# Patient Record
Sex: Female | Born: 1945
Health system: Southern US, Community
[De-identification: ages and names within clinical notes are randomized; demographics above are authoritative.]

## PROBLEM LIST (undated history)

## (undated) DIAGNOSIS — I77810 Thoracic aortic ectasia: Secondary | ICD-10-CM

## (undated) DIAGNOSIS — M1991 Primary osteoarthritis, unspecified site: Secondary | ICD-10-CM

## (undated) DIAGNOSIS — R251 Tremor, unspecified: Secondary | ICD-10-CM

## (undated) DIAGNOSIS — Z85828 Personal history of other malignant neoplasm of skin: Secondary | ICD-10-CM

## (undated) DIAGNOSIS — I10 Essential (primary) hypertension: Secondary | ICD-10-CM

## (undated) DIAGNOSIS — I517 Cardiomegaly: Secondary | ICD-10-CM

## (undated) DIAGNOSIS — I340 Nonrheumatic mitral (valve) insufficiency: Secondary | ICD-10-CM

## (undated) DIAGNOSIS — Z860100 Personal history of colon polyps, unspecified: Secondary | ICD-10-CM

## (undated) DIAGNOSIS — D508 Other iron deficiency anemias: Secondary | ICD-10-CM

## (undated) DIAGNOSIS — H905 Unspecified sensorineural hearing loss: Secondary | ICD-10-CM

## (undated) DIAGNOSIS — E042 Nontoxic multinodular goiter: Secondary | ICD-10-CM

## (undated) DIAGNOSIS — IMO0002 Reserved for concepts with insufficient information to code with codable children: Secondary | ICD-10-CM

## (undated) DIAGNOSIS — K9289 Other specified diseases of the digestive system: Secondary | ICD-10-CM

## (undated) DIAGNOSIS — I272 Pulmonary hypertension, unspecified: Secondary | ICD-10-CM

## (undated) DIAGNOSIS — K769 Liver disease, unspecified: Secondary | ICD-10-CM

## (undated) DIAGNOSIS — M419 Scoliosis, unspecified: Secondary | ICD-10-CM

## (undated) DIAGNOSIS — E559 Vitamin D deficiency, unspecified: Secondary | ICD-10-CM

## (undated) DIAGNOSIS — K760 Fatty (change of) liver, not elsewhere classified: Secondary | ICD-10-CM

## (undated) DIAGNOSIS — E7800 Pure hypercholesterolemia, unspecified: Secondary | ICD-10-CM

## (undated) DIAGNOSIS — N750 Cyst of Bartholin's gland: Secondary | ICD-10-CM

## (undated) DIAGNOSIS — I071 Rheumatic tricuspid insufficiency: Secondary | ICD-10-CM

## (undated) DIAGNOSIS — M859 Disorder of bone density and structure, unspecified: Secondary | ICD-10-CM

## (undated) DIAGNOSIS — H9190 Unspecified hearing loss, unspecified ear: Secondary | ICD-10-CM

## (undated) DIAGNOSIS — D649 Anemia, unspecified: Secondary | ICD-10-CM

## (undated) DIAGNOSIS — E669 Obesity, unspecified: Secondary | ICD-10-CM

## (undated) DIAGNOSIS — K5900 Constipation, unspecified: Secondary | ICD-10-CM

## (undated) DIAGNOSIS — Z8719 Personal history of other diseases of the digestive system: Secondary | ICD-10-CM

## (undated) HISTORY — DX: Other specified diseases of the digestive system: K92.89

## (undated) HISTORY — PX: CATARACT EXTRACTION: SUR2

## (undated) HISTORY — PX: BREAST REDUCTION SURGERY: SHX8

## (undated) HISTORY — PX: ROTATOR CUFF REPAIR: SHX139

## (undated) HISTORY — PX: HIATAL HERNIA REPAIR: SHX195

## (undated) HISTORY — PX: CARPAL TUNNEL RELEASE: SHX101

## (undated) HISTORY — PX: UMBILICAL HERNIA REPAIR: SHX196

## (undated) HISTORY — PX: STAPEDECTOMY: SHX2435

## (undated) HISTORY — PX: APPENDECTOMY: SHX54

## (undated) HISTORY — PX: BREAST SURGERY: SHX581

---

## 1997-07-20 ENCOUNTER — Other Ambulatory Visit: Admission: RE | Admit: 1997-07-20 | Discharge: 1997-07-20 | Payer: Self-pay | Admitting: *Deleted

## 1997-12-25 ENCOUNTER — Other Ambulatory Visit: Admission: RE | Admit: 1997-12-25 | Discharge: 1997-12-25 | Payer: Self-pay | Admitting: *Deleted

## 1998-02-06 ENCOUNTER — Other Ambulatory Visit: Admission: RE | Admit: 1998-02-06 | Discharge: 1998-02-06 | Payer: Self-pay | Admitting: *Deleted

## 1998-03-01 ENCOUNTER — Ambulatory Visit (HOSPITAL_COMMUNITY): Admission: RE | Admit: 1998-03-01 | Discharge: 1998-03-01 | Payer: Self-pay | Admitting: Gastroenterology

## 1999-02-07 ENCOUNTER — Other Ambulatory Visit: Admission: RE | Admit: 1999-02-07 | Discharge: 1999-02-07 | Payer: Self-pay | Admitting: *Deleted

## 2000-03-26 ENCOUNTER — Other Ambulatory Visit: Admission: RE | Admit: 2000-03-26 | Discharge: 2000-03-26 | Payer: Self-pay | Admitting: *Deleted

## 2001-02-23 ENCOUNTER — Encounter (INDEPENDENT_AMBULATORY_CARE_PROVIDER_SITE_OTHER): Payer: Self-pay | Admitting: Specialist

## 2001-02-23 ENCOUNTER — Ambulatory Visit (HOSPITAL_BASED_OUTPATIENT_CLINIC_OR_DEPARTMENT_OTHER): Admission: RE | Admit: 2001-02-23 | Discharge: 2001-02-23 | Payer: Self-pay | Admitting: Plastic Surgery

## 2001-03-17 ENCOUNTER — Other Ambulatory Visit: Admission: RE | Admit: 2001-03-17 | Discharge: 2001-03-17 | Payer: Self-pay | Admitting: *Deleted

## 2001-04-05 ENCOUNTER — Encounter (INDEPENDENT_AMBULATORY_CARE_PROVIDER_SITE_OTHER): Payer: Self-pay

## 2001-04-05 ENCOUNTER — Ambulatory Visit (HOSPITAL_COMMUNITY): Admission: RE | Admit: 2001-04-05 | Discharge: 2001-04-05 | Payer: Self-pay | Admitting: Gastroenterology

## 2002-04-21 ENCOUNTER — Other Ambulatory Visit: Admission: RE | Admit: 2002-04-21 | Discharge: 2002-04-21 | Payer: Self-pay | Admitting: *Deleted

## 2003-08-20 ENCOUNTER — Other Ambulatory Visit: Admission: RE | Admit: 2003-08-20 | Discharge: 2003-08-20 | Payer: Self-pay | Admitting: Family Medicine

## 2004-04-09 ENCOUNTER — Observation Stay (HOSPITAL_COMMUNITY): Admission: EM | Admit: 2004-04-09 | Discharge: 2004-04-10 | Payer: Self-pay | Admitting: Emergency Medicine

## 2005-10-15 ENCOUNTER — Other Ambulatory Visit: Admission: RE | Admit: 2005-10-15 | Discharge: 2005-10-15 | Payer: Self-pay | Admitting: *Deleted

## 2007-01-19 ENCOUNTER — Other Ambulatory Visit: Admission: RE | Admit: 2007-01-19 | Discharge: 2007-01-19 | Payer: Self-pay | Admitting: *Deleted

## 2008-04-04 ENCOUNTER — Emergency Department (HOSPITAL_COMMUNITY): Admission: EM | Admit: 2008-04-04 | Discharge: 2008-04-04 | Payer: Self-pay | Admitting: Emergency Medicine

## 2008-06-28 ENCOUNTER — Encounter: Admission: RE | Admit: 2008-06-28 | Discharge: 2008-06-28 | Payer: Self-pay | Admitting: Orthopedic Surgery

## 2008-08-21 ENCOUNTER — Other Ambulatory Visit: Admission: RE | Admit: 2008-08-21 | Discharge: 2008-08-21 | Payer: Self-pay | Admitting: Obstetrics and Gynecology

## 2010-05-15 LAB — D-DIMER, QUANTITATIVE: D-Dimer, Quant: 0.24 ug/mL-FEU (ref 0.00–0.48)

## 2010-05-15 LAB — DIFFERENTIAL
Basophils Absolute: 0 10*3/uL (ref 0.0–0.1)
Basophils Relative: 0 % (ref 0–1)
Eosinophils Absolute: 0.1 10*3/uL (ref 0.0–0.7)
Eosinophils Relative: 2 % (ref 0–5)
Lymphocytes Relative: 25 % (ref 12–46)
Lymphs Abs: 1 10*3/uL (ref 0.7–4.0)
Monocytes Absolute: 0.3 10*3/uL (ref 0.1–1.0)
Monocytes Relative: 8 % (ref 3–12)
Neutro Abs: 2.6 10*3/uL (ref 1.7–7.7)
Neutrophils Relative %: 65 % (ref 43–77)

## 2010-05-15 LAB — CBC
HCT: 36.4 % (ref 36.0–46.0)
Hemoglobin: 12.4 g/dL (ref 12.0–15.0)
MCHC: 34 g/dL (ref 30.0–36.0)
MCV: 92.6 fL (ref 78.0–100.0)
Platelets: 301 10*3/uL (ref 150–400)
RBC: 3.93 MIL/uL (ref 3.87–5.11)
RDW: 12.3 % (ref 11.5–15.5)
WBC: 4 10*3/uL (ref 4.0–10.5)

## 2010-05-15 LAB — POCT I-STAT, CHEM 8
BUN: 26 mg/dL — ABNORMAL HIGH (ref 6–23)
Chloride: 107 mEq/L (ref 96–112)
Glucose, Bld: 100 mg/dL — ABNORMAL HIGH (ref 70–99)
Sodium: 140 mEq/L (ref 135–145)
TCO2: 24 mmol/L (ref 0–100)

## 2010-06-20 NOTE — H&P (Signed)
[name], [name]              ACCOUNT NO.:  [id number]   MEDICAL RECORD NO.:  [id number]          PATIENT TYPE:  EMS   LOCATION:  [facility]                         FACILITY:  [facility]   PHYSICIAN:  Hollice Espy, M.D.DATE OF BIRTH:  10/01/1945   DATE OF ADMISSION:  04/09/2004  DATE OF DISCHARGE:                                HISTORY & PHYSICAL   PRIMARY CARE PHYSICIAN:  Al Decant. Janey Greaser, M.D.   CHIEF COMPLAINT:  Chest discomfort.   HISTORY OF PRESENT ILLNESS:  The patient is a 65 year old white female with  past medical history of GERD, who a night prior to admission was  experiencing some burning in her chest which was relieved after taking  Rolaids.  She went to bed and this morning she woke up with a different  sensation in her chest and more of a chest tightness, somewhat described as  pressure causing some shortness of breath.  She felt extremely lightheaded  and came into the emergency room for further evaluation.  Here in the  emergency room, she had an EKG done which showed normal sinus rhythm, no ST  or T wave changes.  Cardiac enzymes were drawn and found to be normal as  well as were ISTAT labs.  The patient was given nitroglycerin, oxygen, and  aspirin.  She said her symptoms resolved, although, she could not say which  intervention caused the relief.  Currently she states she is doing well.  She has no chest discomfort or chest tightness.  Her only complaint is of a  headache caused by the nitroglycerin.  She denies any visual changes,  nausea, vomiting, chest pain, palpitations, shortness of breath, wheeze,  cough, abdominal pain, hematuria, dysuria, constipation, diarrhea, focal  extremity pain, weakness, or numbness.  Review of systems is otherwise  negative.   PAST MEDICAL HISTORY:  Notable for GERD, otherwise unremarkable.   MEDICATIONS:  None.   ALLERGIES:  No known drug allergies.   SOCIAL HISTORY:  She denies any alcohol, tobacco, or drug use.   FAMILY HISTORY:  Father who had an MI at age 55.   PHYSICAL EXAMINATION:  VITAL SIGNS:  Temperature 96.8, respirations 20,  heart rate 79, blood pressure 139/88, O2 saturation 98% on room air.  GENERAL:  The patient appears to be alert and oriented x3.  In no acute  distress.  HEENT:  Normocephalic and atraumatic.  Mucous membranes are moist.  She has  no carotid bruits.  HEART:  Regular rate and rhythm, S1 and S2.  LUNGS:  Clear to auscultation bilaterally.  ABDOMEN:  Soft, nontender, and nondistended.  Positive bowel sounds.  EXTREMITIES:  No cyanosis, clubbing, or edema.   LABORATORY DATA:  White count 5.9, H&H 12.9 and 36.8, MCV 90, platelet count  303.  CMET is completely normal.  Normal electrolytes and normal LFT's.  PT,  PTT, and INR are all normal.  First set of cardiac markers 81.7, MB 1.6,  second 72.9 and MB 1.6.  Troponin I less than 0.05.   Chest x-ray shows evidence of mild cardiomegaly.  The patient notes that she  follows up  regularly with her PCP, has had her cholesterol checked and has  been told that it is low and has had her blood pressure checked often and is  always found to be normal.   ASSESSMENT:  Chest discomfort.  We will rule out cardiac etiology by  checking two more sets of cardiac enzymes.  EKG is normal sinus rhythm.  I  have a low index of suspicion despite her age given the patient's frequent  checkups and no other risk factors.  If her cardiac enzymes is negative  would favor discharging home with an outpatient stress test, but will see  how she does in the morning.      SKK/MEDQ  D:  04/09/2004  T:  04/10/2004  Job:  [id number]   cc:   Al Decant. Janey Greaser, MD  [address]  [place]  [place] [address]  Fax: [phone]

## 2010-07-21 ENCOUNTER — Other Ambulatory Visit: Payer: Self-pay | Admitting: Dermatology

## 2010-10-14 ENCOUNTER — Other Ambulatory Visit: Payer: Self-pay | Admitting: Dermatology

## 2011-02-09 DIAGNOSIS — M949 Disorder of cartilage, unspecified: Secondary | ICD-10-CM | POA: Diagnosis not present

## 2011-02-09 DIAGNOSIS — M899 Disorder of bone, unspecified: Secondary | ICD-10-CM | POA: Diagnosis not present

## 2011-02-11 ENCOUNTER — Other Ambulatory Visit: Payer: Self-pay | Admitting: Dermatology

## 2011-02-11 DIAGNOSIS — C44529 Squamous cell carcinoma of skin of other part of trunk: Secondary | ICD-10-CM | POA: Diagnosis not present

## 2011-02-11 DIAGNOSIS — L570 Actinic keratosis: Secondary | ICD-10-CM | POA: Diagnosis not present

## 2011-02-11 DIAGNOSIS — L821 Other seborrheic keratosis: Secondary | ICD-10-CM | POA: Diagnosis not present

## 2011-02-18 ENCOUNTER — Other Ambulatory Visit: Payer: Self-pay | Admitting: Dermatology

## 2011-02-18 DIAGNOSIS — L570 Actinic keratosis: Secondary | ICD-10-CM | POA: Diagnosis not present

## 2011-02-18 DIAGNOSIS — D045 Carcinoma in situ of skin of trunk: Secondary | ICD-10-CM | POA: Diagnosis not present

## 2011-02-18 DIAGNOSIS — C44529 Squamous cell carcinoma of skin of other part of trunk: Secondary | ICD-10-CM | POA: Diagnosis not present

## 2011-08-12 ENCOUNTER — Other Ambulatory Visit: Payer: Self-pay | Admitting: Dermatology

## 2011-08-12 DIAGNOSIS — L570 Actinic keratosis: Secondary | ICD-10-CM | POA: Diagnosis not present

## 2011-08-12 DIAGNOSIS — L821 Other seborrheic keratosis: Secondary | ICD-10-CM | POA: Diagnosis not present

## 2011-08-12 DIAGNOSIS — D485 Neoplasm of uncertain behavior of skin: Secondary | ICD-10-CM | POA: Diagnosis not present

## 2011-08-12 DIAGNOSIS — C44519 Basal cell carcinoma of skin of other part of trunk: Secondary | ICD-10-CM | POA: Diagnosis not present

## 2011-09-18 DIAGNOSIS — H1030 Unspecified acute conjunctivitis, unspecified eye: Secondary | ICD-10-CM | POA: Diagnosis not present

## 2011-09-24 DIAGNOSIS — H8000 Otosclerosis involving oval window, nonobliterative, unspecified ear: Secondary | ICD-10-CM | POA: Diagnosis not present

## 2011-09-24 DIAGNOSIS — H908 Mixed conductive and sensorineural hearing loss, unspecified: Secondary | ICD-10-CM | POA: Diagnosis not present

## 2011-09-24 DIAGNOSIS — H905 Unspecified sensorineural hearing loss: Secondary | ICD-10-CM | POA: Diagnosis not present

## 2011-11-04 DIAGNOSIS — Z23 Encounter for immunization: Secondary | ICD-10-CM | POA: Diagnosis not present

## 2011-11-24 DIAGNOSIS — L570 Actinic keratosis: Secondary | ICD-10-CM | POA: Diagnosis not present

## 2011-12-09 DIAGNOSIS — H2589 Other age-related cataract: Secondary | ICD-10-CM | POA: Diagnosis not present

## 2011-12-09 DIAGNOSIS — H524 Presbyopia: Secondary | ICD-10-CM | POA: Diagnosis not present

## 2011-12-09 DIAGNOSIS — H52229 Regular astigmatism, unspecified eye: Secondary | ICD-10-CM | POA: Diagnosis not present

## 2011-12-09 DIAGNOSIS — H5200 Hypermetropia, unspecified eye: Secondary | ICD-10-CM | POA: Diagnosis not present

## 2012-02-16 ENCOUNTER — Other Ambulatory Visit: Payer: Self-pay | Admitting: Dermatology

## 2012-02-16 DIAGNOSIS — L821 Other seborrheic keratosis: Secondary | ICD-10-CM | POA: Diagnosis not present

## 2012-02-16 DIAGNOSIS — L570 Actinic keratosis: Secondary | ICD-10-CM | POA: Diagnosis not present

## 2012-02-16 DIAGNOSIS — D1801 Hemangioma of skin and subcutaneous tissue: Secondary | ICD-10-CM | POA: Diagnosis not present

## 2012-02-16 DIAGNOSIS — D485 Neoplasm of uncertain behavior of skin: Secondary | ICD-10-CM | POA: Diagnosis not present

## 2012-02-22 DIAGNOSIS — J019 Acute sinusitis, unspecified: Secondary | ICD-10-CM | POA: Diagnosis not present

## 2012-02-22 DIAGNOSIS — J209 Acute bronchitis, unspecified: Secondary | ICD-10-CM | POA: Diagnosis not present

## 2012-04-07 ENCOUNTER — Other Ambulatory Visit: Payer: Self-pay | Admitting: Family Medicine

## 2012-04-07 ENCOUNTER — Other Ambulatory Visit (HOSPITAL_COMMUNITY)
Admission: RE | Admit: 2012-04-07 | Discharge: 2012-04-07 | Disposition: A | Discharge status: Home / Self Care | Payer: Medicare Other | Source: Ambulatory Visit | Attending: Family Medicine | Admitting: Family Medicine

## 2012-04-07 DIAGNOSIS — Z131 Encounter for screening for diabetes mellitus: Secondary | ICD-10-CM | POA: Diagnosis not present

## 2012-04-07 DIAGNOSIS — E7800 Pure hypercholesterolemia, unspecified: Secondary | ICD-10-CM | POA: Diagnosis not present

## 2012-04-07 DIAGNOSIS — Z124 Encounter for screening for malignant neoplasm of cervix: Secondary | ICD-10-CM | POA: Insufficient documentation

## 2012-04-07 DIAGNOSIS — Z1151 Encounter for screening for human papillomavirus (HPV): Secondary | ICD-10-CM | POA: Insufficient documentation

## 2012-04-07 DIAGNOSIS — M949 Disorder of cartilage, unspecified: Secondary | ICD-10-CM | POA: Diagnosis not present

## 2012-04-07 DIAGNOSIS — Z0000 Encounter for general adult medical examination without abnormal findings: Secondary | ICD-10-CM | POA: Diagnosis not present

## 2012-04-07 DIAGNOSIS — E663 Overweight: Secondary | ICD-10-CM | POA: Diagnosis not present

## 2012-04-07 DIAGNOSIS — M899 Disorder of bone, unspecified: Secondary | ICD-10-CM | POA: Diagnosis not present

## 2012-06-28 DIAGNOSIS — M5430 Sciatica, unspecified side: Secondary | ICD-10-CM | POA: Diagnosis not present

## 2012-06-28 DIAGNOSIS — M999 Biomechanical lesion, unspecified: Secondary | ICD-10-CM | POA: Diagnosis not present

## 2012-06-29 DIAGNOSIS — M5430 Sciatica, unspecified side: Secondary | ICD-10-CM | POA: Diagnosis not present

## 2012-06-29 DIAGNOSIS — M999 Biomechanical lesion, unspecified: Secondary | ICD-10-CM | POA: Diagnosis not present

## 2012-06-30 DIAGNOSIS — M5430 Sciatica, unspecified side: Secondary | ICD-10-CM | POA: Diagnosis not present

## 2012-06-30 DIAGNOSIS — M999 Biomechanical lesion, unspecified: Secondary | ICD-10-CM | POA: Diagnosis not present

## 2012-07-04 DIAGNOSIS — M999 Biomechanical lesion, unspecified: Secondary | ICD-10-CM | POA: Diagnosis not present

## 2012-07-04 DIAGNOSIS — M5430 Sciatica, unspecified side: Secondary | ICD-10-CM | POA: Diagnosis not present

## 2012-07-05 DIAGNOSIS — M5430 Sciatica, unspecified side: Secondary | ICD-10-CM | POA: Diagnosis not present

## 2012-07-05 DIAGNOSIS — M999 Biomechanical lesion, unspecified: Secondary | ICD-10-CM | POA: Diagnosis not present

## 2012-07-06 DIAGNOSIS — M5430 Sciatica, unspecified side: Secondary | ICD-10-CM | POA: Diagnosis not present

## 2012-07-06 DIAGNOSIS — M999 Biomechanical lesion, unspecified: Secondary | ICD-10-CM | POA: Diagnosis not present

## 2012-07-11 DIAGNOSIS — M999 Biomechanical lesion, unspecified: Secondary | ICD-10-CM | POA: Diagnosis not present

## 2012-07-11 DIAGNOSIS — M5430 Sciatica, unspecified side: Secondary | ICD-10-CM | POA: Diagnosis not present

## 2012-07-12 DIAGNOSIS — M999 Biomechanical lesion, unspecified: Secondary | ICD-10-CM | POA: Diagnosis not present

## 2012-07-12 DIAGNOSIS — M5430 Sciatica, unspecified side: Secondary | ICD-10-CM | POA: Diagnosis not present

## 2012-07-14 DIAGNOSIS — M5430 Sciatica, unspecified side: Secondary | ICD-10-CM | POA: Diagnosis not present

## 2012-07-14 DIAGNOSIS — M999 Biomechanical lesion, unspecified: Secondary | ICD-10-CM | POA: Diagnosis not present

## 2012-07-18 DIAGNOSIS — M5430 Sciatica, unspecified side: Secondary | ICD-10-CM | POA: Diagnosis not present

## 2012-07-18 DIAGNOSIS — M999 Biomechanical lesion, unspecified: Secondary | ICD-10-CM | POA: Diagnosis not present

## 2012-07-19 DIAGNOSIS — M5430 Sciatica, unspecified side: Secondary | ICD-10-CM | POA: Diagnosis not present

## 2012-07-19 DIAGNOSIS — M999 Biomechanical lesion, unspecified: Secondary | ICD-10-CM | POA: Diagnosis not present

## 2012-07-20 DIAGNOSIS — M5430 Sciatica, unspecified side: Secondary | ICD-10-CM | POA: Diagnosis not present

## 2012-07-20 DIAGNOSIS — M999 Biomechanical lesion, unspecified: Secondary | ICD-10-CM | POA: Diagnosis not present

## 2012-08-01 DIAGNOSIS — M999 Biomechanical lesion, unspecified: Secondary | ICD-10-CM | POA: Diagnosis not present

## 2012-08-01 DIAGNOSIS — M5430 Sciatica, unspecified side: Secondary | ICD-10-CM | POA: Diagnosis not present

## 2012-08-08 DIAGNOSIS — M5430 Sciatica, unspecified side: Secondary | ICD-10-CM | POA: Diagnosis not present

## 2012-08-08 DIAGNOSIS — M999 Biomechanical lesion, unspecified: Secondary | ICD-10-CM | POA: Diagnosis not present

## 2012-08-16 ENCOUNTER — Other Ambulatory Visit: Payer: Self-pay | Admitting: Dermatology

## 2012-08-16 DIAGNOSIS — L821 Other seborrheic keratosis: Secondary | ICD-10-CM | POA: Diagnosis not present

## 2012-08-16 DIAGNOSIS — D485 Neoplasm of uncertain behavior of skin: Secondary | ICD-10-CM | POA: Diagnosis not present

## 2012-08-16 DIAGNOSIS — Z85828 Personal history of other malignant neoplasm of skin: Secondary | ICD-10-CM | POA: Diagnosis not present

## 2012-08-16 DIAGNOSIS — D0460 Carcinoma in situ of skin of unspecified upper limb, including shoulder: Secondary | ICD-10-CM | POA: Diagnosis not present

## 2012-08-16 DIAGNOSIS — L723 Sebaceous cyst: Secondary | ICD-10-CM | POA: Diagnosis not present

## 2012-08-16 DIAGNOSIS — L570 Actinic keratosis: Secondary | ICD-10-CM | POA: Diagnosis not present

## 2012-08-16 DIAGNOSIS — D044 Carcinoma in situ of skin of scalp and neck: Secondary | ICD-10-CM | POA: Diagnosis not present

## 2012-10-05 DIAGNOSIS — R0609 Other forms of dyspnea: Secondary | ICD-10-CM | POA: Diagnosis not present

## 2012-10-07 ENCOUNTER — Other Ambulatory Visit: Payer: Self-pay | Admitting: Family Medicine

## 2012-10-07 ENCOUNTER — Ambulatory Visit
Admission: RE | Admit: 2012-10-07 | Discharge: 2012-10-07 | Disposition: A | Discharge status: Home / Self Care | Payer: Medicare Other | Source: Ambulatory Visit | Attending: Family Medicine | Admitting: Family Medicine

## 2012-10-07 DIAGNOSIS — R7989 Other specified abnormal findings of blood chemistry: Secondary | ICD-10-CM

## 2012-10-07 DIAGNOSIS — R0602 Shortness of breath: Secondary | ICD-10-CM

## 2012-10-07 DIAGNOSIS — I2510 Atherosclerotic heart disease of native coronary artery without angina pectoris: Secondary | ICD-10-CM | POA: Diagnosis not present

## 2012-10-07 MED ORDER — IOHEXOL 350 MG/ML SOLN
125.0000 mL | Freq: Once | INTRAVENOUS | Status: AC | PRN
  Administered 2012-10-07: 125 mL via INTRAVENOUS

## 2012-10-11 DIAGNOSIS — Z1211 Encounter for screening for malignant neoplasm of colon: Secondary | ICD-10-CM | POA: Diagnosis not present

## 2012-10-21 DIAGNOSIS — D509 Iron deficiency anemia, unspecified: Secondary | ICD-10-CM | POA: Diagnosis not present

## 2012-10-21 DIAGNOSIS — R195 Other fecal abnormalities: Secondary | ICD-10-CM | POA: Diagnosis not present

## 2012-10-21 DIAGNOSIS — Z0000 Encounter for general adult medical examination without abnormal findings: Secondary | ICD-10-CM | POA: Diagnosis not present

## 2012-10-24 DIAGNOSIS — K921 Melena: Secondary | ICD-10-CM | POA: Diagnosis not present

## 2012-10-24 DIAGNOSIS — D509 Iron deficiency anemia, unspecified: Secondary | ICD-10-CM | POA: Diagnosis not present

## 2012-10-24 DIAGNOSIS — R195 Other fecal abnormalities: Secondary | ICD-10-CM | POA: Diagnosis not present

## 2012-10-24 DIAGNOSIS — K259 Gastric ulcer, unspecified as acute or chronic, without hemorrhage or perforation: Secondary | ICD-10-CM | POA: Diagnosis not present

## 2012-10-24 DIAGNOSIS — K2970 Gastritis, unspecified, without bleeding: Secondary | ICD-10-CM | POA: Diagnosis not present

## 2012-10-24 DIAGNOSIS — D649 Anemia, unspecified: Secondary | ICD-10-CM | POA: Diagnosis not present

## 2012-10-24 DIAGNOSIS — K2940 Chronic atrophic gastritis without bleeding: Secondary | ICD-10-CM | POA: Diagnosis not present

## 2012-11-09 DIAGNOSIS — H908 Mixed conductive and sensorineural hearing loss, unspecified: Secondary | ICD-10-CM | POA: Diagnosis not present

## 2012-11-09 DIAGNOSIS — H905 Unspecified sensorineural hearing loss: Secondary | ICD-10-CM | POA: Diagnosis not present

## 2012-11-09 DIAGNOSIS — H8000 Otosclerosis involving oval window, nonobliterative, unspecified ear: Secondary | ICD-10-CM | POA: Diagnosis not present

## 2012-11-09 DIAGNOSIS — H6120 Impacted cerumen, unspecified ear: Secondary | ICD-10-CM | POA: Diagnosis not present

## 2012-11-28 DIAGNOSIS — D509 Iron deficiency anemia, unspecified: Secondary | ICD-10-CM | POA: Diagnosis not present

## 2012-11-29 DIAGNOSIS — Z23 Encounter for immunization: Secondary | ICD-10-CM | POA: Diagnosis not present

## 2012-12-27 DIAGNOSIS — D509 Iron deficiency anemia, unspecified: Secondary | ICD-10-CM | POA: Diagnosis not present

## 2013-01-09 DIAGNOSIS — K253 Acute gastric ulcer without hemorrhage or perforation: Secondary | ICD-10-CM | POA: Diagnosis not present

## 2013-01-09 DIAGNOSIS — Z8719 Personal history of other diseases of the digestive system: Secondary | ICD-10-CM | POA: Diagnosis not present

## 2013-01-09 DIAGNOSIS — K259 Gastric ulcer, unspecified as acute or chronic, without hemorrhage or perforation: Secondary | ICD-10-CM | POA: Diagnosis not present

## 2013-02-14 DIAGNOSIS — Z85828 Personal history of other malignant neoplasm of skin: Secondary | ICD-10-CM | POA: Diagnosis not present

## 2013-02-14 DIAGNOSIS — L909 Atrophic disorder of skin, unspecified: Secondary | ICD-10-CM | POA: Diagnosis not present

## 2013-02-14 DIAGNOSIS — L570 Actinic keratosis: Secondary | ICD-10-CM | POA: Diagnosis not present

## 2013-02-14 DIAGNOSIS — L821 Other seborrheic keratosis: Secondary | ICD-10-CM | POA: Diagnosis not present

## 2013-02-14 DIAGNOSIS — L820 Inflamed seborrheic keratosis: Secondary | ICD-10-CM | POA: Diagnosis not present

## 2013-03-29 ENCOUNTER — Other Ambulatory Visit: Payer: Self-pay | Admitting: Dermatology

## 2013-03-29 DIAGNOSIS — C44319 Basal cell carcinoma of skin of other parts of face: Secondary | ICD-10-CM | POA: Diagnosis not present

## 2013-03-29 DIAGNOSIS — C44721 Squamous cell carcinoma of skin of unspecified lower limb, including hip: Secondary | ICD-10-CM | POA: Diagnosis not present

## 2013-03-29 DIAGNOSIS — L570 Actinic keratosis: Secondary | ICD-10-CM | POA: Diagnosis not present

## 2013-03-29 DIAGNOSIS — D485 Neoplasm of uncertain behavior of skin: Secondary | ICD-10-CM | POA: Diagnosis not present

## 2013-04-11 DIAGNOSIS — C44319 Basal cell carcinoma of skin of other parts of face: Secondary | ICD-10-CM | POA: Diagnosis not present

## 2013-04-11 DIAGNOSIS — Z85828 Personal history of other malignant neoplasm of skin: Secondary | ICD-10-CM | POA: Diagnosis not present

## 2013-04-13 DIAGNOSIS — H52229 Regular astigmatism, unspecified eye: Secondary | ICD-10-CM | POA: Diagnosis not present

## 2013-04-13 DIAGNOSIS — H35369 Drusen (degenerative) of macula, unspecified eye: Secondary | ICD-10-CM | POA: Diagnosis not present

## 2013-04-13 DIAGNOSIS — H2589 Other age-related cataract: Secondary | ICD-10-CM | POA: Diagnosis not present

## 2013-04-13 DIAGNOSIS — H5200 Hypermetropia, unspecified eye: Secondary | ICD-10-CM | POA: Diagnosis not present

## 2013-06-14 DIAGNOSIS — M949 Disorder of cartilage, unspecified: Secondary | ICD-10-CM | POA: Diagnosis not present

## 2013-06-14 DIAGNOSIS — Z1231 Encounter for screening mammogram for malignant neoplasm of breast: Secondary | ICD-10-CM | POA: Diagnosis not present

## 2013-06-14 DIAGNOSIS — M899 Disorder of bone, unspecified: Secondary | ICD-10-CM | POA: Diagnosis not present

## 2013-06-14 DIAGNOSIS — Z803 Family history of malignant neoplasm of breast: Secondary | ICD-10-CM | POA: Diagnosis not present

## 2013-06-30 DIAGNOSIS — E559 Vitamin D deficiency, unspecified: Secondary | ICD-10-CM | POA: Diagnosis not present

## 2013-06-30 DIAGNOSIS — Z131 Encounter for screening for diabetes mellitus: Secondary | ICD-10-CM | POA: Diagnosis not present

## 2013-06-30 DIAGNOSIS — E7800 Pure hypercholesterolemia, unspecified: Secondary | ICD-10-CM | POA: Diagnosis not present

## 2013-07-05 DIAGNOSIS — Z0000 Encounter for general adult medical examination without abnormal findings: Secondary | ICD-10-CM | POA: Diagnosis not present

## 2013-07-05 DIAGNOSIS — M899 Disorder of bone, unspecified: Secondary | ICD-10-CM | POA: Diagnosis not present

## 2013-07-05 DIAGNOSIS — E663 Overweight: Secondary | ICD-10-CM | POA: Diagnosis not present

## 2013-07-05 DIAGNOSIS — D649 Anemia, unspecified: Secondary | ICD-10-CM | POA: Diagnosis not present

## 2013-07-05 DIAGNOSIS — Z23 Encounter for immunization: Secondary | ICD-10-CM | POA: Diagnosis not present

## 2013-08-15 ENCOUNTER — Other Ambulatory Visit: Payer: Self-pay | Admitting: Dermatology

## 2013-08-15 DIAGNOSIS — D1801 Hemangioma of skin and subcutaneous tissue: Secondary | ICD-10-CM | POA: Diagnosis not present

## 2013-08-15 DIAGNOSIS — C4441 Basal cell carcinoma of skin of scalp and neck: Secondary | ICD-10-CM | POA: Diagnosis not present

## 2013-08-15 DIAGNOSIS — B353 Tinea pedis: Secondary | ICD-10-CM | POA: Diagnosis not present

## 2013-08-15 DIAGNOSIS — L570 Actinic keratosis: Secondary | ICD-10-CM | POA: Diagnosis not present

## 2013-08-15 DIAGNOSIS — L821 Other seborrheic keratosis: Secondary | ICD-10-CM | POA: Diagnosis not present

## 2013-08-15 DIAGNOSIS — D485 Neoplasm of uncertain behavior of skin: Secondary | ICD-10-CM | POA: Diagnosis not present

## 2013-08-15 DIAGNOSIS — Z85828 Personal history of other malignant neoplasm of skin: Secondary | ICD-10-CM | POA: Diagnosis not present

## 2013-08-19 DIAGNOSIS — B309 Viral conjunctivitis, unspecified: Secondary | ICD-10-CM | POA: Diagnosis not present

## 2013-09-07 DIAGNOSIS — C44319 Basal cell carcinoma of skin of other parts of face: Secondary | ICD-10-CM | POA: Diagnosis not present

## 2013-09-07 DIAGNOSIS — Z85828 Personal history of other malignant neoplasm of skin: Secondary | ICD-10-CM | POA: Diagnosis not present

## 2013-11-18 DIAGNOSIS — Z23 Encounter for immunization: Secondary | ICD-10-CM | POA: Diagnosis not present

## 2013-12-15 DIAGNOSIS — H2513 Age-related nuclear cataract, bilateral: Secondary | ICD-10-CM | POA: Diagnosis not present

## 2013-12-25 DIAGNOSIS — D649 Anemia, unspecified: Secondary | ICD-10-CM | POA: Diagnosis not present

## 2013-12-25 DIAGNOSIS — K098 Other cysts of oral region, not elsewhere classified: Secondary | ICD-10-CM | POA: Diagnosis not present

## 2013-12-27 DIAGNOSIS — H6123 Impacted cerumen, bilateral: Secondary | ICD-10-CM | POA: Diagnosis not present

## 2013-12-27 DIAGNOSIS — H903 Sensorineural hearing loss, bilateral: Secondary | ICD-10-CM | POA: Diagnosis not present

## 2013-12-27 DIAGNOSIS — H8003 Otosclerosis involving oval window, nonobliterative, bilateral: Secondary | ICD-10-CM | POA: Diagnosis not present

## 2014-02-20 DIAGNOSIS — H6123 Impacted cerumen, bilateral: Secondary | ICD-10-CM | POA: Diagnosis not present

## 2014-02-20 DIAGNOSIS — H903 Sensorineural hearing loss, bilateral: Secondary | ICD-10-CM | POA: Diagnosis not present

## 2014-02-20 DIAGNOSIS — H8003 Otosclerosis involving oval window, nonobliterative, bilateral: Secondary | ICD-10-CM | POA: Diagnosis not present

## 2014-02-21 DIAGNOSIS — L570 Actinic keratosis: Secondary | ICD-10-CM | POA: Diagnosis not present

## 2014-02-21 DIAGNOSIS — L821 Other seborrheic keratosis: Secondary | ICD-10-CM | POA: Diagnosis not present

## 2014-02-21 DIAGNOSIS — Z85828 Personal history of other malignant neoplasm of skin: Secondary | ICD-10-CM | POA: Diagnosis not present

## 2014-03-23 DIAGNOSIS — J069 Acute upper respiratory infection, unspecified: Secondary | ICD-10-CM | POA: Diagnosis not present

## 2014-03-23 DIAGNOSIS — R05 Cough: Secondary | ICD-10-CM | POA: Diagnosis not present

## 2014-06-28 DIAGNOSIS — Z1231 Encounter for screening mammogram for malignant neoplasm of breast: Secondary | ICD-10-CM | POA: Diagnosis not present

## 2014-06-28 DIAGNOSIS — Z803 Family history of malignant neoplasm of breast: Secondary | ICD-10-CM | POA: Diagnosis not present

## 2014-07-05 DIAGNOSIS — H903 Sensorineural hearing loss, bilateral: Secondary | ICD-10-CM | POA: Diagnosis not present

## 2014-07-05 DIAGNOSIS — H6123 Impacted cerumen, bilateral: Secondary | ICD-10-CM | POA: Diagnosis not present

## 2014-07-05 DIAGNOSIS — H8003 Otosclerosis involving oval window, nonobliterative, bilateral: Secondary | ICD-10-CM | POA: Diagnosis not present

## 2014-07-19 DIAGNOSIS — Z131 Encounter for screening for diabetes mellitus: Secondary | ICD-10-CM | POA: Diagnosis not present

## 2014-07-19 DIAGNOSIS — E559 Vitamin D deficiency, unspecified: Secondary | ICD-10-CM | POA: Diagnosis not present

## 2014-07-25 DIAGNOSIS — Z136 Encounter for screening for cardiovascular disorders: Secondary | ICD-10-CM | POA: Diagnosis not present

## 2014-07-25 DIAGNOSIS — H9190 Unspecified hearing loss, unspecified ear: Secondary | ICD-10-CM | POA: Diagnosis not present

## 2014-07-25 DIAGNOSIS — E669 Obesity, unspecified: Secondary | ICD-10-CM | POA: Diagnosis not present

## 2014-07-25 DIAGNOSIS — E559 Vitamin D deficiency, unspecified: Secondary | ICD-10-CM | POA: Diagnosis not present

## 2014-07-25 DIAGNOSIS — Z0000 Encounter for general adult medical examination without abnormal findings: Secondary | ICD-10-CM | POA: Diagnosis not present

## 2014-07-25 DIAGNOSIS — M859 Disorder of bone density and structure, unspecified: Secondary | ICD-10-CM | POA: Diagnosis not present

## 2014-08-15 DIAGNOSIS — S2020XA Contusion of thorax, unspecified, initial encounter: Secondary | ICD-10-CM | POA: Diagnosis not present

## 2014-08-15 DIAGNOSIS — S300XXA Contusion of lower back and pelvis, initial encounter: Secondary | ICD-10-CM | POA: Diagnosis not present

## 2014-08-16 ENCOUNTER — Other Ambulatory Visit: Payer: Self-pay | Admitting: Sports Medicine

## 2014-08-16 DIAGNOSIS — H8003 Otosclerosis involving oval window, nonobliterative, bilateral: Secondary | ICD-10-CM | POA: Diagnosis not present

## 2014-08-16 DIAGNOSIS — S2020XA Contusion of thorax, unspecified, initial encounter: Secondary | ICD-10-CM

## 2014-08-16 DIAGNOSIS — H903 Sensorineural hearing loss, bilateral: Secondary | ICD-10-CM | POA: Diagnosis not present

## 2014-08-16 DIAGNOSIS — H6123 Impacted cerumen, bilateral: Secondary | ICD-10-CM | POA: Diagnosis not present

## 2014-08-17 ENCOUNTER — Ambulatory Visit
Admission: RE | Admit: 2014-08-17 | Discharge: 2014-08-17 | Disposition: A | Discharge status: Home / Self Care | Payer: Medicare Other | Source: Ambulatory Visit | Attending: Sports Medicine | Admitting: Sports Medicine

## 2014-08-17 ENCOUNTER — Encounter (HOSPITAL_BASED_OUTPATIENT_CLINIC_OR_DEPARTMENT_OTHER): Payer: Self-pay | Admitting: *Deleted

## 2014-08-17 DIAGNOSIS — S2020XA Contusion of thorax, unspecified, initial encounter: Secondary | ICD-10-CM

## 2014-08-17 DIAGNOSIS — S22050A Wedge compression fracture of T5-T6 vertebra, initial encounter for closed fracture: Secondary | ICD-10-CM | POA: Diagnosis not present

## 2014-08-19 NOTE — H&P (Addendum)
[name] is an 69 y.o. female.   Chief Complaint: Moderately severe to severe mixed hearing loss, right ear, s/p failed bilateral stapedectomies elsewhere. Moderate to moderately severe sensorineural hearing loss, left ear  HPI: See H & P below  History & Physical Examination   Patient: [name]  Provider: Vicie Mutters, MD, MS, FACS  Date of Service:  Aug 16, 2014  Location: [facility], [place], [place]                  [place], [place]   [id number]                                Ph: [phone], Fax: [phone]                  [email or web address]     Provider: Vicie Mutters, MD, MS, FACS Encounter Date: Aug 16, 2014  Patient: Hannah, [name]    ([id number]) Sex: Female       DOB: 1945/10/18      Age: 50 year 5 month       Race: White Address: [place],  [place]  [place]  [address] [address] [place]: MEDICARE  Referred By:  Vicie Mutters   Visit Type: [name], a 54 year 75 month White female is here today for a pre-operative visit.  Complaint/HPI: The patient was here today for a preoperative evaluation prior to undergoing a right Baha hearing implant to treat moderately severe to severe mixed hearing loss, right ear, following a failed stapedectomy, elsewhere. The patient fell yesterday while wearing socks on wooden steps. She fell from a height of four steps onto her back. She has seen an orthopedic surgeon who has ordered a CT scan of her back. That is scheduled for Monday morning, August 20, 2014. She is not having any particular breathing difficulty, but is having some trouble taking a deep breath. She denied any fever or other symptoms.  Previous Hx: The patient was here today for follow-up of her hearing and for cerumen impaction removal. She has undergone bilateral stapedectomies elsewhere, with poor results bilaterally, especially the right ear which is essentially a  dead ear. She has no complaints of otorrhea or otalgia. She is unable to localize.  Previous history: The patient was here today for bilateral cerumen impaction removal. Prior to traveling to [place] for five weeks. She is not hearing well from her right ear. She is status post bilateral stapedectomies elsewhere. She has poor discrimination in the right ear. She denied otorrhea or otalgia.  Previous history: The patient was here today for cerumen removal AU. She is wearing binaural digital BTE hearing aids with success. She's planning a trip to [place]. He denies otorrhea, otalgia, or vertigo. This is her routine six month checkup  Previous history: The patient was here today for evaluation of her ears and hearing. She has undergone bilateral stapedectomies elsewhere. She is wearing binaural hearing aids and feels that the right hearing aid does help her. She denies otorrhea, otalgia, or vertigo.   Current Medication: Patient is not taking any medication.  Medical History: Vaccinations: Flu vaccinations: Yes, flu shot - since Nov 02, 2013 - code [id number].  Pneumonia vaccination: Yes - code 4040F.  Ear Operations: . Stapedectomy.  Surgical History: Prior surgeries include appendectomy, breast surgery,  carpal tunnel release ,  hernia repair  and rotator cuff repair.  Family History: The patient has a family history of Hearing loss.  Social History: Smoking: Her current smoking status is never smoker/non-smoker. Alcohol: Patient drinks alcoholic beverages. Marital Status: Patient is married. HIV status: (-) HIV status. Recreational Drug Use: She denies recreational drug use.  Allergy:  No Known Drug Allergies  ROS: General: (-) fever, (-) chills, (-) night sweats, (-) fatigue, (-) weakness, (-) changes in appetite or weight. (-) allergies, (-) not immunocompromised. Head: (-) headaches, (-) head injury or deformity. Eyes: (-) visual changes, (-) eye pain, (-) eye discharges, (-)  redness, (-) itching, (-) excessive tearing, (-) double or blurred vision, (-) glaucoma, (-) cataracts. Ears: (+) hearing loss. Nose and Sinuses: (-) frequent colds, (-) nasal stuffiness or itchiness, (-) postnasal drip, (-) hay fever, (-) nosebleeds, (-) sinus trouble. Mouth and Throat: (-) bleeding gums, (-) toothache, (-) odd taste sensations, (-) sores on tongue, (-) frequent sore throat, (-) hoarseness. Neck: (-) swollen glands, (-) enlarged thyroid, (-) neck pain. Cardiac: (-) chest pain, (-) edema, (-) high blood pressure, (-) irregular heartbeat, (-) orthopnea, (-) palpitations, (-) paroxysmal nocturnal dyspnea, (-) shortness of breath. Respiratory: (-) cough, (-) hemoptysis, (-) shortness of breath, (-) cyanosis, (-) wheezing, (-) nocturnal choking or gasping, (-) TB exposure. Breasts: (-) nipple discharge, (-) breast lumps, (-) breast pain. Gastrointestinal: (+) ulcers. Urinary: (-) dysuria, (-) frequency, (-) urgency, (-) hesitancy, (-) polyuria, (-) nocturia, (-) hematuria, (-) urinary incontinence, (-) flank pain, (-) change in urinary habits. Gynecologic/Urologic: (-) genital sores or lesions, (-) history of STD, (-) sexual difficulties. Musculoskeletal: (-) muscle pain, (-) joint pain, (-) bone pain. Peripheral Vascular: (-) intermittent claudication, (-) cramps, (-) varicose veins, (-) thrombophlebitis. Neurological: (+) migraine headaches. Psychiatric: (-) anxiety, (-) depression, (-) sleep disturbance, (-) irritability, (-) mood swings, (-) suicidal thoughts or ideations. Endocrine: (-) heat or cold intolerance, (-) excessive sweating, (-) diabetes, (-) excessive thirst, (-) excessive hunger, (-) excessive urination, (-) hirsutism, (-) change in ring or shoe size. Hematologic/Lymphatic: (+) anemia. Skin: (-) rashes, (-) lumps, (-) itching, (-) dryness, (-) acne, (-) discoloration, (-) recurrent skin infections, (-) changes in hair, nails or moles.  Vital Signs: Weight:   80.286  kgs Height:   5\' 6"  BMI:   28.57 BSA:   1.93 BP:   121/74  Examination: General Appearance - Adult: The patient is a well-developed, well-nourished, female, has no recognizable syndromes or patterns of malformation, and is in no acute distress. She is awake, alert, coherent, spontaneous, and logical. She is oriented to time, place, and person and communicates without difficulty.  Head: The patient's head was normocephalic and without any evidence of trauma or lesions.  Face: Her facial motion was intact and symmetric bilaterally with normal resting facial tone and voluntary facial power.  Skin: Gross inspection of her facial skin demonstrated no evidence of abnormality.  Eyes: Her pupils are equal, regular, reactive to light and accommodate (PERRLA). Extraocular movements were intact (EOMI). Conjunctivae were normal. There was no sclera icterus. There was no nystagmus. Eyelids appeared normal. There was no ptosis, lid lag, lid edema, or lagophthalmos.  External ears: Both of her external ears were normal in size, shape, angulation, and location.  External auditory canals: Examination of the external auditory canals revealed a cerumen impaction of both EAC's.  Procedure: Cerumen Removal - Using the microscope and microinstruments, cerumen impactions were removed  atraumatically from both EAC's. Both canals were stable and without signs of infection. The patient tolerated the procedure well.  Right Tympanic Membrane: The right tympanic membrane is mildly retracted but intact and mobile.  Left Tympanic Membrane: The left tympanic membrane is intact and mobile.  Audiology Procedures: Audiogram/Graph Audiogram: I have referred the patient for audiometric testing. I have reviewed the patient's audiogram. (EMK). The patient was found to have moderate to moderately severe mixed hearing loss in the left ear with an SRT of 40 dB and 92% discrimination. She has almost a dead ear on the right with  an SRT of 55 DB and 8% discrimination.  Impression: Impacted cerumen: Bilateral.  Other:  1. Bilateral asymmetric hearing losses with a moderate to moderately severe sensorineural hearing loss in the left ear with good discrimination and a moderately severe to severe mixed hearing loss in the right ear with poor discrimination. She is status post failed stapedectomies bilaterally elsewhere. 2. Bilateral otosclerosis status post bilateral stapedectomies elsewhere. 3. The patient is greatly benefiting from hearing aid amplification AS and should continue to wear her hearing aid. 4. The patient was counseled not to have an MRI scan but rather a CT scan. If she requires any imaging studies. 5. She was tried with a Baha 5 headband AD and liked the sound quality. I would recommend a Baha hearing implant for her right temporal bone, One hour, [facility] General LMA anesthesia, outpatient. Risks, alternatives and complications were explained to her. Questions were invited and answered. Informed consent has been signed and witnessed. Preop teaching and counseling have been provided. She will require preop clearance from her orthopedic surgeon following the results of her CT scan that is scheduled for August 20, 2014.  Plan: Clinical summary letter made available to patient today. This letter may not be complete at time of service. Please contact our office within 3 days for a completed summary of today's visit.  Status: stable. Medications: None required. Diet: no restriction. Procedure: BAHA hearing implant Right ear. Duration:  1 hour. Surgeon: Fannie Knee MD Office Phone: [phone] Office Fax: [phone] Cell Phone: [phone]. Anesthesia Required: General. Implants:  BAHA implant. [facility]: no. Latex Allergy: no.  Informed consent: Informed consent was provided in a quiet examination room and was witnessed. Risks, complications, and alternatives (such as doing  nothing, using a CROS system, or using an alternative hearing implant approved for the treatment of their condition) of osseo-integrated hearing implants were explained to the patient and included, but were not limited to: infection, bleeding, reaction to anesthesia, hypertrophic scar formation, need for additional procedures or revisions, failure and/extrusion of prosthesis(es), ear or scalp numbness, failure to improve hearing, other unforeseen or unpredictable complications, death, etc. Questions were invited and answered. Preoperative teaching and counseling were provided. Informed consent - status: Informed consent was provided and was signed and witnessed. Follow-Up: Postoperative visit as scheduled.  Diagnosis: H90.3  Sensorineural hearing loss, bilateral  H80.03  Otosclerosis involving oval window, nonobliterative, bilateral  H61.23  Impacted cerumen, bilateral   Careplan: (1) Cerumen Impaction (2) Hearing Loss (3) Postop BAHA/Ponto hearing implants  Followup: Postop visit   This visit note has been electronically signed off by Vicie Mutters, MD, MS, FACS on 08/16/2014 at 10:37 AM.       Next Appointment: 08/22/2014 at 09:30 AM     Past Medical History  Diagnosis Date  . Sensory - neural hearing loss     right  Past Surgical History  Procedure Laterality Date  . Breast surgery      breast reduction  . Stapedectomy Bilateral   . Hiatal hernia repair    . Carpal tunnel release Right   . Rotator cuff repair Right     History reviewed. No pertinent family history. Social History:  reports that she has never smoked. She does not have any smokeless tobacco history on file. She reports that she drinks alcohol. She reports that she does not use illicit drugs.  Allergies: No Known Allergies  No prescriptions prior to admission    No results found for this or any previous visit (from the past 48 hour(s)). Ct Thoracic Spine Wo Contrast  08/17/2014   CLINICAL DATA:   Fall 2 days ago with persistent upper back pain, initial encounter  EXAM: CT THORACIC SPINE WITHOUT CONTRAST  TECHNIQUE: Multidetector CT imaging of the thoracic spine was performed without intravenous contrast administration. Multiplanar CT image reconstructions were also generated.  COMPARISON:  10/07/2012  FINDINGS: There are changes consistent with a fracture of the T6 vertebral body involving predominately the inferior endplate. There is approximately 50% vertebral body height loss inferiorly. No significant paraspinal hematoma is noted. Mild degenerative changes of the thoracic spine are noted. The visualized rib cage is within normal limits. The lung fields as visualized are unremarkable. A moderate-sized hiatal hernia is seen. No significant retropulsion is noted at the fracture site. No significant central canal stenosis is noted. No specific disc pathology is seen.  IMPRESSION: Acute compression fracture of T6 with approximately 50% vertebral body height loss. No significant retropulsion is noted.  If the patient fails traditional medical management, she may be a candidate for percutaneous vertebral augmentation.   Electronically Signed   By: Inez Catalina M.D.   On: 08/17/2014 17:32    Review of Systems  Constitutional: Negative.   HENT: Positive for hearing loss.   Eyes: Negative.   Respiratory: Negative.   Cardiovascular: Negative.   Gastrointestinal: Negative.   Genitourinary: Negative.   Musculoskeletal: Negative.   Skin: Negative.   Neurological: Negative.   Endo/Heme/Allergies: Negative.   Psychiatric/Behavioral: Negative.     Height 5' 5.5" (1.664 m), weight 78.472 kg (173 lb). Physical Exam   Assessment/Plan 1. Moderately severe to severe mixed hearing loss, right ear, s/p failed bilateral stapedectomies elsewhere. Moderate to moderately severe sensorineural hearing loss, left ear 2. Proceed with a right BAHA hearing implant, 1 hr., [facility], general LMA anesthesia, outpatient.  Risks, complications, and alternatives have been explained to the patient. Questions have been invited and answered. Informed consent has been signed and witnessed. Preoperative teaching and counseling have been provided. 3. The procedure is scheduled for Wednesday, August 22, 2014 at 9:30am, [place], as an outpatient.   Thornell Mule, Aya Geisel M 08/19/2014, 11:17 AM

## 2014-08-20 ENCOUNTER — Other Ambulatory Visit: Payer: Medicare Other

## 2014-08-20 DIAGNOSIS — S2020XD Contusion of thorax, unspecified, subsequent encounter: Secondary | ICD-10-CM | POA: Diagnosis not present

## 2014-08-20 DIAGNOSIS — M40204 Unspecified kyphosis, thoracic region: Secondary | ICD-10-CM | POA: Diagnosis not present

## 2014-08-20 DIAGNOSIS — M4854XA Collapsed vertebra, not elsewhere classified, thoracic region, initial encounter for fracture: Secondary | ICD-10-CM | POA: Diagnosis not present

## 2014-08-20 DIAGNOSIS — S300XXD Contusion of lower back and pelvis, subsequent encounter: Secondary | ICD-10-CM | POA: Diagnosis not present

## 2014-08-21 DIAGNOSIS — Z85828 Personal history of other malignant neoplasm of skin: Secondary | ICD-10-CM | POA: Diagnosis not present

## 2014-08-21 DIAGNOSIS — L570 Actinic keratosis: Secondary | ICD-10-CM | POA: Diagnosis not present

## 2014-08-21 DIAGNOSIS — L821 Other seborrheic keratosis: Secondary | ICD-10-CM | POA: Diagnosis not present

## 2014-08-21 DIAGNOSIS — C44319 Basal cell carcinoma of skin of other parts of face: Secondary | ICD-10-CM | POA: Diagnosis not present

## 2014-08-21 DIAGNOSIS — D485 Neoplasm of uncertain behavior of skin: Secondary | ICD-10-CM | POA: Diagnosis not present

## 2014-08-21 DIAGNOSIS — C44719 Basal cell carcinoma of skin of left lower limb, including hip: Secondary | ICD-10-CM | POA: Diagnosis not present

## 2014-08-21 DIAGNOSIS — L720 Epidermal cyst: Secondary | ICD-10-CM | POA: Diagnosis not present

## 2014-08-22 ENCOUNTER — Ambulatory Visit (HOSPITAL_BASED_OUTPATIENT_CLINIC_OR_DEPARTMENT_OTHER): Admission: RE | Admit: 2014-08-22 | Payer: Medicare Other | Source: Ambulatory Visit | Admitting: Otolaryngology

## 2014-08-22 HISTORY — DX: Unspecified sensorineural hearing loss: H90.5

## 2014-08-22 SURGERY — INSERTION, BONE ANCHORED HEARING AID
Anesthesia: General | Laterality: Right

## 2014-09-10 DIAGNOSIS — M40204 Unspecified kyphosis, thoracic region: Secondary | ICD-10-CM | POA: Diagnosis not present

## 2014-09-10 DIAGNOSIS — M4854XA Collapsed vertebra, not elsewhere classified, thoracic region, initial encounter for fracture: Secondary | ICD-10-CM | POA: Diagnosis not present

## 2014-09-26 DIAGNOSIS — Z85828 Personal history of other malignant neoplasm of skin: Secondary | ICD-10-CM | POA: Diagnosis not present

## 2014-09-26 DIAGNOSIS — L738 Other specified follicular disorders: Secondary | ICD-10-CM | POA: Diagnosis not present

## 2014-09-26 DIAGNOSIS — L570 Actinic keratosis: Secondary | ICD-10-CM | POA: Diagnosis not present

## 2014-10-02 DIAGNOSIS — C44311 Basal cell carcinoma of skin of nose: Secondary | ICD-10-CM | POA: Diagnosis not present

## 2014-10-03 ENCOUNTER — Encounter (HOSPITAL_BASED_OUTPATIENT_CLINIC_OR_DEPARTMENT_OTHER): Payer: Self-pay | Admitting: *Deleted

## 2014-10-09 NOTE — H&P (Signed)
[name] is an 69 y.o. female.   Chief Complaint: Moderate to moderately severe sensorineural hearing loss left ear, moderately severe to severe mixed hearing loss right ear s/p two failed stapedectomies elsewhere for otosclerosis HPI: See H&P below  History & Physical Examination   Patient: [name]  Provider: Vicie Mutters, MD, MS, FACS  Date of Service:  Oct 04, 2014  Location: [facility], [place], [place]                  [place], [place]   [id number]                                Ph: [phone], Fax: [phone]                  [email or web address]     Provider: Vicie Mutters, MD, MS, FACS Encounter Date: Oct 04, 2014  Patient: Hannah, [name]   ([id number]) Sex: Female       DOB: 1945/11/21      Age: 53 year 7 month       Race: White Address: [place],  [place]  [place]  [address] [place]: MEDICARE  Referred By:  Vicie Mutters   Visit Type: [name], a 89 year 85 month White female is here today for a pre-operative visit.  Complaint/HPI: Patient was here today for preop evaluation prior to undergoing a right Baha hearing implant to treat single sided deafness. She had undergone a previous preop examination. A CT scan of her back related to a fall showed a fracture and her procedure was postponed. She is now ready for a general anesthetic. She denied any upper respiratory tract infection, cough, or fever. She recently had a basal cell carcinoma removed from her right nasomaxillary area.  Previous history: The patient was here today for a preoperative evaluation prior to undergoing a right Baha hearing implant to treat single sided deafness. The patient fell yesterday while wearing socks on wooden steps. She fell from a height of four steps onto her back. She has seen an orthopedic surgeon who has ordered a CT scan of her back. That is scheduled for Monday  morning, August 20, 2014. She is not having any particular breathing difficulty, but is having some trouble taking a deep breath. She denied any fever or other symptoms.  Previous Hx: The patient was here today for follow-up of her hearing and for cerumen impaction removal. She has undergone bilateral stapedectomies elsewhere, with poor results bilaterally, especially the right ear which is essentially a dead ear. She has no complaints of otorrhea or otalgia. She is unable to localize.  Previous history: The patient was here today for bilateral cerumen impaction removal. Prior to traveling to [place] for five weeks. She is not hearing well from her right ear. She is status post bilateral stapedectomies elsewhere. She has poor discrimination in the right ear. She denied otorrhea or otalgia.  Previous history: The patient was here today for cerumen removal AU. She is wearing binaural digital BTE hearing aids with success. She's planning a trip to [place]. He denies otorrhea, otalgia, or vertigo. This is her routine six month checkup  Previous history: The patient was here today for evaluation of her ears and hearing. She  has undergone bilateral stapedectomies elsewhere. She is wearing binaural hearing aids and feels that the right hearing aid does help her. She denies otorrhea, otalgia, or vertigo.  Medical History: Vaccinations: Flu vaccinations: Yes, flu shot - since Nov 02, 2013 - code [phone].  Pneumonia vaccination: Yes - code 4040F.  Ear Operations: . Stapedectomy.  Surgical History: Prior surgeries include appendectomy, breast surgery,  carpal tunnel release ,  hernia repair  and rotator cuff repair.  Allergy:  No Known Drug Allergies  Examination: General Appearance - Adult: The patient is a well-developed, well-nourished, female, has no recognizable syndromes or patterns of malformation, and is in no acute distress. She is awake, alert, coherent, spontaneous, and logical. She is oriented to  time, place, and person and communicates without difficulty.  Head: The patient's head was normocephalic and without any evidence of trauma or lesions.  Face: s/p excision of a basal cell carcinoma right nasomaxillary crease.  Skin: Gross inspection of her facial skin demonstrated no evidence of abnormality.  Eyes: Her pupils are equal, regular, reactive to light and accommodate (PERRLA). Extraocular movements were intact (EOMI). Conjunctivae were normal. There was no sclera icterus. There was no nystagmus. Eyelids appeared normal. There was no ptosis, lid lag, lid edema, or lagophthalmos.  External ears: Both of her external ears were normal in size, shape, angulation, and location.  External auditory canals: Examination of the external auditory canals revealed a cerumen impaction of both EAC's.  Procedure: Cerumen Removal - Using the microscope and microinstruments, cerumen impactions were removed atraumatically from both EAC's. Both canals were stable and without signs of infection. The patient tolerated the procedure well.  Right Tympanic Membrane: The right tympanic membrane is mildly retracted but intact and mobile.  Left Tympanic Membrane: The left tympanic membrane is intact and mobile.  Impression: Impacted cerumen: Bilateral.  Other:  1. Bilateral asymmetric hearing losses with a moderate to moderately severe sensorineural hearing loss in the left ear with good discrimination and a moderately severe to severe mixed hearing loss in the right ear with poor discrimination. She is status post failed stapedectomies bilaterally elsewhere. 2. Bilateral otosclerosis status post bilateral stapedectomies elsewhere. 3. The patient is greatly benefiting from hearing aid amplification AS and should continue to wear her hearing aid. 4. The patient was counseled not to have an MRI scan but rather a CT scan. If she requires any imaging studies. 5. She was tried with a Baha 5 headband AD and  liked the sound quality. I would recommend a Baha hearing implant for her right temporal bone, One hour, [facility] General LMA anesthesia, outpatient. Risks, alternatives and complications were explained to her again. Questions were invited and answered. Informed consent has been signed and witnessed. Preop teaching and counseling have been provided.  Plan: Clinical summary letter made available to patient today. This letter may not be complete at time of service. Please contact our office within 3 days for a completed summary of today's visit.  Status: stable. Medications: None required. Diet: no restriction. Procedure: BAHA hearing implant Right ear. Duration:  1 hour. Surgeon: Fannie Knee MD Office Phone: [phone] Office Fax: [phone] Cell Phone: [phone]. Anesthesia Required: General. Implants:  BAHA implant. [facility]: no. Latex Allergy: no.  Informed consent: Informed consent was provided in a quiet examination room and was witnessed. Risks, complications, and alternatives (such as doing nothing, using a CROS system, or using an alternative hearing implant approved for the treatment of their condition) of  osseo-integrated hearing implants were explained to the patient and included, but were not limited to: infection, bleeding, reaction to anesthesia, hypertrophic scar formation, need for additional procedures or revisions, failure and/extrusion of prosthesis(es), ear or scalp numbness, failure to improve hearing, other unforeseen or unpredictable complications, death, etc. Questions were invited and answered. Preoperative teaching and counseling were provided. Informed consent - status: Informed consent was provided and was signed and witnessed. Follow-Up: Postoperative visit as scheduled.  Diagnosis: H90.3  Sensorineural hearing loss, bilateral  H80.03  Otosclerosis involving oval window, nonobliterative, bilateral  H61.23  Impacted cerumen,  bilateral   Careplan: (1) Cerumen Impaction (2) Hearing Loss (3) Postop BAHA/Ponto hearing implants  Followup: Postop visit   This visit note has been electronically signed off by Vicie Mutters, MD, MS, FACS on 10/04/2014 at 09:22 PM.       Next Appointment: 10/10/2014 at 09:05 AM      Past Medical History  Diagnosis Date  . Sensory - neural hearing loss     right  . Compression fracture     lumbar     Past Surgical History  Procedure Laterality Date  . Breast surgery      breast reduction  . Stapedectomy Bilateral   . Hiatal hernia repair    . Carpal tunnel release Right   . Rotator cuff repair Right     History reviewed. No pertinent family history. Social History:  reports that she has never smoked. She does not have any smokeless tobacco history on file. She reports that she drinks alcohol. She reports that she does not use illicit drugs.  Allergies: No Known Allergies  No prescriptions prior to admission    No results found for this or any previous visit (from the past 48 hour(s)). No results found.  Review of Systems  Constitutional: Negative.   HENT: Positive for hearing loss.   Eyes: Negative.   Respiratory: Negative.   Cardiovascular: Negative.   Gastrointestinal: Negative.   Genitourinary: Negative.   Musculoskeletal: Negative.   Skin: Negative.   Neurological: Negative.   Endo/Heme/Allergies: Negative.   Psychiatric/Behavioral: Negative.     Height 5' 5.5" (1.664 m), weight 77.111 kg (170 lb). Physical Exam   Assessment/Plan 1.  Moderate to moderately severe sensorineural hearing loss left ear, moderately severe to severe mixed hearing loss right ear s/p two failed stapedectomies elsewhere for otosclerosis 2. Recommend proceeding with a right BAHA hearing implant,  1 hour, [facility], gen LMA anesthesia, outpatient. Risks, complications and alternatives have been explained to the patient. Questions have been invited and answered. Informed  consent has been signed and witnessed. Preoperative teaching and counseling have been provided. 3. The procedure is scheduled for 10-10-14, [facility].   Thornell Mule, Huberta Tompkins M 10/09/2014, 8:31 PM

## 2014-10-10 ENCOUNTER — Ambulatory Visit (HOSPITAL_BASED_OUTPATIENT_CLINIC_OR_DEPARTMENT_OTHER): Payer: Medicare Other | Admitting: Anesthesiology

## 2014-10-10 ENCOUNTER — Encounter (HOSPITAL_BASED_OUTPATIENT_CLINIC_OR_DEPARTMENT_OTHER)
Admission: RE | Disposition: A | Discharge status: Home / Self Care | Payer: Self-pay | Source: Ambulatory Visit | Attending: Otolaryngology

## 2014-10-10 ENCOUNTER — Ambulatory Visit (HOSPITAL_BASED_OUTPATIENT_CLINIC_OR_DEPARTMENT_OTHER)
Admission: RE | Admit: 2014-10-10 | Discharge: 2014-10-10 | Disposition: A | Discharge status: Home / Self Care | Payer: Medicare Other | Source: Ambulatory Visit | Attending: Otolaryngology | Admitting: Otolaryngology

## 2014-10-10 ENCOUNTER — Encounter (HOSPITAL_BASED_OUTPATIENT_CLINIC_OR_DEPARTMENT_OTHER): Payer: Self-pay

## 2014-10-10 DIAGNOSIS — H906 Mixed conductive and sensorineural hearing loss, bilateral: Secondary | ICD-10-CM | POA: Insufficient documentation

## 2014-10-10 DIAGNOSIS — H8003 Otosclerosis involving oval window, nonobliterative, bilateral: Secondary | ICD-10-CM | POA: Diagnosis not present

## 2014-10-10 DIAGNOSIS — H8083 Other otosclerosis, bilateral: Secondary | ICD-10-CM | POA: Diagnosis not present

## 2014-10-10 DIAGNOSIS — H6123 Impacted cerumen, bilateral: Secondary | ICD-10-CM | POA: Insufficient documentation

## 2014-10-10 DIAGNOSIS — H903 Sensorineural hearing loss, bilateral: Secondary | ICD-10-CM | POA: Diagnosis not present

## 2014-10-10 DIAGNOSIS — Z85828 Personal history of other malignant neoplasm of skin: Secondary | ICD-10-CM | POA: Insufficient documentation

## 2014-10-10 HISTORY — PX: BONE ANCHORED HEARING AID IMPLANT: SHX5193

## 2014-10-10 HISTORY — DX: Reserved for concepts with insufficient information to code with codable children: IMO0002

## 2014-10-10 SURGERY — INSERTION, BONE ANCHORED HEARING AID
Anesthesia: General | Site: Ear | Laterality: Right

## 2014-10-10 MED ORDER — METHYLENE BLUE 1 % INJ SOLN
INTRAMUSCULAR | Status: AC
  Filled 2014-10-10: qty 10

## 2014-10-10 MED ORDER — LIDOCAINE HCL (CARDIAC) 20 MG/ML IV SOLN
INTRAVENOUS | Status: DC | PRN
  Administered 2014-10-10: 60 mg via INTRAVENOUS

## 2014-10-10 MED ORDER — CEFAZOLIN SODIUM-DEXTROSE 2-3 GM-% IV SOLR
INTRAVENOUS | Status: AC
  Filled 2014-10-10: qty 50

## 2014-10-10 MED ORDER — MIDAZOLAM HCL 2 MG/2ML IJ SOLN
1.0000 mg | INTRAMUSCULAR | Status: DC | PRN
Start: 2014-10-10 — End: 2014-10-10
  Administered 2014-10-10: 2 mg via INTRAVENOUS

## 2014-10-10 MED ORDER — FENTANYL CITRATE (PF) 100 MCG/2ML IJ SOLN
50.0000 ug | INTRAMUSCULAR | Status: DC | PRN
  Administered 2014-10-10: 100 ug via INTRAVENOUS

## 2014-10-10 MED ORDER — ONDANSETRON HCL 4 MG/2ML IJ SOLN
INTRAMUSCULAR | Status: AC
  Filled 2014-10-10: qty 2

## 2014-10-10 MED ORDER — DEXAMETHASONE SODIUM PHOSPHATE 10 MG/ML IJ SOLN
INTRAMUSCULAR | Status: AC
  Filled 2014-10-10: qty 1

## 2014-10-10 MED ORDER — MIDAZOLAM HCL 2 MG/2ML IJ SOLN
INTRAMUSCULAR | Status: AC
  Filled 2014-10-10: qty 4

## 2014-10-10 MED ORDER — CEFAZOLIN (ANCEF) 1 G IV SOLR: 2.0000 g | INTRAVENOUS | Status: DC

## 2014-10-10 MED ORDER — CEFAZOLIN SODIUM-DEXTROSE 2-3 GM-% IV SOLR
2.0000 g | INTRAVENOUS | Status: AC
  Administered 2014-10-10: 2 g via INTRAVENOUS

## 2014-10-10 MED ORDER — PROPOFOL 10 MG/ML IV BOLUS
INTRAVENOUS | Status: DC | PRN
  Administered 2014-10-10: 200 mg via INTRAVENOUS

## 2014-10-10 MED ORDER — LACTATED RINGERS IV SOLN
INTRAVENOUS | Status: DC
  Administered 2014-10-10 (×2): via INTRAVENOUS

## 2014-10-10 MED ORDER — METHYLENE BLUE 1 % INJ SOLN
INTRAMUSCULAR | Status: DC | PRN
  Administered 2014-10-10: .1 mL via INTRADERMAL

## 2014-10-10 MED ORDER — GLYCOPYRROLATE 0.2 MG/ML IJ SOLN: 0.2000 mg | Freq: Once | INTRAMUSCULAR | Status: DC | PRN

## 2014-10-10 MED ORDER — DEXAMETHASONE SODIUM PHOSPHATE 4 MG/ML IJ SOLN
10.0000 mg | INTRAMUSCULAR | Status: AC
  Administered 2014-10-10: 10 mg via INTRAVENOUS

## 2014-10-10 MED ORDER — SCOPOLAMINE 1 MG/3DAYS TD PT72: 1.0000 | MEDICATED_PATCH | Freq: Once | TRANSDERMAL | Status: DC | PRN

## 2014-10-10 MED ORDER — ONDANSETRON HCL 4 MG/2ML IJ SOLN: 4.0000 mg | INTRAMUSCULAR | Status: DC

## 2014-10-10 MED ORDER — ONDANSETRON HCL 4 MG/2ML IJ SOLN
4.0000 mg | INTRAMUSCULAR | Status: AC
  Administered 2014-10-10 (×2): 4 mg via INTRAVENOUS

## 2014-10-10 MED ORDER — HYDROMORPHONE HCL 1 MG/ML IJ SOLN: 0.2500 mg | INTRAMUSCULAR | Status: DC | PRN

## 2014-10-10 MED ORDER — MEPERIDINE HCL 25 MG/ML IJ SOLN: 6.2500 mg | INTRAMUSCULAR | Status: DC | PRN

## 2014-10-10 MED ORDER — EPHEDRINE SULFATE 50 MG/ML IJ SOLN
INTRAMUSCULAR | Status: DC | PRN
  Administered 2014-10-10 (×2): 10 mg via INTRAVENOUS

## 2014-10-10 MED ORDER — EPHEDRINE SULFATE 50 MG/ML IJ SOLN
INTRAMUSCULAR | Status: AC
  Filled 2014-10-10: qty 1

## 2014-10-10 MED ORDER — BACITRACIN 50000 UNITS IM SOLR
INTRAMUSCULAR | Status: DC | PRN
  Administered 2014-10-10: 50 mL

## 2014-10-10 MED ORDER — BACITRACIN ZINC 500 UNIT/GM EX OINT
TOPICAL_OINTMENT | CUTANEOUS | Status: AC
  Filled 2014-10-10: qty 28.35

## 2014-10-10 MED ORDER — LIDOCAINE-EPINEPHRINE 1 %-1:100000 IJ SOLN
INTRAMUSCULAR | Status: DC | PRN
  Administered 2014-10-10: 1 mL

## 2014-10-10 MED ORDER — DEXAMETHASONE SODIUM PHOSPHATE 4 MG/ML IJ SOLN: 10.0000 mg | INTRAMUSCULAR | Status: DC

## 2014-10-10 MED ORDER — BACITRACIN 500 UNIT/GM EX OINT
TOPICAL_OINTMENT | CUTANEOUS | Status: DC | PRN
  Administered 2014-10-10: 1 via TOPICAL

## 2014-10-10 MED ORDER — FENTANYL CITRATE (PF) 100 MCG/2ML IJ SOLN
INTRAMUSCULAR | Status: AC
  Filled 2014-10-10: qty 4

## 2014-10-10 MED ORDER — LIDOCAINE-EPINEPHRINE 1 %-1:100000 IJ SOLN
INTRAMUSCULAR | Status: AC
  Filled 2014-10-10: qty 1

## 2014-10-10 MED ORDER — PROMETHAZINE HCL 25 MG/ML IJ SOLN: 6.2500 mg | INTRAMUSCULAR | Status: DC | PRN

## 2014-10-10 SURGICAL SUPPLY — 48 items
BAG DECANTER FOR FLEXI CONT (MISCELLANEOUS) ×3 IMPLANT
BALL CTTN LRG ABS STRL LF (GAUZE/BANDAGES/DRESSINGS) ×1
BIT DRILL WIDENING 4MM (DRILL) IMPLANT
BLADE CLIPPER SURG (BLADE) ×3 IMPLANT
CANISTER SUCT 1200ML W/VALVE (MISCELLANEOUS) ×3 IMPLANT
CAP HEALING W/PLUG 030MM (MISCELLANEOUS) ×2 IMPLANT
CLOSURE WOUND 1/4X4 (GAUZE/BANDAGES/DRESSINGS) ×1
COTTONBALL LRG STERILE PKG (GAUZE/BANDAGES/DRESSINGS) ×3 IMPLANT
DECANTER SPIKE VIAL GLASS SM (MISCELLANEOUS) ×1 IMPLANT
DRAPE INCISE IOBAN 66X45 STRL (DRAPES) ×3 IMPLANT
DRAPE SURG 17X23 STRL (DRAPES) ×3 IMPLANT
DRILL WIDENING 4MM (DRILL) ×3
DRSG GLASSCOCK MASTOID ADT (GAUZE/BANDAGES/DRESSINGS) ×3 IMPLANT
ELECT COATED BLADE 2.86 ST (ELECTRODE) ×3 IMPLANT
ELECT REM PT RETURN 9FT ADLT (ELECTROSURGICAL) ×3
ELECTRODE REM PT RTRN 9FT ADLT (ELECTROSURGICAL) ×1 IMPLANT
GAUZE PACKING IODOFORM 1/4X15 (GAUZE/BANDAGES/DRESSINGS) ×3 IMPLANT
GAUZE SPONGE 4X4 16PLY XRAY LF (GAUZE/BANDAGES/DRESSINGS) IMPLANT
GLOVE ECLIPSE 7.5 STRL STRAW (GLOVE) ×3 IMPLANT
GLOVE EXAM NITRILE LRG STRL (GLOVE) ×2 IMPLANT
GLOVE SURG SS PI 7.0 STRL IVOR (GLOVE) ×2 IMPLANT
GOWN STRL REUS W/ TWL LRG LVL3 (GOWN DISPOSABLE) ×2 IMPLANT
GOWN STRL REUS W/TWL LRG LVL3 (GOWN DISPOSABLE) ×6
GUIDE DRILL EAR BAHA 3MM 4MM (MISCELLANEOUS) ×2 IMPLANT
IMPL EAR ABUTMENT 4MM W/10 (Head) IMPLANT
IMPLANT EAR ABUTMENT 4MM W/10 (Head) ×3 IMPLANT
MARKER SKIN DUAL TIP RULER LAB (MISCELLANEOUS) IMPLANT
NDL HYPO 25X1 1.5 SAFETY (NEEDLE) ×1 IMPLANT
NDL SAFETY ECLIPSE 18X1.5 (NEEDLE) ×1 IMPLANT
NEEDLE HYPO 18GX1.5 SHARP (NEEDLE) ×3
NEEDLE HYPO 22GX1.5 SAFETY (NEEDLE) ×3 IMPLANT
NEEDLE HYPO 25X1 1.5 SAFETY (NEEDLE) ×3 IMPLANT
PACK BASIN DAY SURGERY FS (CUSTOM PROCEDURE TRAY) ×3 IMPLANT
PACK ENT DAY SURGERY (CUSTOM PROCEDURE TRAY) ×3 IMPLANT
PATTIES SURGICAL .5 X3 (DISPOSABLE) IMPLANT
PENCIL BUTTON HOLSTER BLD 10FT (ELECTRODE) ×3 IMPLANT
PROCESSOR BAHA 5 BLONDE (OTOLOGIC RELATED) ×2 IMPLANT
PUNCH BIOPSY (OTIC EAR SUPPLIES) ×3 IMPLANT
PUNCH BIOPSY 5MM COCHLEAR (MISCELLANEOUS) ×2 IMPLANT
SLEEVE SCD COMPRESS KNEE MED (MISCELLANEOUS) ×3 IMPLANT
STRIP CLOSURE SKIN 1/4X4 (GAUZE/BANDAGES/DRESSINGS) ×1 IMPLANT
SUT BONE WAX W31G (SUTURE) IMPLANT
SUT CHROMIC 3 0 PS 2 (SUTURE) IMPLANT
SUT ETHILON 4 0 PS 2 18 (SUTURE) IMPLANT
SYR BULB 3OZ (MISCELLANEOUS) ×3 IMPLANT
SYR TB 1ML LL NO SAFETY (SYRINGE) ×3 IMPLANT
TOWEL OR 17X24 6PK STRL BLUE (TOWEL DISPOSABLE) ×6 IMPLANT
WIRELESS TV STREAMER ×2 IMPLANT

## 2014-10-10 NOTE — Discharge Instructions (Signed)
1. Elevate head of bed 30 degrees for the next 5 nights. 2. Keep right implant site dry for the next week. 3. Follow your regular diet at home. 4. Apply mupirocin ointment to incision line twice/day for one week. 5. Medications: Cefzil 500 mg PO BID x 1 wk, Vicodin 5/300 1 PO Q4hrs prn pain, and Mupirocin ointment, apply to incision twice daily for one week. 6. Quiet indoor activity for the next week. 7. Follow the instructions on the yellow instruction sheet given to you by Dr. Thornell Mule 8. Call [phone] for any questions or problems directly related to your operation.  Call your surgeon if you experience:   1.  Fever over 101.0. 2.  Inability to urinate. 3.  Nausea and/or vomiting. 4.  Extreme swelling or bruising at the surgical site. 5.  Continued bleeding from the incision. 6.  Increased pain, redness or drainage from the incision. 7.  Problems related to your pain medication. 8.  Any problems and/or concerns   Post Anesthesia Home Care Instructions  Activity: Get plenty of rest for the remainder of the day. A responsible adult should stay with you for 24 hours following the procedure.  For the next 24 hours, DO NOT: -Drive a car -Paediatric nurse -Drink alcoholic beverages -Take any medication unless instructed by your physician -Make any legal decisions or sign important papers.  Meals: Start with liquid foods such as gelatin or soup. Progress to regular foods as tolerated. Avoid greasy, spicy, heavy foods. If nausea and/or vomiting occur, drink only clear liquids until the nausea and/or vomiting subsides. Call your physician if vomiting continues.  Special Instructions/Symptoms: Your throat may feel dry or sore from the anesthesia or the breathing tube placed in your throat during surgery. If this causes discomfort, gargle with warm salt water. The discomfort should disappear within 24 hours.  If you had a scopolamine patch placed behind your ear for the management of  post- operative nausea and/or vomiting:  1. The medication in the patch is effective for 72 hours, after which it should be removed.  Wrap patch in a tissue and discard in the trash. Wash hands thoroughly with soap and water. 2. You may remove the patch earlier than 72 hours if you experience unpleasant side effects which may include dry mouth, dizziness or visual disturbances. 3. Avoid touching the patch. Wash your hands with soap and water after contact with the patch.

## 2014-10-10 NOTE — Interval H&P Note (Signed)
History and Physical Interval Note:  10/10/2014 8:50 AM  [name]  has presented today for surgery, with the diagnosis of BILATERAL SENSORY NEURAL HEARING LOSS s/p failed bilateral stapedectomies performed elsewhere by another surgeon.  The various methods of treatment have been discussed with the patient and her husband. After consideration of risks, benefits and other options for treatment, the patient has consented to  Procedure(s): BONE ANCHORED HEARING AID (BAHA) IMPLANT TEMPORAL BONE RIGHT EYE (Right) as a surgical intervention .  The patient's history has been reviewed, patient examined, no change in status, stable for surgery.  I have reviewed the patient's chart and labs.  Questions were answered to the patient's satisfaction.  Preoperative counseling has been provided. The patient denies any recent cough, upper respiratory tract infection or fever during the last 3 days.   Thornell Mule, Mandolin Falwell M

## 2014-10-10 NOTE — Brief Op Note (Signed)
10/10/2014  10:22 AM  PATIENT:  [name]  69 y.o. female  PRE-OPERATIVE DIAGNOSIS:  BILATERAL SENSORY NEURAL HEARING LOSS s/p failed bilateral stapedectomies elsewhere by another surgeon  POST-OPERATIVE DIAGNOSIS:  BILATERAL SENSORY NEURAL HEARING LOSS s/p failed bilateral stapedectomies elsewhere by another surgeon   PROCEDURE:  Procedure(s): BONE ANCHORED HEARING AID (BAHA) IMPLANT TEMPORAL BONE RIGHT EYE (Right) - [id number] 59mm wide x 10 mm long  SURGEON:  Surgeon(s) and Role:    * Vicie Mutters, MD - Primary  PHYSICIAN ASSISTANT:   ASSISTANTS: none   ANESTHESIA:   local and general  EBL:  Total I/O In: 900 [I.V.:900] Out: -   BLOOD ADMINISTERED:none  DRAINS: none   LOCAL MEDICATIONS USED:  XYLOCAINE 1 ml  SPECIMEN:  No Specimen  DISPOSITION OF SPECIMEN:  N/A  COUNTS:  YES  TOURNIQUET:  * No tourniquets in log *  DICTATION: .Other Dictation: Dictation Number [id number]  PLAN OF CARE: Discharge to home after PACU  PATIENT DISPOSITION:  PACU - hemodynamically stable.   Delay start of Pharmacological VTE agent (>24hrs) due to surgical blood loss or risk of bleeding: not applicable

## 2014-10-10 NOTE — Anesthesia Postprocedure Evaluation (Signed)
Anesthesia Post Note  Patient: [name]  Procedure(s) Performed: Procedure(s) (LRB): BONE ANCHORED HEARING AID (BAHA) IMPLANT TEMPORAL BONE RIGHT EYE (Right)  Anesthesia type: General  Patient location: PACU  Post pain: Pain level controlled  Post assessment: Post-op Vital signs reviewed  Last Vitals: BP 127/63 mmHg  Pulse 77  Temp(Src) 36.5 C (Oral)  Resp 16  Ht 5' 5.5" (1.664 m)  Wt 172 lb 6 oz (78.189 kg)  BMI 28.24 kg/m2  SpO2 99%  Post vital signs: Reviewed  Level of consciousness: sedated  Complications: No apparent anesthesia complications

## 2014-10-10 NOTE — Anesthesia Procedure Notes (Signed)
Procedure Name: LMA Insertion Date/Time: 10/10/2014 9:17 AM Performed by: Lyndee Leo Pre-anesthesia Checklist: Patient identified, Emergency Drugs available, Suction available and Patient being monitored Patient Re-evaluated:Patient Re-evaluated prior to inductionOxygen Delivery Method: Circle System Utilized Preoxygenation: Pre-oxygenation with 100% oxygen Intubation Type: IV induction Ventilation: Mask ventilation without difficulty LMA: LMA flexible inserted LMA Size: 4.0 Number of attempts: 1 Airway Equipment and Method: Bite block Placement Confirmation: positive ETCO2 Tube secured with: Tape Dental Injury: Teeth and Oropharynx as per pre-operative assessment

## 2014-10-10 NOTE — Transfer of Care (Signed)
Immediate Anesthesia Transfer of Care Note  Patient: [name]  Procedure(s) Performed: Procedure(s): BONE ANCHORED HEARING AID (BAHA) IMPLANT TEMPORAL BONE RIGHT EYE (Right)  Patient Location: PACU  Anesthesia Type:General  Level of Consciousness: awake, sedated and patient cooperative  Airway & Oxygen Therapy: Patient Spontanous Breathing and Patient connected to face mask oxygen  Post-op Assessment: Report given to RN and Post -op Vital signs reviewed and stable  Post vital signs: Reviewed and stable  Last Vitals:  Filed Vitals:   10/10/14 0803  BP: 132/77  Pulse: 73  Temp: 36.5 C  Resp: 18    Complications: No apparent anesthesia complications

## 2014-10-10 NOTE — Anesthesia Preprocedure Evaluation (Signed)
Anesthesia Evaluation  Patient identified by MRN, date of birth, ID band Patient awake    Reviewed: Allergy & Precautions, NPO status , Patient's Chart, lab work & pertinent test results  Airway Mallampati: II  TM Distance: >3 FB Neck ROM: Full    Dental no notable dental hx.    Pulmonary neg pulmonary ROS,    Pulmonary exam normal breath sounds clear to auscultation       Cardiovascular negative cardio ROS Normal cardiovascular exam Rhythm:Regular Rate:Normal     Neuro/Psych negative neurological ROS  negative psych ROS   GI/Hepatic negative GI ROS, Neg liver ROS,   Endo/Other  negative endocrine ROS  Renal/GU negative Renal ROS     Musculoskeletal negative musculoskeletal ROS (+)   Abdominal   Peds  Hematology negative hematology ROS (+)   Anesthesia Other Findings   Reproductive/Obstetrics negative OB ROS                             Anesthesia Physical Anesthesia Plan  ASA: I  Anesthesia Plan: General   Post-op Pain Management:    Induction: Intravenous  Airway Management Planned: Oral ETT and LMA  Additional Equipment:   Intra-op Plan:   Post-operative Plan: Extubation in OR  Informed Consent: I have reviewed the patients History and Physical, chart, labs and discussed the procedure including the risks, benefits and alternatives for the proposed anesthesia with the patient or authorized representative who has indicated his/her understanding and acceptance.   Dental advisory given  Plan Discussed with: CRNA  Anesthesia Plan Comments:         Anesthesia Quick Evaluation

## 2014-10-11 NOTE — Op Note (Signed)
NAME:  [name], [name] NO.:  [id number]  MEDICAL RECORD NO.:  [id number]  LOCATION:                               FACILITY:  [facility]  PHYSICIAN:  [name], M.D.    DATE OF BIRTH:  Oct 15, 1945  DATE OF PROCEDURE:  10/10/2014 DATE OF DISCHARGE:  10/10/2014                              OPERATIVE REPORT   JUSTIFICATION FOR PROCEDURE:  [name] is a 69 year old white female who is here today for right Baha hearing implant to treat bilateral hearing loss.  The patient has bilateral otosclerosis and underwent bilateral stapedectomies elsewhere by another surgeon.  She did not have a good result.  She has bilateral asymmetric hearing losses with moderate to moderately severe sensorineural hearing loss in the left ear with good discrimination and a moderately severe to severe mixed hearing loss in the right ear with poor discrimination.  She is status post bilateral failed stapedectomies as I mentioned.  Because of the above, she was recommended for a right Baha hearing implant under general LMA anesthesia as an outpatient.  Risks, complications, and alternatives of procedure were explained to her.  Questions were invited and answere, and informed consent was signed and witnessed.  She was trialed with a Baha5 headband and liked the sound quality.  JUSTIFICATION FOR OUTPATIENT SETTING:  The patient's age and need for general LMA anesthesia.  JUSTIFICATION FOR OVERNIGHT STAY:  Not applicable.  PREOPERATIVE DIAGNOSES:  Bilateral moderate to moderately severe hearing loss, sensorineural in the left ear with good discrimination and severe mixed hearing loss in the right ear with poor discrimination status post bilateral failed stapedectomies elsewhere by another surgeon.  POSTOPERATIVE DIAGNOSES:  Bilateral moderate to moderately severe hearing loss, sensorineural in the left ear with good discrimination and severe mixed hearing loss in the right ear with poor  discrimination status post bilateral failed stapedectomies elsewhere by another surgeon.  OPERATION:  Right Baha hearing implant ([id number] 14mm x 54mm)  SURGEON:  [name], M.D.  ANESTHESIA:  General LMA, Dr. Annye Asa.  CRNA:  [name].  COMPLICATIONS:  None.  DISCHARGE STATUS:  Stable.  SUMMARY OF REPORT:  After the patient was taken to the operating room #5, she was placed in the supine position.  An IV had been begun in the holding area.  General IV induction was performed by Dr. Dr. Annye Asa. The patient was orally intubated without difficulty with an LMA by [name].  She was properly positioned and monitored.  Elbows and ankles were padded with foam rubber, and I initiated a time-out at 9:31 a.m.  The patient was then turned 90 degrees counterclockwise.  A small amount of hair was clipped in the right postauricular area.  Hair was taped, and stockinette cap was applied.  The site for the Baha implant was marked with the aid of a Baha dummy template.  The device was placed 55 mm  posterior to the right external auditory canal to allow room for glasses and sunglasses. She was measured with several visors and 3 pairs of glasses.  The implant was placed 55 mm from the ear canal.  The skin surrounding the site was then infiltrated with 1 mL  of 1% Xylocaine with 1:100,000 epinephrine.  Her right ear, hemiface, and postauricular area were then prepped with Betadine and were draped in a standard fashion for a Baha implant.  The Baha implant site was then marked through the skin to the skull with methylene blue dye.  A 73mm dermatologic punch was then used to remove a 5-mm disc of skin at the implant site.  A cruciate incision was then made with a 15 blade, and 4 periosteal flaps were elevated.  A pilot hole was then drilled to a 3-mm depth at 2000 rpm's.  The hole was then deepened to 4 mm without difficulty with solid bone at the depth of the pilot site.  A 4-mm countersink  opening was then drilled with a 4-mm countersink at 2000 rpm's using continuous suction irrigation.  Great care was taken not to burn the bone.  A measurement was taken, and the patient measured to be a 10 mm length, therefore, 10 mm Baha implant,a BIA400, 4 mm x 10 mm implant, reference #9331, lot number [id number] was then opened and attached to the [place] drill with the abutment inserter.  The implant was then inserted into the 4-mm countersink and implanted at 45 Newton/cm of torque.  Great care was taken to make sure the device was perpendicular to the skin during the implantation. The implant was then hand tightened  1/8th of a turn.  Bacitracin ointment was applied around the base of the abutment.  A healing cap was applied, and quarter-inch iodoform Nu Gauze impregnated with bacitracin ointment was wound around the abutment medial to the healing cap.  The ear was padded with Telfa and cotton, and a standard adult Glasscock mastoid dressing was then applied loosely in a standard fashion.  The patient was then turned back toward anesthesia, and her left hearing aid was placed in her ear.  She was then awakened, extubated, and transferred to her hospital bed.  She appeared to tolerate both the general LMA anesthesia and the procedure well and left the operating room in stable condition.  TOTAL FLUIDS:  900 mL.  TOTAL BLOOD LOSS:  Less than 5 mL.  COUNTS:  Sponge, needle, and cotton ball counts were correct at the termination of procedure.  SPECIMENS:  No specimens were sent to pathology.  The patient received Ancef 2 g IV, Zofran 4 mg IV at the beginning; and end of the  procedure, Decadron 10 mg IV.  [name] will be admitted to the PACU, then will be discharged home today with her husband.  He will be instructed to have her return to my office on October 17, 2014, at 2:10 p.m. for followup.  DISCHARGE MEDICATIONS:  Include: Cefzil 500 mg p.o. b.i.d. x10 days  with food, Vicodin 5/300 one p.o. q.4-6 hours p.r.n. pain #15 tabs, and mupirocin ointment applied around the abutment b.i.d. x1 week.  She is to keep her head elevated on 2 pillows for the next week, avoid direct trauma over the implant, and avoid water exposure until she sees me in the office.  She is to call to [phone] for any postoperative problems directly related to the procedure.  Her husband will be given both verbal and written instructions.  SUMMARY:  The patient underwent an uncomplicated routine implantation of a right Baha hearing implant. A 10 mm x 4 mm BIA400 implant was implanted without difficulty.  There were no complications.  Sponge, needle, and cotton ball counts were correct at the termination of the procedure.  [name], M.D.    EMK/MEDQ  D:  10/10/2014  T:  10/11/2014  Job:  [id number]  cc:   Kathyrn Lass, M.D.

## 2014-10-12 ENCOUNTER — Encounter (HOSPITAL_BASED_OUTPATIENT_CLINIC_OR_DEPARTMENT_OTHER): Payer: Self-pay | Admitting: Otolaryngology

## 2014-10-30 DIAGNOSIS — M4854XD Collapsed vertebra, not elsewhere classified, thoracic region, subsequent encounter for fracture with routine healing: Secondary | ICD-10-CM | POA: Diagnosis not present

## 2014-10-30 DIAGNOSIS — Z23 Encounter for immunization: Secondary | ICD-10-CM | POA: Diagnosis not present

## 2014-11-21 DIAGNOSIS — H903 Sensorineural hearing loss, bilateral: Secondary | ICD-10-CM | POA: Diagnosis not present

## 2014-11-21 DIAGNOSIS — H6123 Impacted cerumen, bilateral: Secondary | ICD-10-CM | POA: Diagnosis not present

## 2014-11-21 DIAGNOSIS — H8003 Otosclerosis involving oval window, nonobliterative, bilateral: Secondary | ICD-10-CM | POA: Diagnosis not present

## 2015-03-11 DIAGNOSIS — H6123 Impacted cerumen, bilateral: Secondary | ICD-10-CM | POA: Diagnosis not present

## 2015-03-11 DIAGNOSIS — H8003 Otosclerosis involving oval window, nonobliterative, bilateral: Secondary | ICD-10-CM | POA: Diagnosis not present

## 2015-03-11 DIAGNOSIS — H903 Sensorineural hearing loss, bilateral: Secondary | ICD-10-CM | POA: Diagnosis not present

## 2015-04-08 DIAGNOSIS — Z85828 Personal history of other malignant neoplasm of skin: Secondary | ICD-10-CM | POA: Diagnosis not present

## 2015-04-08 DIAGNOSIS — L570 Actinic keratosis: Secondary | ICD-10-CM | POA: Diagnosis not present

## 2015-04-16 DIAGNOSIS — L821 Other seborrheic keratosis: Secondary | ICD-10-CM | POA: Diagnosis not present

## 2015-04-16 DIAGNOSIS — Z85828 Personal history of other malignant neoplasm of skin: Secondary | ICD-10-CM | POA: Diagnosis not present

## 2015-04-16 DIAGNOSIS — D485 Neoplasm of uncertain behavior of skin: Secondary | ICD-10-CM | POA: Diagnosis not present

## 2015-04-16 DIAGNOSIS — L570 Actinic keratosis: Secondary | ICD-10-CM | POA: Diagnosis not present

## 2015-07-24 DIAGNOSIS — Z803 Family history of malignant neoplasm of breast: Secondary | ICD-10-CM | POA: Diagnosis not present

## 2015-07-24 DIAGNOSIS — Z1231 Encounter for screening mammogram for malignant neoplasm of breast: Secondary | ICD-10-CM | POA: Diagnosis not present

## 2015-07-25 DIAGNOSIS — Z1159 Encounter for screening for other viral diseases: Secondary | ICD-10-CM | POA: Diagnosis not present

## 2015-07-25 DIAGNOSIS — E559 Vitamin D deficiency, unspecified: Secondary | ICD-10-CM | POA: Diagnosis not present

## 2015-07-25 DIAGNOSIS — Z131 Encounter for screening for diabetes mellitus: Secondary | ICD-10-CM | POA: Diagnosis not present

## 2015-07-25 DIAGNOSIS — Z0000 Encounter for general adult medical examination without abnormal findings: Secondary | ICD-10-CM | POA: Diagnosis not present

## 2015-07-25 DIAGNOSIS — E784 Other hyperlipidemia: Secondary | ICD-10-CM | POA: Diagnosis not present

## 2015-07-29 DIAGNOSIS — E669 Obesity, unspecified: Secondary | ICD-10-CM | POA: Diagnosis not present

## 2015-07-29 DIAGNOSIS — Z6828 Body mass index (BMI) 28.0-28.9, adult: Secondary | ICD-10-CM | POA: Diagnosis not present

## 2015-07-29 DIAGNOSIS — Z0000 Encounter for general adult medical examination without abnormal findings: Secondary | ICD-10-CM | POA: Diagnosis not present

## 2015-07-29 DIAGNOSIS — E559 Vitamin D deficiency, unspecified: Secondary | ICD-10-CM | POA: Diagnosis not present

## 2015-07-29 DIAGNOSIS — M859 Disorder of bone density and structure, unspecified: Secondary | ICD-10-CM | POA: Diagnosis not present

## 2015-07-29 DIAGNOSIS — H9190 Unspecified hearing loss, unspecified ear: Secondary | ICD-10-CM | POA: Diagnosis not present

## 2015-07-29 DIAGNOSIS — Z86010 Personal history of colonic polyps: Secondary | ICD-10-CM | POA: Diagnosis not present

## 2015-09-06 IMAGING — CT CT T SPINE W/O CM
2 series · 10 of 14 positions shown, 12 images · non-contrast
Comparison: 10/07/2012

CLINICAL DATA: Fall 2 days ago with persistent upper back pain,
initial encounter

EXAM:
CT THORACIC SPINE WITHOUT CONTRAST
TECHNIQUE: Multidetector CT imaging of the thoracic spine was performed without
intravenous contrast administration. Multiplanar CT image
reconstructions were also generated.

[Series 3: t spine soft · axial · 0.34mm/px · z∈[-823,-583]mm · 5 of 122 slices shown]
[im 21/122  soft-tissue]
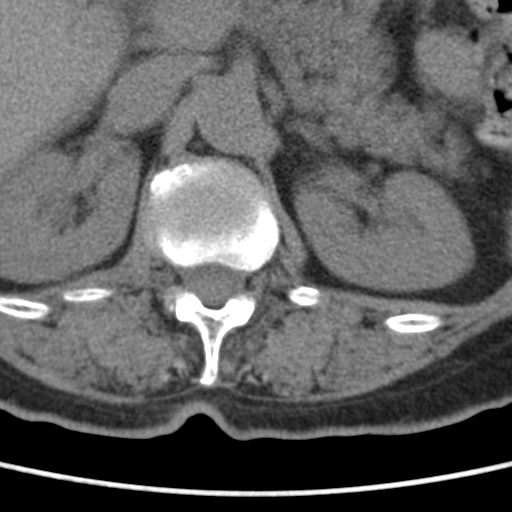
[im 41/122  soft-tissue]
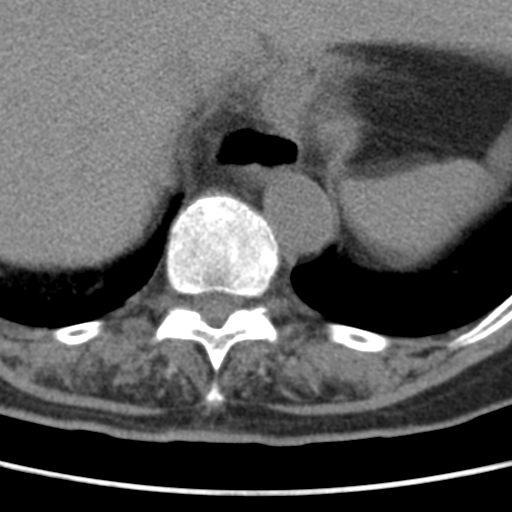
[im 61/122  soft-tissue]
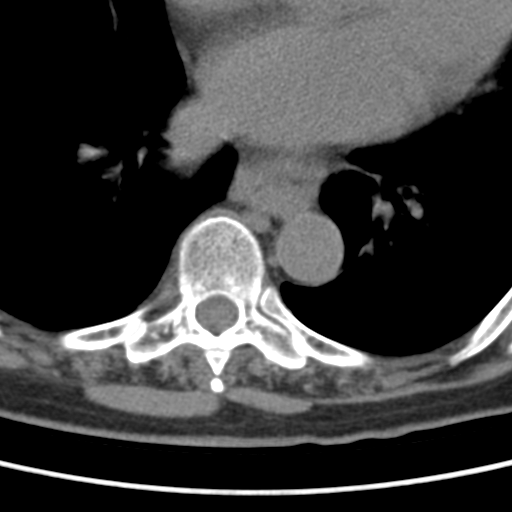
[im 81/122  soft-tissue]
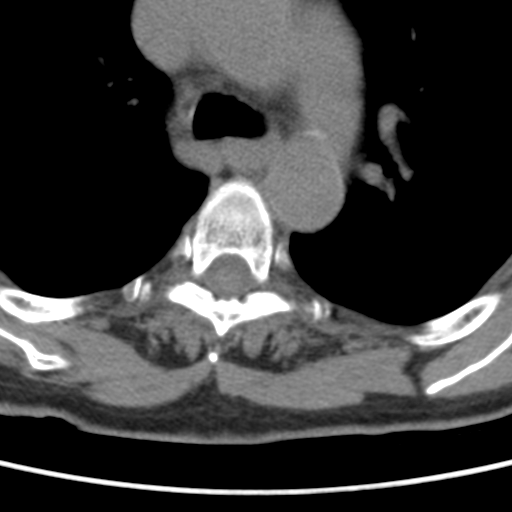
[im 101/122  soft-tissue]
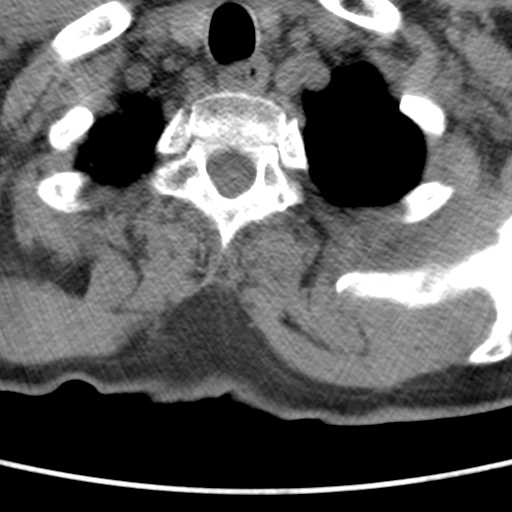

[Series 4: t spine bone · axial · 0.34mm/px · z∈[-806,-590]mm · 5 of 109 slices shown, 7 images]
[im 19/109  soft-tissue]
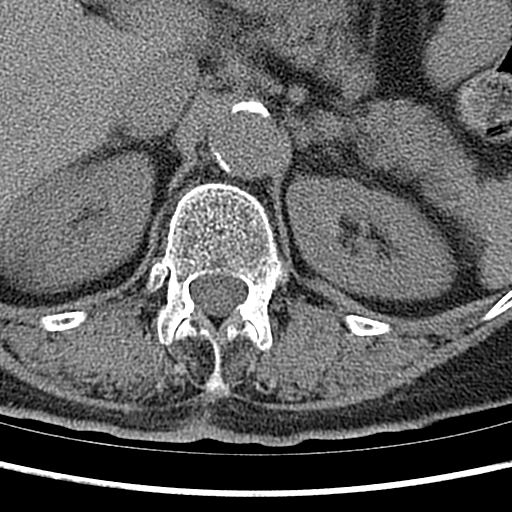
[im 19/109  bone]
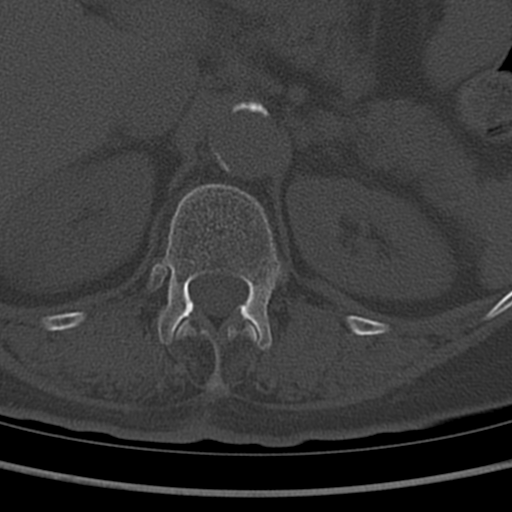
[im 37/109  bone]
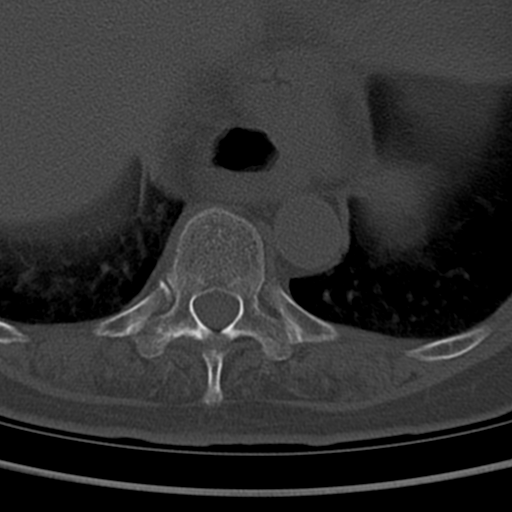
[im 55/109  bone]
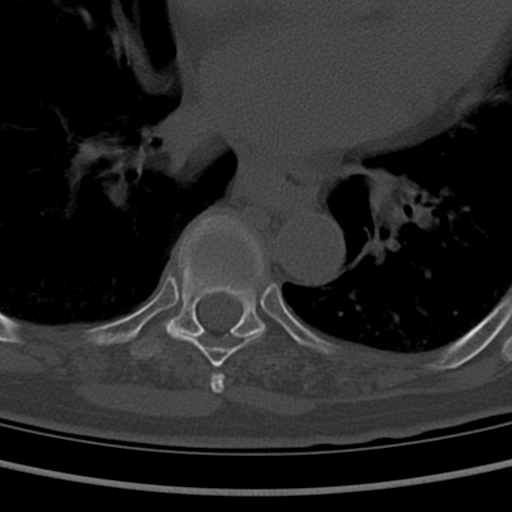
[im 73/109  bone]
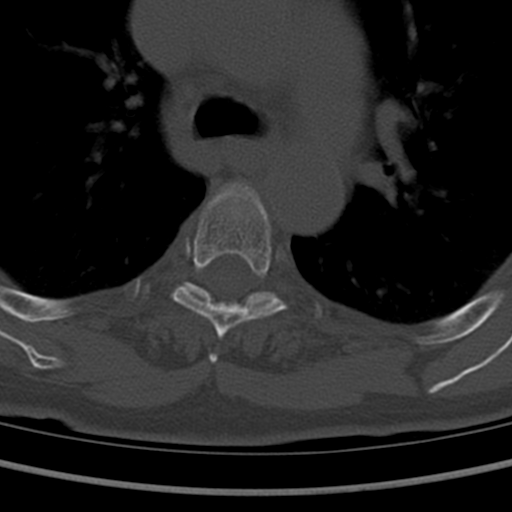
[im 91/109  soft-tissue]
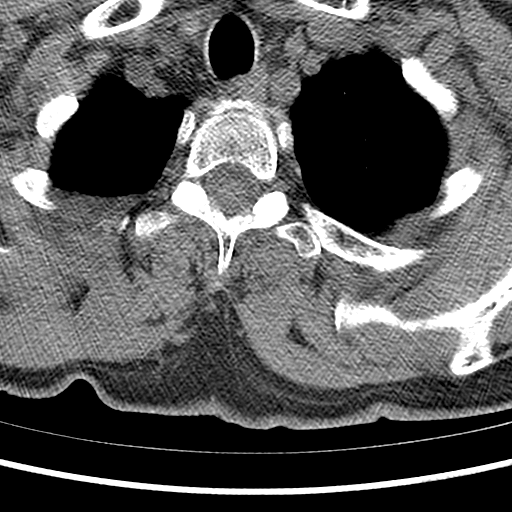
[im 91/109  bone]
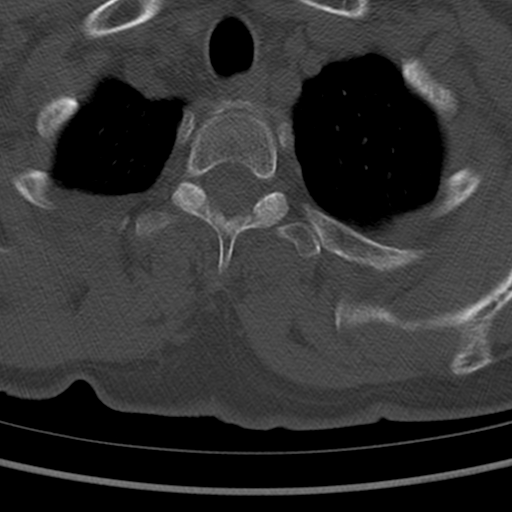

[10 of 14 positions shown; findings below may reference images not displayed]

FINDINGS: There are changes consistent with a fracture of the T6 vertebral
body involving predominately the inferior endplate. There is
approximately 50% vertebral body height loss inferiorly. No
significant paraspinal hematoma is noted. Mild degenerative changes
of the thoracic spine are noted. The visualized rib cage is within
normal limits. The lung fields as visualized are unremarkable. A
moderate-sized hiatal hernia is seen. No significant retropulsion is
noted at the fracture site. No significant central canal stenosis is
noted. No specific disc pathology is seen.
IMPRESSION: Acute compression fracture of T6 with approximately 50% vertebral
body height loss. No significant retropulsion is noted.

If the patient fails traditional medical management, she may be a
candidate for percutaneous vertebral augmentation.

## 2015-09-12 DIAGNOSIS — L570 Actinic keratosis: Secondary | ICD-10-CM | POA: Diagnosis not present

## 2015-09-12 DIAGNOSIS — Z85828 Personal history of other malignant neoplasm of skin: Secondary | ICD-10-CM | POA: Diagnosis not present

## 2015-09-12 DIAGNOSIS — L720 Epidermal cyst: Secondary | ICD-10-CM | POA: Diagnosis not present

## 2015-10-22 DIAGNOSIS — D045 Carcinoma in situ of skin of trunk: Secondary | ICD-10-CM | POA: Diagnosis not present

## 2015-10-22 DIAGNOSIS — L821 Other seborrheic keratosis: Secondary | ICD-10-CM | POA: Diagnosis not present

## 2015-10-22 DIAGNOSIS — L570 Actinic keratosis: Secondary | ICD-10-CM | POA: Diagnosis not present

## 2015-10-22 DIAGNOSIS — D485 Neoplasm of uncertain behavior of skin: Secondary | ICD-10-CM | POA: Diagnosis not present

## 2015-10-22 DIAGNOSIS — Z85828 Personal history of other malignant neoplasm of skin: Secondary | ICD-10-CM | POA: Diagnosis not present

## 2015-10-31 DIAGNOSIS — Z8719 Personal history of other diseases of the digestive system: Secondary | ICD-10-CM | POA: Diagnosis not present

## 2015-10-31 DIAGNOSIS — K635 Polyp of colon: Secondary | ICD-10-CM | POA: Diagnosis not present

## 2015-10-31 DIAGNOSIS — K5730 Diverticulosis of large intestine without perforation or abscess without bleeding: Secondary | ICD-10-CM | POA: Diagnosis not present

## 2015-10-31 DIAGNOSIS — Z1211 Encounter for screening for malignant neoplasm of colon: Secondary | ICD-10-CM | POA: Diagnosis not present

## 2015-10-31 DIAGNOSIS — Z86010 Personal history of colonic polyps: Secondary | ICD-10-CM | POA: Diagnosis not present

## 2015-10-31 DIAGNOSIS — D122 Benign neoplasm of ascending colon: Secondary | ICD-10-CM | POA: Diagnosis not present

## 2015-11-04 DIAGNOSIS — Z23 Encounter for immunization: Secondary | ICD-10-CM | POA: Diagnosis not present

## 2015-11-25 DIAGNOSIS — H6123 Impacted cerumen, bilateral: Secondary | ICD-10-CM | POA: Diagnosis not present

## 2015-11-25 DIAGNOSIS — H903 Sensorineural hearing loss, bilateral: Secondary | ICD-10-CM | POA: Diagnosis not present

## 2015-11-25 DIAGNOSIS — H8003 Otosclerosis involving oval window, nonobliterative, bilateral: Secondary | ICD-10-CM | POA: Diagnosis not present

## 2015-11-25 DIAGNOSIS — H8023 Cochlear otosclerosis, bilateral: Secondary | ICD-10-CM | POA: Diagnosis not present

## 2016-04-09 DIAGNOSIS — B350 Tinea barbae and tinea capitis: Secondary | ICD-10-CM | POA: Diagnosis not present

## 2016-04-22 DIAGNOSIS — L814 Other melanin hyperpigmentation: Secondary | ICD-10-CM | POA: Diagnosis not present

## 2016-04-22 DIAGNOSIS — L821 Other seborrheic keratosis: Secondary | ICD-10-CM | POA: Diagnosis not present

## 2016-04-22 DIAGNOSIS — L570 Actinic keratosis: Secondary | ICD-10-CM | POA: Diagnosis not present

## 2016-04-22 DIAGNOSIS — L438 Other lichen planus: Secondary | ICD-10-CM | POA: Diagnosis not present

## 2016-04-22 DIAGNOSIS — Z85828 Personal history of other malignant neoplasm of skin: Secondary | ICD-10-CM | POA: Diagnosis not present

## 2016-04-22 DIAGNOSIS — D1801 Hemangioma of skin and subcutaneous tissue: Secondary | ICD-10-CM | POA: Diagnosis not present

## 2016-06-17 DIAGNOSIS — H8023 Cochlear otosclerosis, bilateral: Secondary | ICD-10-CM | POA: Diagnosis not present

## 2016-06-17 DIAGNOSIS — H8003 Otosclerosis involving oval window, nonobliterative, bilateral: Secondary | ICD-10-CM | POA: Diagnosis not present

## 2016-06-17 DIAGNOSIS — H903 Sensorineural hearing loss, bilateral: Secondary | ICD-10-CM | POA: Diagnosis not present

## 2016-06-17 DIAGNOSIS — H6123 Impacted cerumen, bilateral: Secondary | ICD-10-CM | POA: Diagnosis not present

## 2016-07-20 DIAGNOSIS — H1089 Other conjunctivitis: Secondary | ICD-10-CM | POA: Diagnosis not present

## 2016-07-29 DIAGNOSIS — M8589 Other specified disorders of bone density and structure, multiple sites: Secondary | ICD-10-CM | POA: Diagnosis not present

## 2016-07-29 DIAGNOSIS — Z803 Family history of malignant neoplasm of breast: Secondary | ICD-10-CM | POA: Diagnosis not present

## 2016-07-29 DIAGNOSIS — Z1231 Encounter for screening mammogram for malignant neoplasm of breast: Secondary | ICD-10-CM | POA: Diagnosis not present

## 2016-07-31 ENCOUNTER — Ambulatory Visit (HOSPITAL_BASED_OUTPATIENT_CLINIC_OR_DEPARTMENT_OTHER)
Admission: RE | Admit: 2016-07-31 | Discharge: 2016-07-31 | Disposition: A | Discharge status: Home / Self Care | Payer: Medicare Other | Source: Ambulatory Visit | Attending: Family Medicine | Admitting: Family Medicine

## 2016-07-31 ENCOUNTER — Other Ambulatory Visit (HOSPITAL_BASED_OUTPATIENT_CLINIC_OR_DEPARTMENT_OTHER): Payer: Self-pay | Admitting: Family Medicine

## 2016-07-31 DIAGNOSIS — M7989 Other specified soft tissue disorders: Secondary | ICD-10-CM

## 2016-07-31 DIAGNOSIS — M79661 Pain in right lower leg: Secondary | ICD-10-CM | POA: Diagnosis not present

## 2016-08-04 DIAGNOSIS — M25561 Pain in right knee: Secondary | ICD-10-CM | POA: Diagnosis not present

## 2016-08-13 DIAGNOSIS — D0461 Carcinoma in situ of skin of right upper limb, including shoulder: Secondary | ICD-10-CM | POA: Diagnosis not present

## 2016-08-13 DIAGNOSIS — E611 Iron deficiency: Secondary | ICD-10-CM | POA: Diagnosis not present

## 2016-08-13 DIAGNOSIS — L570 Actinic keratosis: Secondary | ICD-10-CM | POA: Diagnosis not present

## 2016-08-13 DIAGNOSIS — C44519 Basal cell carcinoma of skin of other part of trunk: Secondary | ICD-10-CM | POA: Diagnosis not present

## 2016-08-13 DIAGNOSIS — Z0000 Encounter for general adult medical examination without abnormal findings: Secondary | ICD-10-CM | POA: Diagnosis not present

## 2016-08-13 DIAGNOSIS — R7303 Prediabetes: Secondary | ICD-10-CM | POA: Diagnosis not present

## 2016-08-13 DIAGNOSIS — E559 Vitamin D deficiency, unspecified: Secondary | ICD-10-CM | POA: Diagnosis not present

## 2016-08-13 DIAGNOSIS — Z85828 Personal history of other malignant neoplasm of skin: Secondary | ICD-10-CM | POA: Diagnosis not present

## 2016-08-13 DIAGNOSIS — D485 Neoplasm of uncertain behavior of skin: Secondary | ICD-10-CM | POA: Diagnosis not present

## 2016-08-18 DIAGNOSIS — M25561 Pain in right knee: Secondary | ICD-10-CM | POA: Diagnosis not present

## 2016-08-19 DIAGNOSIS — Z6829 Body mass index (BMI) 29.0-29.9, adult: Secondary | ICD-10-CM | POA: Diagnosis not present

## 2016-08-19 DIAGNOSIS — M859 Disorder of bone density and structure, unspecified: Secondary | ICD-10-CM | POA: Diagnosis not present

## 2016-08-19 DIAGNOSIS — E663 Overweight: Secondary | ICD-10-CM | POA: Diagnosis not present

## 2016-08-19 DIAGNOSIS — Z0000 Encounter for general adult medical examination without abnormal findings: Secondary | ICD-10-CM | POA: Diagnosis not present

## 2016-08-19 DIAGNOSIS — E559 Vitamin D deficiency, unspecified: Secondary | ICD-10-CM | POA: Diagnosis not present

## 2016-09-11 DIAGNOSIS — S76111A Strain of right quadriceps muscle, fascia and tendon, initial encounter: Secondary | ICD-10-CM | POA: Diagnosis not present

## 2016-09-25 DIAGNOSIS — S76111D Strain of right quadriceps muscle, fascia and tendon, subsequent encounter: Secondary | ICD-10-CM | POA: Diagnosis not present

## 2016-09-28 DIAGNOSIS — S76111D Strain of right quadriceps muscle, fascia and tendon, subsequent encounter: Secondary | ICD-10-CM | POA: Diagnosis not present

## 2016-09-29 DIAGNOSIS — C44511 Basal cell carcinoma of skin of breast: Secondary | ICD-10-CM | POA: Diagnosis not present

## 2016-09-30 DIAGNOSIS — S76111D Strain of right quadriceps muscle, fascia and tendon, subsequent encounter: Secondary | ICD-10-CM | POA: Diagnosis not present

## 2016-10-07 DIAGNOSIS — S76111D Strain of right quadriceps muscle, fascia and tendon, subsequent encounter: Secondary | ICD-10-CM | POA: Diagnosis not present

## 2016-10-09 DIAGNOSIS — S76111D Strain of right quadriceps muscle, fascia and tendon, subsequent encounter: Secondary | ICD-10-CM | POA: Diagnosis not present

## 2016-10-13 DIAGNOSIS — S76111D Strain of right quadriceps muscle, fascia and tendon, subsequent encounter: Secondary | ICD-10-CM | POA: Diagnosis not present

## 2016-10-15 DIAGNOSIS — S76111D Strain of right quadriceps muscle, fascia and tendon, subsequent encounter: Secondary | ICD-10-CM | POA: Diagnosis not present

## 2016-10-16 DIAGNOSIS — S76111D Strain of right quadriceps muscle, fascia and tendon, subsequent encounter: Secondary | ICD-10-CM | POA: Diagnosis not present

## 2016-10-22 DIAGNOSIS — S76111D Strain of right quadriceps muscle, fascia and tendon, subsequent encounter: Secondary | ICD-10-CM | POA: Diagnosis not present

## 2016-10-26 DIAGNOSIS — S76111D Strain of right quadriceps muscle, fascia and tendon, subsequent encounter: Secondary | ICD-10-CM | POA: Diagnosis not present

## 2016-10-27 DIAGNOSIS — L570 Actinic keratosis: Secondary | ICD-10-CM | POA: Diagnosis not present

## 2016-10-27 DIAGNOSIS — D1801 Hemangioma of skin and subcutaneous tissue: Secondary | ICD-10-CM | POA: Diagnosis not present

## 2016-10-27 DIAGNOSIS — L821 Other seborrheic keratosis: Secondary | ICD-10-CM | POA: Diagnosis not present

## 2016-10-27 DIAGNOSIS — Z85828 Personal history of other malignant neoplasm of skin: Secondary | ICD-10-CM | POA: Diagnosis not present

## 2016-10-28 DIAGNOSIS — S76111D Strain of right quadriceps muscle, fascia and tendon, subsequent encounter: Secondary | ICD-10-CM | POA: Diagnosis not present

## 2016-11-02 DIAGNOSIS — S76111D Strain of right quadriceps muscle, fascia and tendon, subsequent encounter: Secondary | ICD-10-CM | POA: Diagnosis not present

## 2016-11-03 DIAGNOSIS — Z23 Encounter for immunization: Secondary | ICD-10-CM | POA: Diagnosis not present

## 2016-11-05 DIAGNOSIS — S76111D Strain of right quadriceps muscle, fascia and tendon, subsequent encounter: Secondary | ICD-10-CM | POA: Diagnosis not present

## 2016-11-10 DIAGNOSIS — S76111D Strain of right quadriceps muscle, fascia and tendon, subsequent encounter: Secondary | ICD-10-CM | POA: Diagnosis not present

## 2016-11-12 DIAGNOSIS — S76111D Strain of right quadriceps muscle, fascia and tendon, subsequent encounter: Secondary | ICD-10-CM | POA: Diagnosis not present

## 2016-11-17 DIAGNOSIS — S76111D Strain of right quadriceps muscle, fascia and tendon, subsequent encounter: Secondary | ICD-10-CM | POA: Diagnosis not present

## 2016-11-19 DIAGNOSIS — S76111D Strain of right quadriceps muscle, fascia and tendon, subsequent encounter: Secondary | ICD-10-CM | POA: Diagnosis not present

## 2016-11-24 DIAGNOSIS — S76111D Strain of right quadriceps muscle, fascia and tendon, subsequent encounter: Secondary | ICD-10-CM | POA: Diagnosis not present

## 2016-11-26 DIAGNOSIS — S76111D Strain of right quadriceps muscle, fascia and tendon, subsequent encounter: Secondary | ICD-10-CM | POA: Diagnosis not present

## 2016-12-01 DIAGNOSIS — S76111D Strain of right quadriceps muscle, fascia and tendon, subsequent encounter: Secondary | ICD-10-CM | POA: Diagnosis not present

## 2017-04-22 DIAGNOSIS — Z8349 Family history of other endocrine, nutritional and metabolic diseases: Secondary | ICD-10-CM | POA: Diagnosis not present

## 2017-04-22 DIAGNOSIS — R251 Tremor, unspecified: Secondary | ICD-10-CM | POA: Diagnosis not present

## 2017-04-22 DIAGNOSIS — E559 Vitamin D deficiency, unspecified: Secondary | ICD-10-CM | POA: Diagnosis not present

## 2017-04-22 DIAGNOSIS — L723 Sebaceous cyst: Secondary | ICD-10-CM | POA: Diagnosis not present

## 2017-04-22 DIAGNOSIS — R61 Generalized hyperhidrosis: Secondary | ICD-10-CM | POA: Diagnosis not present

## 2017-04-22 DIAGNOSIS — B351 Tinea unguium: Secondary | ICD-10-CM | POA: Diagnosis not present

## 2017-04-22 DIAGNOSIS — Z79899 Other long term (current) drug therapy: Secondary | ICD-10-CM | POA: Diagnosis not present

## 2017-04-27 DIAGNOSIS — L570 Actinic keratosis: Secondary | ICD-10-CM | POA: Diagnosis not present

## 2017-04-27 DIAGNOSIS — D1801 Hemangioma of skin and subcutaneous tissue: Secondary | ICD-10-CM | POA: Diagnosis not present

## 2017-04-27 DIAGNOSIS — L820 Inflamed seborrheic keratosis: Secondary | ICD-10-CM | POA: Diagnosis not present

## 2017-04-27 DIAGNOSIS — Z85828 Personal history of other malignant neoplasm of skin: Secondary | ICD-10-CM | POA: Diagnosis not present

## 2017-04-27 DIAGNOSIS — L821 Other seborrheic keratosis: Secondary | ICD-10-CM | POA: Diagnosis not present

## 2017-07-15 DIAGNOSIS — H8023 Cochlear otosclerosis, bilateral: Secondary | ICD-10-CM | POA: Diagnosis not present

## 2017-07-15 DIAGNOSIS — H6123 Impacted cerumen, bilateral: Secondary | ICD-10-CM | POA: Diagnosis not present

## 2017-07-15 DIAGNOSIS — H8003 Otosclerosis involving oval window, nonobliterative, bilateral: Secondary | ICD-10-CM | POA: Diagnosis not present

## 2017-07-15 DIAGNOSIS — H903 Sensorineural hearing loss, bilateral: Secondary | ICD-10-CM | POA: Diagnosis not present

## 2017-08-11 DIAGNOSIS — Z1231 Encounter for screening mammogram for malignant neoplasm of breast: Secondary | ICD-10-CM | POA: Diagnosis not present

## 2017-08-11 DIAGNOSIS — M25511 Pain in right shoulder: Secondary | ICD-10-CM | POA: Diagnosis not present

## 2017-08-11 DIAGNOSIS — Z803 Family history of malignant neoplasm of breast: Secondary | ICD-10-CM | POA: Diagnosis not present

## 2017-08-18 DIAGNOSIS — Z13220 Encounter for screening for lipoid disorders: Secondary | ICD-10-CM | POA: Diagnosis not present

## 2017-08-18 DIAGNOSIS — Z0000 Encounter for general adult medical examination without abnormal findings: Secondary | ICD-10-CM | POA: Diagnosis not present

## 2017-08-18 DIAGNOSIS — E7800 Pure hypercholesterolemia, unspecified: Secondary | ICD-10-CM | POA: Diagnosis not present

## 2017-08-18 DIAGNOSIS — E559 Vitamin D deficiency, unspecified: Secondary | ICD-10-CM | POA: Diagnosis not present

## 2017-08-24 DIAGNOSIS — D72829 Elevated white blood cell count, unspecified: Secondary | ICD-10-CM | POA: Diagnosis not present

## 2017-08-24 DIAGNOSIS — R197 Diarrhea, unspecified: Secondary | ICD-10-CM | POA: Diagnosis not present

## 2017-08-24 DIAGNOSIS — R112 Nausea with vomiting, unspecified: Secondary | ICD-10-CM | POA: Diagnosis not present

## 2017-08-30 DIAGNOSIS — E669 Obesity, unspecified: Secondary | ICD-10-CM | POA: Diagnosis not present

## 2017-08-30 DIAGNOSIS — H9190 Unspecified hearing loss, unspecified ear: Secondary | ICD-10-CM | POA: Diagnosis not present

## 2017-08-30 DIAGNOSIS — Z1389 Encounter for screening for other disorder: Secondary | ICD-10-CM | POA: Diagnosis not present

## 2017-08-30 DIAGNOSIS — M859 Disorder of bone density and structure, unspecified: Secondary | ICD-10-CM | POA: Diagnosis not present

## 2017-08-30 DIAGNOSIS — Z0000 Encounter for general adult medical examination without abnormal findings: Secondary | ICD-10-CM | POA: Diagnosis not present

## 2017-08-30 DIAGNOSIS — E559 Vitamin D deficiency, unspecified: Secondary | ICD-10-CM | POA: Diagnosis not present

## 2017-10-02 DIAGNOSIS — S61221A Laceration with foreign body of left index finger without damage to nail, initial encounter: Secondary | ICD-10-CM | POA: Diagnosis not present

## 2017-10-02 DIAGNOSIS — S60451A Superficial foreign body of left index finger, initial encounter: Secondary | ICD-10-CM | POA: Diagnosis not present

## 2017-11-11 DIAGNOSIS — Z23 Encounter for immunization: Secondary | ICD-10-CM | POA: Diagnosis not present

## 2017-12-08 DIAGNOSIS — L821 Other seborrheic keratosis: Secondary | ICD-10-CM | POA: Diagnosis not present

## 2017-12-08 DIAGNOSIS — L570 Actinic keratosis: Secondary | ICD-10-CM | POA: Diagnosis not present

## 2017-12-08 DIAGNOSIS — Z85828 Personal history of other malignant neoplasm of skin: Secondary | ICD-10-CM | POA: Diagnosis not present

## 2017-12-08 DIAGNOSIS — D1801 Hemangioma of skin and subcutaneous tissue: Secondary | ICD-10-CM | POA: Diagnosis not present

## 2017-12-08 DIAGNOSIS — L820 Inflamed seborrheic keratosis: Secondary | ICD-10-CM | POA: Diagnosis not present

## 2018-02-10 DIAGNOSIS — H25013 Cortical age-related cataract, bilateral: Secondary | ICD-10-CM | POA: Diagnosis not present

## 2018-02-10 DIAGNOSIS — H5203 Hypermetropia, bilateral: Secondary | ICD-10-CM | POA: Diagnosis not present

## 2018-02-10 DIAGNOSIS — H52201 Unspecified astigmatism, right eye: Secondary | ICD-10-CM | POA: Diagnosis not present

## 2018-02-10 DIAGNOSIS — H2513 Age-related nuclear cataract, bilateral: Secondary | ICD-10-CM | POA: Diagnosis not present

## 2018-02-10 DIAGNOSIS — H524 Presbyopia: Secondary | ICD-10-CM | POA: Diagnosis not present

## 2018-02-14 DIAGNOSIS — H8003 Otosclerosis involving oval window, nonobliterative, bilateral: Secondary | ICD-10-CM | POA: Diagnosis not present

## 2018-02-14 DIAGNOSIS — H8023 Cochlear otosclerosis, bilateral: Secondary | ICD-10-CM | POA: Diagnosis not present

## 2018-02-14 DIAGNOSIS — H6123 Impacted cerumen, bilateral: Secondary | ICD-10-CM | POA: Diagnosis not present

## 2018-02-14 DIAGNOSIS — H903 Sensorineural hearing loss, bilateral: Secondary | ICD-10-CM | POA: Diagnosis not present

## 2018-03-30 DIAGNOSIS — C44529 Squamous cell carcinoma of skin of other part of trunk: Secondary | ICD-10-CM | POA: Diagnosis not present

## 2018-03-30 DIAGNOSIS — D485 Neoplasm of uncertain behavior of skin: Secondary | ICD-10-CM | POA: Diagnosis not present

## 2018-03-30 DIAGNOSIS — Z85828 Personal history of other malignant neoplasm of skin: Secondary | ICD-10-CM | POA: Diagnosis not present

## 2018-06-14 DIAGNOSIS — L821 Other seborrheic keratosis: Secondary | ICD-10-CM | POA: Diagnosis not present

## 2018-06-14 DIAGNOSIS — L570 Actinic keratosis: Secondary | ICD-10-CM | POA: Diagnosis not present

## 2018-06-14 DIAGNOSIS — C44722 Squamous cell carcinoma of skin of right lower limb, including hip: Secondary | ICD-10-CM | POA: Diagnosis not present

## 2018-06-14 DIAGNOSIS — C44622 Squamous cell carcinoma of skin of right upper limb, including shoulder: Secondary | ICD-10-CM | POA: Diagnosis not present

## 2018-06-14 DIAGNOSIS — Z85828 Personal history of other malignant neoplasm of skin: Secondary | ICD-10-CM | POA: Diagnosis not present

## 2018-06-14 DIAGNOSIS — D485 Neoplasm of uncertain behavior of skin: Secondary | ICD-10-CM | POA: Diagnosis not present

## 2018-07-26 DIAGNOSIS — R0981 Nasal congestion: Secondary | ICD-10-CM | POA: Diagnosis not present

## 2018-07-26 DIAGNOSIS — R197 Diarrhea, unspecified: Secondary | ICD-10-CM | POA: Diagnosis not present

## 2018-07-26 DIAGNOSIS — K5900 Constipation, unspecified: Secondary | ICD-10-CM | POA: Diagnosis not present

## 2018-07-26 DIAGNOSIS — R5381 Other malaise: Secondary | ICD-10-CM | POA: Diagnosis not present

## 2018-07-27 DIAGNOSIS — Z20828 Contact with and (suspected) exposure to other viral communicable diseases: Secondary | ICD-10-CM | POA: Diagnosis not present

## 2018-08-11 DIAGNOSIS — Z03818 Encounter for observation for suspected exposure to other biological agents ruled out: Secondary | ICD-10-CM | POA: Diagnosis not present

## 2018-09-13 DIAGNOSIS — E7849 Other hyperlipidemia: Secondary | ICD-10-CM | POA: Diagnosis not present

## 2018-09-13 DIAGNOSIS — D649 Anemia, unspecified: Secondary | ICD-10-CM | POA: Diagnosis not present

## 2018-09-14 DIAGNOSIS — M8589 Other specified disorders of bone density and structure, multiple sites: Secondary | ICD-10-CM | POA: Diagnosis not present

## 2018-09-14 DIAGNOSIS — Z1231 Encounter for screening mammogram for malignant neoplasm of breast: Secondary | ICD-10-CM | POA: Diagnosis not present

## 2018-09-14 DIAGNOSIS — R29890 Loss of height: Secondary | ICD-10-CM | POA: Diagnosis not present

## 2018-09-14 DIAGNOSIS — R635 Abnormal weight gain: Secondary | ICD-10-CM | POA: Diagnosis not present

## 2018-09-15 DIAGNOSIS — Z822 Family history of deafness and hearing loss: Secondary | ICD-10-CM | POA: Diagnosis not present

## 2018-09-15 DIAGNOSIS — Z9621 Cochlear implant status: Secondary | ICD-10-CM | POA: Diagnosis not present

## 2018-09-15 DIAGNOSIS — H906 Mixed conductive and sensorineural hearing loss, bilateral: Secondary | ICD-10-CM | POA: Diagnosis not present

## 2018-09-19 DIAGNOSIS — E7849 Other hyperlipidemia: Secondary | ICD-10-CM | POA: Diagnosis not present

## 2018-09-19 DIAGNOSIS — Z86010 Personal history of colonic polyps: Secondary | ICD-10-CM | POA: Diagnosis not present

## 2018-09-19 DIAGNOSIS — H9190 Unspecified hearing loss, unspecified ear: Secondary | ICD-10-CM | POA: Diagnosis not present

## 2018-09-19 DIAGNOSIS — Z6833 Body mass index (BMI) 33.0-33.9, adult: Secondary | ICD-10-CM | POA: Diagnosis not present

## 2018-09-19 DIAGNOSIS — Z0000 Encounter for general adult medical examination without abnormal findings: Secondary | ICD-10-CM | POA: Diagnosis not present

## 2018-09-19 DIAGNOSIS — E669 Obesity, unspecified: Secondary | ICD-10-CM | POA: Diagnosis not present

## 2018-09-19 DIAGNOSIS — D508 Other iron deficiency anemias: Secondary | ICD-10-CM | POA: Diagnosis not present

## 2018-10-18 DIAGNOSIS — H9190 Unspecified hearing loss, unspecified ear: Secondary | ICD-10-CM | POA: Diagnosis not present

## 2018-10-18 DIAGNOSIS — E669 Obesity, unspecified: Secondary | ICD-10-CM | POA: Diagnosis not present

## 2018-10-18 DIAGNOSIS — R5381 Other malaise: Secondary | ICD-10-CM | POA: Diagnosis not present

## 2018-10-18 DIAGNOSIS — D508 Other iron deficiency anemias: Secondary | ICD-10-CM | POA: Diagnosis not present

## 2018-10-18 DIAGNOSIS — R197 Diarrhea, unspecified: Secondary | ICD-10-CM | POA: Diagnosis not present

## 2018-10-18 DIAGNOSIS — M859 Disorder of bone density and structure, unspecified: Secondary | ICD-10-CM | POA: Diagnosis not present

## 2018-10-18 DIAGNOSIS — E559 Vitamin D deficiency, unspecified: Secondary | ICD-10-CM | POA: Diagnosis not present

## 2018-10-18 DIAGNOSIS — D649 Anemia, unspecified: Secondary | ICD-10-CM | POA: Diagnosis not present

## 2018-10-18 DIAGNOSIS — Z23 Encounter for immunization: Secondary | ICD-10-CM | POA: Diagnosis not present

## 2018-10-18 DIAGNOSIS — K5900 Constipation, unspecified: Secondary | ICD-10-CM | POA: Diagnosis not present

## 2018-10-18 DIAGNOSIS — E7849 Other hyperlipidemia: Secondary | ICD-10-CM | POA: Diagnosis not present

## 2018-11-03 DIAGNOSIS — H25013 Cortical age-related cataract, bilateral: Secondary | ICD-10-CM | POA: Diagnosis not present

## 2018-11-03 DIAGNOSIS — H524 Presbyopia: Secondary | ICD-10-CM | POA: Diagnosis not present

## 2018-11-03 DIAGNOSIS — H5203 Hypermetropia, bilateral: Secondary | ICD-10-CM | POA: Diagnosis not present

## 2018-11-03 DIAGNOSIS — H2513 Age-related nuclear cataract, bilateral: Secondary | ICD-10-CM | POA: Diagnosis not present

## 2018-12-07 DIAGNOSIS — H25813 Combined forms of age-related cataract, bilateral: Secondary | ICD-10-CM | POA: Diagnosis not present

## 2018-12-21 DIAGNOSIS — H2512 Age-related nuclear cataract, left eye: Secondary | ICD-10-CM | POA: Diagnosis not present

## 2018-12-21 DIAGNOSIS — H25012 Cortical age-related cataract, left eye: Secondary | ICD-10-CM | POA: Diagnosis not present

## 2018-12-30 DIAGNOSIS — Z20828 Contact with and (suspected) exposure to other viral communicable diseases: Secondary | ICD-10-CM | POA: Diagnosis not present

## 2019-01-04 DIAGNOSIS — H2511 Age-related nuclear cataract, right eye: Secondary | ICD-10-CM | POA: Diagnosis not present

## 2019-01-04 DIAGNOSIS — H25011 Cortical age-related cataract, right eye: Secondary | ICD-10-CM | POA: Diagnosis not present

## 2019-02-09 DIAGNOSIS — L821 Other seborrheic keratosis: Secondary | ICD-10-CM | POA: Diagnosis not present

## 2019-02-09 DIAGNOSIS — L570 Actinic keratosis: Secondary | ICD-10-CM | POA: Diagnosis not present

## 2019-02-09 DIAGNOSIS — Z85828 Personal history of other malignant neoplasm of skin: Secondary | ICD-10-CM | POA: Diagnosis not present

## 2019-02-09 DIAGNOSIS — L820 Inflamed seborrheic keratosis: Secondary | ICD-10-CM | POA: Diagnosis not present

## 2019-02-09 DIAGNOSIS — D1801 Hemangioma of skin and subcutaneous tissue: Secondary | ICD-10-CM | POA: Diagnosis not present

## 2019-02-11 ENCOUNTER — Ambulatory Visit: Payer: Medicare Other | Attending: Internal Medicine

## 2019-02-11 DIAGNOSIS — Z23 Encounter for immunization: Secondary | ICD-10-CM | POA: Diagnosis not present

## 2019-02-11 NOTE — Progress Notes (Signed)
   Covid-19 Vaccination Clinic  Name:  [name]    MRN: [id number] DOB: April 15, 1945  02/11/2019  [name] was observed post Covid-19 immunization for 15 minutes without incidence. She was provided with Vaccine Information Sheet and instruction to access the V-Safe system.   [name] was instructed to call 911 with any severe reactions post vaccine: Marland Kitchen Difficulty breathing  . Swelling of your face and throat  . A fast heartbeat  . A bad rash all over your body  . Dizziness and weakness    Immunizations Administered    Name Date Dose VIS Date [place] COVID-19 Vaccine 02/11/2019  2:05 PM 0.3 mL 01/13/2019 Intramuscular   Manufacturer: [place]   Lot: [id number]   [place]: [id number]

## 2019-03-02 ENCOUNTER — Ambulatory Visit: Payer: Medicare Other

## 2019-03-04 ENCOUNTER — Ambulatory Visit: Payer: Medicare Other | Attending: Internal Medicine

## 2019-03-04 DIAGNOSIS — Z23 Encounter for immunization: Secondary | ICD-10-CM

## 2019-03-04 NOTE — Progress Notes (Signed)
   Covid-19 Vaccination Clinic  Name:  [name]    MRN: [id number] DOB: 1945-08-11  03/04/2019  Ms. Week was observed post Covid-19 immunization for 15 minutes without incidence. She was provided with Vaccine Information Sheet and instruction to access the V-Safe system.   [name] was instructed to call 911 with any severe reactions post vaccine: Marland Kitchen Difficulty breathing  . Swelling of your face and throat  . A fast heartbeat  . A bad rash all over your body  . Dizziness and weakness    Immunizations Administered    Name Date Dose VIS Date Route   Pfizer COVID-19 Vaccine 03/04/2019  8:12 AM 0.3 mL 01/13/2019 Intramuscular   Manufacturer: [place]   Lot: [id number]   [place]: [id number]

## 2019-05-09 DIAGNOSIS — H906 Mixed conductive and sensorineural hearing loss, bilateral: Secondary | ICD-10-CM | POA: Diagnosis not present

## 2019-05-09 DIAGNOSIS — Z961 Presence of intraocular lens: Secondary | ICD-10-CM | POA: Diagnosis not present

## 2019-05-12 DIAGNOSIS — M76812 Anterior tibial syndrome, left leg: Secondary | ICD-10-CM | POA: Diagnosis not present

## 2019-05-12 DIAGNOSIS — M7732 Calcaneal spur, left foot: Secondary | ICD-10-CM | POA: Diagnosis not present

## 2019-05-16 DIAGNOSIS — M79672 Pain in left foot: Secondary | ICD-10-CM | POA: Diagnosis not present

## 2019-05-16 DIAGNOSIS — M25572 Pain in left ankle and joints of left foot: Secondary | ICD-10-CM | POA: Diagnosis not present

## 2019-05-16 DIAGNOSIS — M25672 Stiffness of left ankle, not elsewhere classified: Secondary | ICD-10-CM | POA: Diagnosis not present

## 2019-05-16 DIAGNOSIS — R262 Difficulty in walking, not elsewhere classified: Secondary | ICD-10-CM | POA: Diagnosis not present

## 2019-05-16 DIAGNOSIS — M25675 Stiffness of left foot, not elsewhere classified: Secondary | ICD-10-CM | POA: Diagnosis not present

## 2019-05-18 DIAGNOSIS — M25572 Pain in left ankle and joints of left foot: Secondary | ICD-10-CM | POA: Diagnosis not present

## 2019-05-18 DIAGNOSIS — M79672 Pain in left foot: Secondary | ICD-10-CM | POA: Diagnosis not present

## 2019-05-18 DIAGNOSIS — M25672 Stiffness of left ankle, not elsewhere classified: Secondary | ICD-10-CM | POA: Diagnosis not present

## 2019-05-18 DIAGNOSIS — R262 Difficulty in walking, not elsewhere classified: Secondary | ICD-10-CM | POA: Diagnosis not present

## 2019-05-18 DIAGNOSIS — M25675 Stiffness of left foot, not elsewhere classified: Secondary | ICD-10-CM | POA: Diagnosis not present

## 2019-05-19 DIAGNOSIS — M25572 Pain in left ankle and joints of left foot: Secondary | ICD-10-CM | POA: Diagnosis not present

## 2019-05-19 DIAGNOSIS — R262 Difficulty in walking, not elsewhere classified: Secondary | ICD-10-CM | POA: Diagnosis not present

## 2019-05-19 DIAGNOSIS — M25672 Stiffness of left ankle, not elsewhere classified: Secondary | ICD-10-CM | POA: Diagnosis not present

## 2019-05-19 DIAGNOSIS — M25675 Stiffness of left foot, not elsewhere classified: Secondary | ICD-10-CM | POA: Diagnosis not present

## 2019-05-19 DIAGNOSIS — M79672 Pain in left foot: Secondary | ICD-10-CM | POA: Diagnosis not present

## 2019-05-22 DIAGNOSIS — L570 Actinic keratosis: Secondary | ICD-10-CM | POA: Diagnosis not present

## 2019-05-22 DIAGNOSIS — D485 Neoplasm of uncertain behavior of skin: Secondary | ICD-10-CM | POA: Diagnosis not present

## 2019-05-22 DIAGNOSIS — D044 Carcinoma in situ of skin of scalp and neck: Secondary | ICD-10-CM | POA: Diagnosis not present

## 2019-05-22 DIAGNOSIS — Z85828 Personal history of other malignant neoplasm of skin: Secondary | ICD-10-CM | POA: Diagnosis not present

## 2019-05-22 DIAGNOSIS — L720 Epidermal cyst: Secondary | ICD-10-CM | POA: Diagnosis not present

## 2019-05-22 DIAGNOSIS — C44329 Squamous cell carcinoma of skin of other parts of face: Secondary | ICD-10-CM | POA: Diagnosis not present

## 2019-06-02 DIAGNOSIS — M79672 Pain in left foot: Secondary | ICD-10-CM | POA: Diagnosis not present

## 2019-06-02 DIAGNOSIS — M25672 Stiffness of left ankle, not elsewhere classified: Secondary | ICD-10-CM | POA: Diagnosis not present

## 2019-06-02 DIAGNOSIS — R262 Difficulty in walking, not elsewhere classified: Secondary | ICD-10-CM | POA: Diagnosis not present

## 2019-06-02 DIAGNOSIS — M25675 Stiffness of left foot, not elsewhere classified: Secondary | ICD-10-CM | POA: Diagnosis not present

## 2019-06-02 DIAGNOSIS — M25572 Pain in left ankle and joints of left foot: Secondary | ICD-10-CM | POA: Diagnosis not present

## 2019-06-09 DIAGNOSIS — L03012 Cellulitis of left finger: Secondary | ICD-10-CM | POA: Diagnosis not present

## 2019-06-09 DIAGNOSIS — M76812 Anterior tibial syndrome, left leg: Secondary | ICD-10-CM | POA: Diagnosis not present

## 2019-06-15 DIAGNOSIS — D0439 Carcinoma in situ of skin of other parts of face: Secondary | ICD-10-CM | POA: Diagnosis not present

## 2019-06-15 DIAGNOSIS — C44329 Squamous cell carcinoma of skin of other parts of face: Secondary | ICD-10-CM | POA: Diagnosis not present

## 2019-08-14 DIAGNOSIS — L814 Other melanin hyperpigmentation: Secondary | ICD-10-CM | POA: Diagnosis not present

## 2019-08-14 DIAGNOSIS — D485 Neoplasm of uncertain behavior of skin: Secondary | ICD-10-CM | POA: Diagnosis not present

## 2019-08-14 DIAGNOSIS — L918 Other hypertrophic disorders of the skin: Secondary | ICD-10-CM | POA: Diagnosis not present

## 2019-08-14 DIAGNOSIS — L821 Other seborrheic keratosis: Secondary | ICD-10-CM | POA: Diagnosis not present

## 2019-08-14 DIAGNOSIS — L820 Inflamed seborrheic keratosis: Secondary | ICD-10-CM | POA: Diagnosis not present

## 2019-08-14 DIAGNOSIS — Z85828 Personal history of other malignant neoplasm of skin: Secondary | ICD-10-CM | POA: Diagnosis not present

## 2019-08-14 DIAGNOSIS — D1801 Hemangioma of skin and subcutaneous tissue: Secondary | ICD-10-CM | POA: Diagnosis not present

## 2019-08-14 DIAGNOSIS — L570 Actinic keratosis: Secondary | ICD-10-CM | POA: Diagnosis not present

## 2019-09-18 DIAGNOSIS — K143 Hypertrophy of tongue papillae: Secondary | ICD-10-CM | POA: Diagnosis not present

## 2019-09-18 DIAGNOSIS — K130 Diseases of lips: Secondary | ICD-10-CM | POA: Diagnosis not present

## 2019-09-22 DIAGNOSIS — Z1231 Encounter for screening mammogram for malignant neoplasm of breast: Secondary | ICD-10-CM | POA: Diagnosis not present

## 2019-10-04 DIAGNOSIS — L821 Other seborrheic keratosis: Secondary | ICD-10-CM | POA: Diagnosis not present

## 2019-10-04 DIAGNOSIS — C44622 Squamous cell carcinoma of skin of right upper limb, including shoulder: Secondary | ICD-10-CM | POA: Diagnosis not present

## 2019-10-04 DIAGNOSIS — L570 Actinic keratosis: Secondary | ICD-10-CM | POA: Diagnosis not present

## 2019-10-04 DIAGNOSIS — L820 Inflamed seborrheic keratosis: Secondary | ICD-10-CM | POA: Diagnosis not present

## 2019-10-04 DIAGNOSIS — Z85828 Personal history of other malignant neoplasm of skin: Secondary | ICD-10-CM | POA: Diagnosis not present

## 2019-10-05 DIAGNOSIS — N6002 Solitary cyst of left breast: Secondary | ICD-10-CM | POA: Diagnosis not present

## 2019-10-05 DIAGNOSIS — R922 Inconclusive mammogram: Secondary | ICD-10-CM | POA: Diagnosis not present

## 2019-10-06 DIAGNOSIS — D649 Anemia, unspecified: Secondary | ICD-10-CM | POA: Diagnosis not present

## 2019-10-06 DIAGNOSIS — Z0000 Encounter for general adult medical examination without abnormal findings: Secondary | ICD-10-CM | POA: Diagnosis not present

## 2019-10-06 DIAGNOSIS — E7800 Pure hypercholesterolemia, unspecified: Secondary | ICD-10-CM | POA: Diagnosis not present

## 2019-10-06 DIAGNOSIS — M859 Disorder of bone density and structure, unspecified: Secondary | ICD-10-CM | POA: Diagnosis not present

## 2019-10-24 DIAGNOSIS — Z86010 Personal history of colonic polyps: Secondary | ICD-10-CM | POA: Diagnosis not present

## 2019-10-24 DIAGNOSIS — E669 Obesity, unspecified: Secondary | ICD-10-CM | POA: Diagnosis not present

## 2019-10-24 DIAGNOSIS — H9190 Unspecified hearing loss, unspecified ear: Secondary | ICD-10-CM | POA: Diagnosis not present

## 2019-10-24 DIAGNOSIS — M859 Disorder of bone density and structure, unspecified: Secondary | ICD-10-CM | POA: Diagnosis not present

## 2019-10-24 DIAGNOSIS — E559 Vitamin D deficiency, unspecified: Secondary | ICD-10-CM | POA: Diagnosis not present

## 2019-10-24 DIAGNOSIS — Z6831 Body mass index (BMI) 31.0-31.9, adult: Secondary | ICD-10-CM | POA: Diagnosis not present

## 2019-10-24 DIAGNOSIS — Z23 Encounter for immunization: Secondary | ICD-10-CM | POA: Diagnosis not present

## 2019-10-24 DIAGNOSIS — Z0000 Encounter for general adult medical examination without abnormal findings: Secondary | ICD-10-CM | POA: Diagnosis not present

## 2019-10-24 DIAGNOSIS — E7800 Pure hypercholesterolemia, unspecified: Secondary | ICD-10-CM | POA: Diagnosis not present

## 2019-11-08 DIAGNOSIS — C44729 Squamous cell carcinoma of skin of left lower limb, including hip: Secondary | ICD-10-CM | POA: Diagnosis not present

## 2019-11-08 DIAGNOSIS — Z23 Encounter for immunization: Secondary | ICD-10-CM | POA: Diagnosis not present

## 2019-11-08 DIAGNOSIS — H906 Mixed conductive and sensorineural hearing loss, bilateral: Secondary | ICD-10-CM | POA: Diagnosis not present

## 2019-11-08 DIAGNOSIS — D0472 Carcinoma in situ of skin of left lower limb, including hip: Secondary | ICD-10-CM | POA: Diagnosis not present

## 2019-11-08 DIAGNOSIS — L570 Actinic keratosis: Secondary | ICD-10-CM | POA: Diagnosis not present

## 2019-11-08 DIAGNOSIS — D485 Neoplasm of uncertain behavior of skin: Secondary | ICD-10-CM | POA: Diagnosis not present

## 2019-11-08 DIAGNOSIS — Z85828 Personal history of other malignant neoplasm of skin: Secondary | ICD-10-CM | POA: Diagnosis not present

## 2019-11-28 DIAGNOSIS — L570 Actinic keratosis: Secondary | ICD-10-CM | POA: Diagnosis not present

## 2019-11-28 DIAGNOSIS — L820 Inflamed seborrheic keratosis: Secondary | ICD-10-CM | POA: Diagnosis not present

## 2019-11-28 DIAGNOSIS — Z85828 Personal history of other malignant neoplasm of skin: Secondary | ICD-10-CM | POA: Diagnosis not present

## 2020-01-15 DIAGNOSIS — M79604 Pain in right leg: Secondary | ICD-10-CM | POA: Diagnosis not present

## 2020-02-12 DIAGNOSIS — Z1159 Encounter for screening for other viral diseases: Secondary | ICD-10-CM | POA: Diagnosis not present

## 2020-02-16 DIAGNOSIS — M1991 Primary osteoarthritis, unspecified site: Secondary | ICD-10-CM | POA: Diagnosis not present

## 2020-02-16 DIAGNOSIS — Z6830 Body mass index (BMI) 30.0-30.9, adult: Secondary | ICD-10-CM | POA: Diagnosis not present

## 2020-02-23 DIAGNOSIS — Z85828 Personal history of other malignant neoplasm of skin: Secondary | ICD-10-CM | POA: Diagnosis not present

## 2020-02-23 DIAGNOSIS — D0471 Carcinoma in situ of skin of right lower limb, including hip: Secondary | ICD-10-CM | POA: Diagnosis not present

## 2020-02-23 DIAGNOSIS — D485 Neoplasm of uncertain behavior of skin: Secondary | ICD-10-CM | POA: Diagnosis not present

## 2020-02-23 DIAGNOSIS — L570 Actinic keratosis: Secondary | ICD-10-CM | POA: Diagnosis not present

## 2020-02-23 DIAGNOSIS — C44529 Squamous cell carcinoma of skin of other part of trunk: Secondary | ICD-10-CM | POA: Diagnosis not present

## 2020-02-23 DIAGNOSIS — D0461 Carcinoma in situ of skin of right upper limb, including shoulder: Secondary | ICD-10-CM | POA: Diagnosis not present

## 2020-02-23 DIAGNOSIS — D225 Melanocytic nevi of trunk: Secondary | ICD-10-CM | POA: Diagnosis not present

## 2020-02-23 DIAGNOSIS — L821 Other seborrheic keratosis: Secondary | ICD-10-CM | POA: Diagnosis not present

## 2020-02-23 DIAGNOSIS — L718 Other rosacea: Secondary | ICD-10-CM | POA: Diagnosis not present

## 2020-02-23 DIAGNOSIS — D1801 Hemangioma of skin and subcutaneous tissue: Secondary | ICD-10-CM | POA: Diagnosis not present

## 2020-03-19 DIAGNOSIS — Z85828 Personal history of other malignant neoplasm of skin: Secondary | ICD-10-CM | POA: Diagnosis not present

## 2020-03-19 DIAGNOSIS — L0101 Non-bullous impetigo: Secondary | ICD-10-CM | POA: Diagnosis not present

## 2020-03-19 DIAGNOSIS — L0109 Other impetigo: Secondary | ICD-10-CM | POA: Diagnosis not present

## 2020-04-16 DIAGNOSIS — L723 Sebaceous cyst: Secondary | ICD-10-CM | POA: Diagnosis not present

## 2020-04-16 DIAGNOSIS — B351 Tinea unguium: Secondary | ICD-10-CM | POA: Diagnosis not present

## 2020-04-16 DIAGNOSIS — Z85828 Personal history of other malignant neoplasm of skin: Secondary | ICD-10-CM | POA: Diagnosis not present

## 2020-04-22 DIAGNOSIS — C44529 Squamous cell carcinoma of skin of other part of trunk: Secondary | ICD-10-CM | POA: Diagnosis not present

## 2020-04-22 DIAGNOSIS — L905 Scar conditions and fibrosis of skin: Secondary | ICD-10-CM | POA: Diagnosis not present

## 2020-05-17 DIAGNOSIS — Z961 Presence of intraocular lens: Secondary | ICD-10-CM | POA: Diagnosis not present

## 2020-05-17 DIAGNOSIS — Z23 Encounter for immunization: Secondary | ICD-10-CM | POA: Diagnosis not present

## 2020-06-04 DIAGNOSIS — D485 Neoplasm of uncertain behavior of skin: Secondary | ICD-10-CM | POA: Diagnosis not present

## 2020-06-04 DIAGNOSIS — Z85828 Personal history of other malignant neoplasm of skin: Secondary | ICD-10-CM | POA: Diagnosis not present

## 2020-06-04 DIAGNOSIS — L570 Actinic keratosis: Secondary | ICD-10-CM | POA: Diagnosis not present

## 2020-06-04 DIAGNOSIS — B351 Tinea unguium: Secondary | ICD-10-CM | POA: Diagnosis not present

## 2020-08-13 DIAGNOSIS — L821 Other seborrheic keratosis: Secondary | ICD-10-CM | POA: Diagnosis not present

## 2020-08-13 DIAGNOSIS — I788 Other diseases of capillaries: Secondary | ICD-10-CM | POA: Diagnosis not present

## 2020-08-13 DIAGNOSIS — Z85828 Personal history of other malignant neoplasm of skin: Secondary | ICD-10-CM | POA: Diagnosis not present

## 2020-08-13 DIAGNOSIS — L578 Other skin changes due to chronic exposure to nonionizing radiation: Secondary | ICD-10-CM | POA: Diagnosis not present

## 2020-08-13 DIAGNOSIS — L570 Actinic keratosis: Secondary | ICD-10-CM | POA: Diagnosis not present

## 2020-08-13 DIAGNOSIS — B351 Tinea unguium: Secondary | ICD-10-CM | POA: Diagnosis not present

## 2020-08-13 DIAGNOSIS — L720 Epidermal cyst: Secondary | ICD-10-CM | POA: Diagnosis not present

## 2020-09-08 DIAGNOSIS — Z20822 Contact with and (suspected) exposure to covid-19: Secondary | ICD-10-CM | POA: Diagnosis not present

## 2020-10-21 DIAGNOSIS — M85851 Other specified disorders of bone density and structure, right thigh: Secondary | ICD-10-CM | POA: Diagnosis not present

## 2020-10-21 DIAGNOSIS — M85852 Other specified disorders of bone density and structure, left thigh: Secondary | ICD-10-CM | POA: Diagnosis not present

## 2020-10-21 DIAGNOSIS — Z1231 Encounter for screening mammogram for malignant neoplasm of breast: Secondary | ICD-10-CM | POA: Diagnosis not present

## 2020-10-21 DIAGNOSIS — Z780 Asymptomatic menopausal state: Secondary | ICD-10-CM | POA: Diagnosis not present

## 2020-10-26 DIAGNOSIS — Z23 Encounter for immunization: Secondary | ICD-10-CM | POA: Diagnosis not present

## 2020-10-30 DIAGNOSIS — R197 Diarrhea, unspecified: Secondary | ICD-10-CM | POA: Diagnosis not present

## 2020-10-30 DIAGNOSIS — E559 Vitamin D deficiency, unspecified: Secondary | ICD-10-CM | POA: Diagnosis not present

## 2020-10-30 DIAGNOSIS — E669 Obesity, unspecified: Secondary | ICD-10-CM | POA: Diagnosis not present

## 2020-10-30 DIAGNOSIS — H9190 Unspecified hearing loss, unspecified ear: Secondary | ICD-10-CM | POA: Diagnosis not present

## 2020-10-30 DIAGNOSIS — M859 Disorder of bone density and structure, unspecified: Secondary | ICD-10-CM | POA: Diagnosis not present

## 2020-10-30 DIAGNOSIS — E7800 Pure hypercholesterolemia, unspecified: Secondary | ICD-10-CM | POA: Diagnosis not present

## 2020-10-30 DIAGNOSIS — Z0000 Encounter for general adult medical examination without abnormal findings: Secondary | ICD-10-CM | POA: Diagnosis not present

## 2020-10-30 DIAGNOSIS — Z86010 Personal history of colonic polyps: Secondary | ICD-10-CM | POA: Diagnosis not present

## 2020-10-30 DIAGNOSIS — R3915 Urgency of urination: Secondary | ICD-10-CM | POA: Diagnosis not present

## 2020-10-30 DIAGNOSIS — Z79899 Other long term (current) drug therapy: Secondary | ICD-10-CM | POA: Diagnosis not present

## 2020-11-24 DIAGNOSIS — Z23 Encounter for immunization: Secondary | ICD-10-CM | POA: Diagnosis not present

## 2020-12-19 ENCOUNTER — Other Ambulatory Visit: Payer: Self-pay | Admitting: Orthopedic Surgery

## 2020-12-19 DIAGNOSIS — M25551 Pain in right hip: Secondary | ICD-10-CM

## 2020-12-20 ENCOUNTER — Ambulatory Visit
Admission: RE | Admit: 2020-12-20 | Discharge: 2020-12-20 | Disposition: A | Discharge status: Home / Self Care | Payer: Medicare Other | Source: Ambulatory Visit | Attending: Orthopedic Surgery | Admitting: Orthopedic Surgery

## 2020-12-20 DIAGNOSIS — M25551 Pain in right hip: Secondary | ICD-10-CM

## 2020-12-20 DIAGNOSIS — M1611 Unilateral primary osteoarthritis, right hip: Secondary | ICD-10-CM | POA: Diagnosis not present

## 2020-12-24 DIAGNOSIS — D485 Neoplasm of uncertain behavior of skin: Secondary | ICD-10-CM | POA: Diagnosis not present

## 2020-12-24 DIAGNOSIS — Z85828 Personal history of other malignant neoplasm of skin: Secondary | ICD-10-CM | POA: Diagnosis not present

## 2020-12-24 DIAGNOSIS — L820 Inflamed seborrheic keratosis: Secondary | ICD-10-CM | POA: Diagnosis not present

## 2020-12-24 DIAGNOSIS — L570 Actinic keratosis: Secondary | ICD-10-CM | POA: Diagnosis not present

## 2020-12-24 DIAGNOSIS — L821 Other seborrheic keratosis: Secondary | ICD-10-CM | POA: Diagnosis not present

## 2021-03-07 DIAGNOSIS — R0981 Nasal congestion: Secondary | ICD-10-CM | POA: Diagnosis not present

## 2021-03-07 DIAGNOSIS — J029 Acute pharyngitis, unspecified: Secondary | ICD-10-CM | POA: Diagnosis not present

## 2021-03-07 DIAGNOSIS — Z20822 Contact with and (suspected) exposure to covid-19: Secondary | ICD-10-CM | POA: Diagnosis not present

## 2021-03-11 DIAGNOSIS — Z961 Presence of intraocular lens: Secondary | ICD-10-CM | POA: Diagnosis not present

## 2021-03-11 DIAGNOSIS — H52203 Unspecified astigmatism, bilateral: Secondary | ICD-10-CM | POA: Diagnosis not present

## 2021-03-17 DIAGNOSIS — R0981 Nasal congestion: Secondary | ICD-10-CM | POA: Diagnosis not present

## 2021-03-17 DIAGNOSIS — R051 Acute cough: Secondary | ICD-10-CM | POA: Diagnosis not present

## 2021-03-26 DIAGNOSIS — R051 Acute cough: Secondary | ICD-10-CM | POA: Diagnosis not present

## 2021-03-27 DIAGNOSIS — U099 Post covid-19 condition, unspecified: Secondary | ICD-10-CM | POA: Diagnosis not present

## 2021-03-27 DIAGNOSIS — H906 Mixed conductive and sensorineural hearing loss, bilateral: Secondary | ICD-10-CM | POA: Diagnosis not present

## 2021-04-04 DIAGNOSIS — I517 Cardiomegaly: Secondary | ICD-10-CM | POA: Diagnosis not present

## 2021-04-04 DIAGNOSIS — R059 Cough, unspecified: Secondary | ICD-10-CM | POA: Diagnosis not present

## 2021-04-07 DIAGNOSIS — K449 Diaphragmatic hernia without obstruction or gangrene: Secondary | ICD-10-CM | POA: Diagnosis not present

## 2021-04-07 DIAGNOSIS — I270 Primary pulmonary hypertension: Secondary | ICD-10-CM | POA: Diagnosis not present

## 2021-04-07 DIAGNOSIS — Z20822 Contact with and (suspected) exposure to covid-19: Secondary | ICD-10-CM | POA: Diagnosis not present

## 2021-04-07 DIAGNOSIS — I071 Rheumatic tricuspid insufficiency: Secondary | ICD-10-CM | POA: Diagnosis not present

## 2021-04-07 DIAGNOSIS — I340 Nonrheumatic mitral (valve) insufficiency: Secondary | ICD-10-CM | POA: Diagnosis not present

## 2021-04-07 DIAGNOSIS — I77810 Thoracic aortic ectasia: Secondary | ICD-10-CM | POA: Diagnosis not present

## 2021-04-07 DIAGNOSIS — R059 Cough, unspecified: Secondary | ICD-10-CM | POA: Diagnosis not present

## 2021-04-07 DIAGNOSIS — I517 Cardiomegaly: Secondary | ICD-10-CM | POA: Diagnosis not present

## 2021-04-07 DIAGNOSIS — K769 Liver disease, unspecified: Secondary | ICD-10-CM | POA: Diagnosis not present

## 2021-04-08 ENCOUNTER — Ambulatory Visit: Payer: Medicare Other | Admitting: Cardiology

## 2021-04-08 ENCOUNTER — Encounter: Payer: Self-pay | Admitting: Cardiology

## 2021-04-08 ENCOUNTER — Other Ambulatory Visit: Payer: Self-pay

## 2021-04-08 ENCOUNTER — Inpatient Hospital Stay: Payer: Medicare Other

## 2021-04-08 VITALS — BP 129/76 | HR 84 | Temp 98.4°F | Resp 17 | Ht 64.5 in | Wt 177.6 lb

## 2021-04-08 DIAGNOSIS — I517 Cardiomegaly: Secondary | ICD-10-CM | POA: Diagnosis not present

## 2021-04-08 DIAGNOSIS — I77810 Thoracic aortic ectasia: Secondary | ICD-10-CM | POA: Diagnosis not present

## 2021-04-08 DIAGNOSIS — R931 Abnormal findings on diagnostic imaging of heart and coronary circulation: Secondary | ICD-10-CM | POA: Diagnosis not present

## 2021-04-08 DIAGNOSIS — I071 Rheumatic tricuspid insufficiency: Secondary | ICD-10-CM | POA: Diagnosis not present

## 2021-04-08 NOTE — Progress Notes (Signed)
? ? ?Patient referred by Kathyrn Lass, MD for Ascending aorta dilatation, severe LA enlargement ? ?Subjective:  ? ?[name], female    DOB: April 30, 1945, 76 y.o.   MRN: [id number] ? ? ?Chief Complaint  ?Patient presents with  ? New Patient (Initial Visit)  ? ascending dilatation  ? Tricuspid Regurgitation  ? Mitral Regurgitation  ? atrial dilatation  ? pulmonary htn  ? ? ? ?HPI ? ?76 year old Caucasian female with uncontrolled hyperlipidemia, chronic cough, no tactile hernia, referred for evaluation of echocardiogram abnormalities. ? ?Patient is here with her husband.  She is very pleasant lady, who is fairly active with regular walking etc.  Ever since she had COVID few months ago, she is experienced chronic cough and congestion in the sinuses to the point that her voice is changed.  On work-up for chronic cough, she was found to have a large hiatal hernia.  She was scheduled to undergo colonoscopy as well as EGD with gastroenterology.  However, screening echocardiogram showed certain abnormalities, including severe left atrial enlargement, mild dilation of ascending aorta 38 mm, mildly elevated PASP 39 mmHg.  ? ?Patient denies any chest pain or shortness of breath at baseline.  She also denies any orthopnea, PND, leg edema symptoms.  She has no known history of atrial fibrillation, denies any palpitation symptoms. ? ? ?Past Medical History:  ?Diagnosis Date  ? Compression fracture   ? lumbar   ? Sensory - neural hearing loss   ? right  ? ? ? ?Past Surgical History:  ?Procedure Laterality Date  ? BONE ANCHORED HEARING AID IMPLANT Right 10/10/2014  ? Procedure: BONE ANCHORED HEARING AID (BAHA) IMPLANT TEMPORAL BONE RIGHT EAR;  Surgeon: Vicie Mutters, MD;  Location: [facility];  Service: ENT;  Laterality: Right;  ? BREAST SURGERY    ? breast reduction  ? CARPAL TUNNEL RELEASE Right   ? HIATAL HERNIA REPAIR    ? ROTATOR CUFF REPAIR Right   ? STAPEDECTOMY Bilateral   ? ? ? ?Social History  ? ?Tobacco Use   ?Smoking Status Never  ?Smokeless Tobacco Never  ? ? ?Social History  ? ?Substance and Sexual Activity  ?Alcohol Use Yes  ? Comment: social  ? ? ? ?Family History  ?Problem Relation Age of Onset  ? Cancer Sister   ? ? ? ? ?Current Outpatient Medications:  ?  acetaminophen (TYLENOL) 325 MG tablet, Take 650 mg by mouth every 6 (six) hours as needed., Disp: , Rfl:  ?  calcium carbonate (OS-CAL) 1250 (500 Ca) MG chewable tablet, Chew 1 capsule by mouth daily., Disp: , Rfl:  ?  cetirizine (ZYRTEC) 10 MG tablet, Take 1 tablet by mouth as needed., Disp: , Rfl:  ?  Cholecalciferol 125 MCG (5000 UT) capsule, Take 1 capsule by mouth daily., Disp: , Rfl:  ?  Collagenase POWD, Take 1 Scoop by mouth daily., Disp: , Rfl:  ?  dextromethorphan (DELSYM) 30 MG/5ML liquid, Take by mouth every 12 (twelve) hours., Disp: , Rfl:  ?  FOSAMAX 70 MG tablet, Take 1 tablet by mouth once a week., Disp: , Rfl:  ?  Magnesium 250 MG TABS, Take 1 tablet by mouth daily., Disp: , Rfl:  ?  Multiple Vitamin (MULTI-VITAMIN DAILY PO), Take 1 tablet by mouth daily., Disp: , Rfl:  ?  Probiotic Product (MISC INTESTINAL FLORA REGULAT) CAPS, Take 1 capsule by mouth daily., Disp: , Rfl:  ?  rosuvastatin (CRESTOR) 10 MG tablet, Take 10 mg by mouth at bedtime.,  Disp: , Rfl:  ?  tretinoin (RETIN-A) 0.05 % cream, Apply 1 application. topically as needed., Disp: , Rfl:  ?  vitamin E 45 MG (100 UNITS) capsule, Take 1 capsule by mouth daily., Disp: , Rfl:  ? ? ?Cardiovascular and other pertinent studies: ? ?Reviewed external labs and tests, independently interpreted ? ?EKG 04/08/2021: ?Sinus rhythm 72 bpm ?Short PR interval ?Old anteroseptal infarct ?Nonspecific ST depression ? ?Echocardiogram 04/08/2021: ?Normal LV systolic function with visual EF 60-65%. Left ventricle cavity is normal in size. Normal left ventricular wall thickness. Normal global wall motion. Indeterminate diastolic filling pattern, normal LAP.  ?Left atrial cavity is severely dilated. ?Mild (Grade  I) mitral regurgitation. ?Mild tricuspid regurgitation. Mild pulmonary hypertension. RVSP measures 39 mmHg. ?The aortic root is normal. Mildly dilated proximal ascending aorta 64m. ?Echogenic structure within the liver parenchyma measure  5.7 x 4.54cm. Recommend ultrasound  of the abdomen for further evaluation. ? ? ?Recent labs: ?10/30/2020: ?Glucose 104, BUN/Cr 17/0.78. EGFR 78. Na/K 14/4.5. Rest of the CMP normal ?H/H 14/40. MCV 93. Platelets 240 ?HbA1C NA ?Chol 155, TG 142, HDL 61, LDL 70 ?TSH 1.4 normal ? ? ? ?Review of Systems  ?HENT:  Positive for congestion.   ?Cardiovascular:  Negative for chest pain, dyspnea on exertion, leg swelling, palpitations and syncope.  ?Respiratory:  Positive for cough.   ? ?   ? ? ?Vitals:  ? 04/08/21 1035  ?BP: 129/76  ?Pulse: 84  ?Resp: 17  ?Temp: 98.4 ?F (36.9 ?C)  ?SpO2: 94%  ? ? ? ?Body mass index is 30.01 kg/m?. ?Danley DankerWeights  ? 04/08/21 1035  ?Weight: 177 lb 9.6 oz (80.6 kg)  ? ? ? ?Objective:  ? Physical Exam ?Vitals and nursing note reviewed.  ?Constitutional:   ?   General: She is not in acute distress. ?Neck:  ?   Vascular: No JVD.  ?Cardiovascular:  ?   Rate and Rhythm: Normal rate and regular rhythm.  ?   Heart sounds: Normal heart sounds. No murmur heard. ?Pulmonary:  ?   Effort: Pulmonary effort is normal.  ?   Breath sounds: Normal breath sounds. No wheezing or rales.  ?Musculoskeletal:  ?   Right lower leg: No edema.  ?   Left lower leg: No edema.  ? ? ? ?   ? ?Visit diagnoses: ?  ICD-10-CM   ?1. Ascending aorta dilatation (HCC)  I77.810 EKG 12-Lead  ?  ?2. Abnormal echocardiogram  R93.1 LONG TERM MONITOR (3-14 DAYS)  ?  PCV ECHOCARDIOGRAM COMPLETE  ?  ?3. Left atrial enlargement  I51.7   ?  ?4. Mild tricuspid regurgitation  I07.1   ?  ?  ? ?Orders Placed This Encounter  ?Procedures  ? EKG 12-Lead  ?  ? ? ?Assessment & Recommendations:  ? ?76 year old Caucasian female with uncontrolled hyperlipidemia, chronic cough, large hiatal hernia, referred for evaluation  of echocardiogram abnormalities. ? ?Echocardiogram shows mild MR, mild TR, mildly elevated RVSP, and severe left atrial enlargement.  Patient has no symptoms of dyspnea on exertion.  I am unable to explain her severe left atrial enlargement in absence of severe mitral stenosis or regurgitation.  There is no clear history suggestive of atrial fibrillation.  Nonetheless, I recommend 2-week cardiac telemetry as screening to evaluate for asymptomatic atrial fibrillation. ? ?I do not think any of the echocardiogram abnormalities preclude her from undergoing EGD and colonoscopy in the near future.  I strongly suspect her chronic cough could be due to GERD in the setting  of large hiatal hernia.  Avoid foods that can trigger GERD, such as tomato, citrus foods, chocolate, caffeine.  Defer further work-up and management of possible GERD to gastroenterology, since she has upcoming procedures with them. ? ?I will repeat echocardiogram in 6 months to reevaluate her above echocardiogram abnormalities. ? ?Further recommendations after above testing. ? ?Thank you for referring the patient to [place]. Please feel free to contact with any questions. ? ? ?Nigel Mormon, MD ?Pager: [phone] ?Office: [phone] ?

## 2021-04-09 ENCOUNTER — Encounter: Payer: Self-pay | Admitting: Cardiology

## 2021-04-09 DIAGNOSIS — I77810 Thoracic aortic ectasia: Secondary | ICD-10-CM | POA: Insufficient documentation

## 2021-04-09 DIAGNOSIS — R931 Abnormal findings on diagnostic imaging of heart and coronary circulation: Secondary | ICD-10-CM | POA: Insufficient documentation

## 2021-04-09 DIAGNOSIS — I517 Cardiomegaly: Secondary | ICD-10-CM | POA: Insufficient documentation

## 2021-04-09 DIAGNOSIS — I071 Rheumatic tricuspid insufficiency: Secondary | ICD-10-CM | POA: Insufficient documentation

## 2021-04-11 DIAGNOSIS — R059 Cough, unspecified: Secondary | ICD-10-CM | POA: Diagnosis not present

## 2021-04-11 DIAGNOSIS — K7689 Other specified diseases of liver: Secondary | ICD-10-CM | POA: Diagnosis not present

## 2021-04-11 DIAGNOSIS — K449 Diaphragmatic hernia without obstruction or gangrene: Secondary | ICD-10-CM | POA: Diagnosis not present

## 2021-04-11 DIAGNOSIS — K769 Liver disease, unspecified: Secondary | ICD-10-CM | POA: Diagnosis not present

## 2021-04-14 DIAGNOSIS — L570 Actinic keratosis: Secondary | ICD-10-CM | POA: Diagnosis not present

## 2021-04-14 DIAGNOSIS — D485 Neoplasm of uncertain behavior of skin: Secondary | ICD-10-CM | POA: Diagnosis not present

## 2021-04-14 DIAGNOSIS — Z85828 Personal history of other malignant neoplasm of skin: Secondary | ICD-10-CM | POA: Diagnosis not present

## 2021-04-25 DIAGNOSIS — R931 Abnormal findings on diagnostic imaging of heart and coronary circulation: Secondary | ICD-10-CM | POA: Diagnosis not present

## 2021-05-01 DIAGNOSIS — Z86010 Personal history of colonic polyps: Secondary | ICD-10-CM | POA: Diagnosis not present

## 2021-05-01 DIAGNOSIS — K219 Gastro-esophageal reflux disease without esophagitis: Secondary | ICD-10-CM | POA: Diagnosis not present

## 2021-05-01 DIAGNOSIS — D122 Benign neoplasm of ascending colon: Secondary | ICD-10-CM | POA: Diagnosis not present

## 2021-05-01 DIAGNOSIS — Z1211 Encounter for screening for malignant neoplasm of colon: Secondary | ICD-10-CM | POA: Diagnosis not present

## 2021-05-01 DIAGNOSIS — K5730 Diverticulosis of large intestine without perforation or abscess without bleeding: Secondary | ICD-10-CM | POA: Diagnosis not present

## 2021-05-01 DIAGNOSIS — K635 Polyp of colon: Secondary | ICD-10-CM | POA: Diagnosis not present

## 2021-05-01 DIAGNOSIS — K449 Diaphragmatic hernia without obstruction or gangrene: Secondary | ICD-10-CM | POA: Diagnosis not present

## 2021-05-07 DIAGNOSIS — D485 Neoplasm of uncertain behavior of skin: Secondary | ICD-10-CM | POA: Diagnosis not present

## 2021-05-07 DIAGNOSIS — L718 Other rosacea: Secondary | ICD-10-CM | POA: Diagnosis not present

## 2021-05-07 DIAGNOSIS — Z85828 Personal history of other malignant neoplasm of skin: Secondary | ICD-10-CM | POA: Diagnosis not present

## 2021-05-07 DIAGNOSIS — L570 Actinic keratosis: Secondary | ICD-10-CM | POA: Diagnosis not present

## 2021-05-08 ENCOUNTER — Ambulatory Visit: Payer: Medicare Other | Admitting: Podiatry

## 2021-05-08 DIAGNOSIS — Z808 Family history of malignant neoplasm of other organs or systems: Secondary | ICD-10-CM | POA: Insufficient documentation

## 2021-05-08 DIAGNOSIS — J384 Edema of larynx: Secondary | ICD-10-CM | POA: Diagnosis not present

## 2021-05-08 DIAGNOSIS — R053 Chronic cough: Secondary | ICD-10-CM | POA: Insufficient documentation

## 2021-05-08 DIAGNOSIS — Z8616 Personal history of COVID-19: Secondary | ICD-10-CM | POA: Diagnosis not present

## 2021-05-09 DIAGNOSIS — K219 Gastro-esophageal reflux disease without esophagitis: Secondary | ICD-10-CM | POA: Insufficient documentation

## 2021-05-12 ENCOUNTER — Other Ambulatory Visit: Payer: Self-pay | Admitting: Physician Assistant

## 2021-05-12 DIAGNOSIS — R931 Abnormal findings on diagnostic imaging of heart and coronary circulation: Secondary | ICD-10-CM | POA: Diagnosis not present

## 2021-05-12 DIAGNOSIS — Z808 Family history of malignant neoplasm of other organs or systems: Secondary | ICD-10-CM

## 2021-05-12 NOTE — Progress Notes (Signed)
Please forward monitor results and my note to PCP office. ? ?Thanks ?MJP ? ?

## 2021-05-12 NOTE — Progress Notes (Signed)
Ok done

## 2021-05-20 ENCOUNTER — Encounter: Payer: Self-pay | Admitting: Podiatry

## 2021-05-20 ENCOUNTER — Ambulatory Visit (INDEPENDENT_AMBULATORY_CARE_PROVIDER_SITE_OTHER): Payer: Medicare Other

## 2021-05-20 ENCOUNTER — Ambulatory Visit (INDEPENDENT_AMBULATORY_CARE_PROVIDER_SITE_OTHER): Payer: Medicare Other | Admitting: Podiatry

## 2021-05-20 DIAGNOSIS — D509 Iron deficiency anemia, unspecified: Secondary | ICD-10-CM | POA: Insufficient documentation

## 2021-05-20 DIAGNOSIS — Z860100 Personal history of colon polyps, unspecified: Secondary | ICD-10-CM | POA: Insufficient documentation

## 2021-05-20 DIAGNOSIS — E7800 Pure hypercholesterolemia, unspecified: Secondary | ICD-10-CM | POA: Insufficient documentation

## 2021-05-20 DIAGNOSIS — H9190 Unspecified hearing loss, unspecified ear: Secondary | ICD-10-CM | POA: Insufficient documentation

## 2021-05-20 DIAGNOSIS — M25471 Effusion, right ankle: Secondary | ICD-10-CM | POA: Diagnosis not present

## 2021-05-20 DIAGNOSIS — M7752 Other enthesopathy of left foot: Secondary | ICD-10-CM

## 2021-05-20 DIAGNOSIS — M7750 Other enthesopathy of unspecified foot: Secondary | ICD-10-CM | POA: Diagnosis not present

## 2021-05-20 DIAGNOSIS — I2729 Other secondary pulmonary hypertension: Secondary | ICD-10-CM | POA: Insufficient documentation

## 2021-05-20 DIAGNOSIS — R0989 Other specified symptoms and signs involving the circulatory and respiratory systems: Secondary | ICD-10-CM | POA: Diagnosis not present

## 2021-05-20 DIAGNOSIS — E669 Obesity, unspecified: Secondary | ICD-10-CM | POA: Insufficient documentation

## 2021-05-20 DIAGNOSIS — M25472 Effusion, left ankle: Secondary | ICD-10-CM

## 2021-05-20 DIAGNOSIS — I517 Cardiomegaly: Secondary | ICD-10-CM | POA: Insufficient documentation

## 2021-05-20 DIAGNOSIS — I272 Pulmonary hypertension, unspecified: Secondary | ICD-10-CM | POA: Insufficient documentation

## 2021-05-20 DIAGNOSIS — K5900 Constipation, unspecified: Secondary | ICD-10-CM | POA: Insufficient documentation

## 2021-05-20 DIAGNOSIS — K769 Liver disease, unspecified: Secondary | ICD-10-CM | POA: Insufficient documentation

## 2021-05-20 DIAGNOSIS — M25475 Effusion, left foot: Secondary | ICD-10-CM | POA: Diagnosis not present

## 2021-05-20 DIAGNOSIS — S90121A Contusion of right lesser toe(s) without damage to nail, initial encounter: Secondary | ICD-10-CM

## 2021-05-20 DIAGNOSIS — M9390 Osteochondropathy, unspecified of unspecified site: Secondary | ICD-10-CM | POA: Insufficient documentation

## 2021-05-20 DIAGNOSIS — E559 Vitamin D deficiency, unspecified: Secondary | ICD-10-CM | POA: Insufficient documentation

## 2021-05-20 DIAGNOSIS — M25474 Effusion, right foot: Secondary | ICD-10-CM | POA: Diagnosis not present

## 2021-05-20 DIAGNOSIS — M1991 Primary osteoarthritis, unspecified site: Secondary | ICD-10-CM | POA: Insufficient documentation

## 2021-05-20 DIAGNOSIS — K449 Diaphragmatic hernia without obstruction or gangrene: Secondary | ICD-10-CM | POA: Insufficient documentation

## 2021-05-20 NOTE — Progress Notes (Signed)
?Subjective:  ?Patient ID: [name], female    DOB: 1946-01-20,  MRN: [id number] ?HPI ?Chief Complaint  ?Patient presents with  ? Toe Pain  ?  Toes bilateral (L>R) - concerned about sudden redness x 1 year, had tendon tear in left years ago, weakness, twinges intermittently, but that also occurs in both feet  ? Toe Injury  ?  5th toe right - bumped toe x 1 week ago, kept toe taped to 4th toe, tender  ? Nail Problem  ?  Toenails - concerned about fungus, took oral med, has tried a topical cream and currently still uses BID  ? New Patient (Initial Visit)  ? ? ?76 y.o. female presents with the above complaint.  ? ?ROS: Denies fever chills nausea vomit muscle aches pains calf pain back pain chest pain shortness of breath. ? ?Past Medical History:  ?Diagnosis Date  ? Compression fracture   ? lumbar   ? Sensory - neural hearing loss   ? right  ? ?Past Surgical History:  ?Procedure Laterality Date  ? BONE ANCHORED HEARING AID IMPLANT Right 10/10/2014  ? Procedure: BONE ANCHORED HEARING AID (BAHA) IMPLANT TEMPORAL BONE RIGHT EAR;  Surgeon: Vicie Mutters, MD;  Location: [facility];  Service: ENT;  Laterality: Right;  ? BREAST SURGERY    ? breast reduction  ? CARPAL TUNNEL RELEASE Right   ? HIATAL HERNIA REPAIR    ? ROTATOR CUFF REPAIR Right   ? STAPEDECTOMY Bilateral   ? ? ?Current Outpatient Medications:  ?  acetaminophen (TYLENOL) 325 MG tablet, Take 650 mg by mouth every 6 (six) hours as needed., Disp: , Rfl:  ?  calcium carbonate (OS-CAL) 1250 (500 Ca) MG chewable tablet, Chew 1 capsule by mouth daily., Disp: , Rfl:  ?  cetirizine (ZYRTEC) 10 MG tablet, Take 1 tablet by mouth as needed., Disp: , Rfl:  ?  Cholecalciferol 125 MCG (5000 UT) capsule, Take 1 capsule by mouth daily., Disp: , Rfl:  ?  Collagenase POWD, Take 1 Scoop by mouth daily., Disp: , Rfl:  ?  dextromethorphan (DELSYM) 30 MG/5ML liquid, Take by mouth every 12 (twelve) hours., Disp: , Rfl:  ?  FOSAMAX 70 MG tablet, Take 1 tablet by mouth  once a week., Disp: , Rfl:  ?  Magnesium 250 MG TABS, Take 1 tablet by mouth daily., Disp: , Rfl:  ?  Multiple Vitamin (MULTI-VITAMIN DAILY PO), Take 1 tablet by mouth daily., Disp: , Rfl:  ?  Probiotic Product (MISC INTESTINAL FLORA REGULAT) CAPS, Take 1 capsule by mouth daily., Disp: , Rfl:  ?  rosuvastatin (CRESTOR) 10 MG tablet, Take 10 mg by mouth at bedtime., Disp: , Rfl:  ?  tretinoin (RETIN-A) 0.05 % cream, Apply 1 application. topically as needed., Disp: , Rfl:  ?  vitamin E 45 MG (100 UNITS) capsule, Take 1 capsule by mouth daily., Disp: , Rfl:  ? ?No Known Allergies ?Review of Systems ?Objective:  ?There were no vitals filed for this visit. ? ?General: Well developed, nourished, in no acute distress, alert and oriented x3  ? ?Dermatological: Skin is warm, dry and supple bilateral. Nails x 10 are well maintained; remaining integument appears unremarkable at this time. There are no open sores, no preulcerative lesions, no rash or signs of infection present. ? ?Vascular: Dorsalis Pedis artery and Posterior Tibial artery pedal pulses are 1/4 bilateral with immedate capillary fill time except when on elevation..  No pedal hair growth present. No varicosities and no lower  extremity edema present bilateral.  With dependency toes are extremely erythematous to cyanotic. ? ?Neruologic: Grossly intact via light touch bilateral. Vibratory intact via tuning fork bilateral. Protective threshold with Semmes Wienstein monofilament intact to all pedal sites bilateral. Patellar and Achilles deep tendon reflexes 2+ bilateral. No Babinski or clonus noted bilateral.  ? ?Musculoskeletal: No gross boney pedal deformities bilateral. No pain, crepitus, or limitation noted with foot and ankle range of motion bilateral. Muscular strength 5/5 in all groups tested bilateral.  Mild swelling and tenderness fifth digit right foot.  Fracture fifth digit. ? ?Gait: Unassisted, Nonantalgic.  ? ? ?Radiographs: ? ?Radiographs demonstrates no  significant osseous abnormalities no acute findings other than a fracture of the proximal phalanx head right foot.  Nondisplaced noncomminuted. ? ?Assessment & Plan:  ? ?Assessment: Vasospastic disorder cannot rule out arterial and venous insufficiencies. ? ?Plan: Requesting ABIs and venous studies I will follow-up with her once those come in. ? ? ? ? ?Elasia Furnish T. [place], DPM ?

## 2021-05-21 ENCOUNTER — Other Ambulatory Visit: Payer: Medicare Other

## 2021-05-21 ENCOUNTER — Ambulatory Visit
Admission: RE | Admit: 2021-05-21 | Discharge: 2021-05-21 | Disposition: A | Discharge status: Home / Self Care | Payer: Medicare Other | Source: Ambulatory Visit | Attending: Physician Assistant | Admitting: Physician Assistant

## 2021-05-21 DIAGNOSIS — Z808 Family history of malignant neoplasm of other organs or systems: Secondary | ICD-10-CM

## 2021-05-21 DIAGNOSIS — E042 Nontoxic multinodular goiter: Secondary | ICD-10-CM | POA: Diagnosis not present

## 2021-05-21 DIAGNOSIS — R490 Dysphonia: Secondary | ICD-10-CM | POA: Diagnosis not present

## 2021-05-23 ENCOUNTER — Ambulatory Visit (INDEPENDENT_AMBULATORY_CARE_PROVIDER_SITE_OTHER)
Admission: RE | Admit: 2021-05-23 | Discharge: 2021-05-23 | Disposition: A | Discharge status: Home / Self Care | Payer: Medicare Other | Source: Ambulatory Visit | Attending: Podiatry | Admitting: Podiatry

## 2021-05-23 ENCOUNTER — Ambulatory Visit (HOSPITAL_COMMUNITY)
Admission: RE | Admit: 2021-05-23 | Discharge: 2021-05-23 | Disposition: A | Discharge status: Home / Self Care | Payer: Medicare Other | Source: Ambulatory Visit | Attending: Podiatry | Admitting: Podiatry

## 2021-05-23 DIAGNOSIS — M25471 Effusion, right ankle: Secondary | ICD-10-CM | POA: Insufficient documentation

## 2021-05-23 DIAGNOSIS — M25475 Effusion, left foot: Secondary | ICD-10-CM | POA: Insufficient documentation

## 2021-05-23 DIAGNOSIS — M25472 Effusion, left ankle: Secondary | ICD-10-CM | POA: Diagnosis not present

## 2021-05-23 DIAGNOSIS — M25474 Effusion, right foot: Secondary | ICD-10-CM

## 2021-05-23 DIAGNOSIS — R0989 Other specified symptoms and signs involving the circulatory and respiratory systems: Secondary | ICD-10-CM | POA: Diagnosis not present

## 2021-05-27 DIAGNOSIS — K449 Diaphragmatic hernia without obstruction or gangrene: Secondary | ICD-10-CM | POA: Diagnosis not present

## 2021-05-27 DIAGNOSIS — R053 Chronic cough: Secondary | ICD-10-CM | POA: Diagnosis not present

## 2021-05-27 DIAGNOSIS — I517 Cardiomegaly: Secondary | ICD-10-CM | POA: Diagnosis not present

## 2021-05-27 DIAGNOSIS — K760 Fatty (change of) liver, not elsewhere classified: Secondary | ICD-10-CM | POA: Diagnosis not present

## 2021-05-27 DIAGNOSIS — I77810 Thoracic aortic ectasia: Secondary | ICD-10-CM | POA: Diagnosis not present

## 2021-05-27 DIAGNOSIS — E042 Nontoxic multinodular goiter: Secondary | ICD-10-CM | POA: Diagnosis not present

## 2021-05-28 DIAGNOSIS — L570 Actinic keratosis: Secondary | ICD-10-CM | POA: Diagnosis not present

## 2021-05-28 DIAGNOSIS — L821 Other seborrheic keratosis: Secondary | ICD-10-CM | POA: Diagnosis not present

## 2021-05-28 DIAGNOSIS — L738 Other specified follicular disorders: Secondary | ICD-10-CM | POA: Diagnosis not present

## 2021-05-28 DIAGNOSIS — Z85828 Personal history of other malignant neoplasm of skin: Secondary | ICD-10-CM | POA: Diagnosis not present

## 2021-05-28 DIAGNOSIS — L718 Other rosacea: Secondary | ICD-10-CM | POA: Diagnosis not present

## 2021-05-30 ENCOUNTER — Telehealth: Payer: Self-pay | Admitting: *Deleted

## 2021-05-30 NOTE — Telephone Encounter (Signed)
-----   Message from Garrel Ridgel, [place] sent at 05/26/2021  5:41 PM EDT ----- ?Arteries look good but does have some venous reflux.   ?

## 2021-05-30 NOTE — Telephone Encounter (Signed)
Patient is seeing Dr. Donnetta Hutching for further treatment. ?

## 2021-06-04 ENCOUNTER — Encounter: Payer: Self-pay | Admitting: Vascular Surgery

## 2021-06-04 ENCOUNTER — Ambulatory Visit (INDEPENDENT_AMBULATORY_CARE_PROVIDER_SITE_OTHER): Payer: Medicare Other | Admitting: Vascular Surgery

## 2021-06-04 VITALS — BP 128/73 | HR 73 | Temp 97.3°F | Resp 181 | Ht 63.5 in | Wt 177.3 lb

## 2021-06-04 DIAGNOSIS — I872 Venous insufficiency (chronic) (peripheral): Secondary | ICD-10-CM | POA: Diagnosis not present

## 2021-06-04 DIAGNOSIS — I8393 Asymptomatic varicose veins of bilateral lower extremities: Secondary | ICD-10-CM

## 2021-06-04 NOTE — Progress Notes (Signed)
? ?ASSESSMENT & PLAN  ? ?CHRONIC VENOUS INSUFFICIENCY: This patient has evidence of chronic venous insufficiency with superficial venous reflux on the right and some deep and superficial venous reflux on the left.  We have discussed the importance of intermittent leg elevation and the proper positioning for this.  We are also having her fitted for some knee-high compression stockings.  She would like to try both the 15 to 20 mmHg stocking and the 20 to 30 mmHg stocking to see which she likes best.  I have encouraged her to avoid prolonged sitting and standing.  We discussed the importance of exercise specifically walking and water aerobics.  We also discussed the importance of maintaining a healthy weight.  If her symptoms progress then certainly I think she could be considered for laser ablation of the left great saphenous vein.  However currently I am not convinced that addressing this would significantly affect her swelling or symptoms.  If she does feel that her symptoms have progressed in the future then we will try a thigh-high stocking with a gradient of 20 to 30 mmHg.  She will call if her symptoms progress.  I reassured her that she has excellent arterial flow. ? ?REASON FOR CONSULT:   ? ?Varicose veins of the left lower extremity with pain.  The consult is requested by Dr. Donnetta Hutching. ? ?HPI:  ? ?[name] is a 76 y.o. female who was referred with chronic venous insufficiency.  She has had pain and swelling in both lower extremities but has been more significant on the left side.  She is also noted some discoloration of her toes and was concerned that she may have poor circulation.  The discoloration of the toes did not occur suddenly but she noticed this gradually.  She has had noninvasive arterial studies which showed no evidence of significant arterial insufficiency. ? ?She denies any previous history of DVT.  She is had no previous venous procedures.  Her main complaint is swelling.  She does  occasionally get some mild heaviness in her legs with prolonged standing but really minimal pain associated with sitting and standing. ? ?I do not get any history of claudication, rest pain, or nonhealing ulcers. ? ?Past Medical History:  ?Diagnosis Date  ? Compression fracture   ? lumbar   ? Sensory - neural hearing loss   ? right  ? ? ?Family History  ?Problem Relation Age of Onset  ? Cancer Sister   ? ? ?SOCIAL HISTORY: ?Social History  ? ?Tobacco Use  ? Smoking status: Never  ? Smokeless tobacco: Never  ?Substance Use Topics  ? Alcohol use: Yes  ?  Comment: social  ? ? ?No Known Allergies ? ?Current Outpatient Medications  ?Medication Sig Dispense Refill  ? acetaminophen (TYLENOL) 325 MG tablet Take 650 mg by mouth every 6 (six) hours as needed.    ? calcium carbonate (OS-CAL) 1250 (500 Ca) MG chewable tablet Chew 1 capsule by mouth daily.    ? cetirizine (ZYRTEC) 10 MG tablet Take 1 tablet by mouth as needed.    ? Cholecalciferol 125 MCG (5000 UT) capsule Take 1 capsule by mouth daily.    ? Collagenase POWD Take 1 Scoop by mouth daily.    ? dextromethorphan (DELSYM) 30 MG/5ML liquid Take by mouth every 12 (twelve) hours.    ? FOSAMAX 70 MG tablet Take 1 tablet by mouth once a week.    ? Magnesium 250 MG TABS Take 1 tablet by mouth daily.    ?  Multiple Vitamin (MULTI-VITAMIN DAILY PO) Take 1 tablet by mouth daily.    ? omeprazole (PRILOSEC) 40 MG capsule 40 mg 2 (two) times daily.    ? Probiotic Product (MISC INTESTINAL FLORA REGULAT) CAPS Take 1 capsule by mouth daily.    ? rosuvastatin (CRESTOR) 10 MG tablet Take 10 mg by mouth at bedtime.    ? tretinoin (RETIN-A) 0.05 % cream Apply 1 application. topically as needed.    ? vitamin E 45 MG (100 UNITS) capsule Take 1 capsule by mouth daily.    ? ?No current facility-administered medications for this visit.  ? ? ?REVIEW OF SYSTEMS:  ?'[X]'$  denotes positive finding, '[ ]'$  denotes negative finding ?Cardiac  Comments:  ?Chest pain or chest pressure:    ?Shortness of  breath upon exertion:    ?Short of breath when lying flat:    ?Irregular heart rhythm:    ?    ?Vascular    ?Pain in calf, thigh, or hip brought on by ambulation:    ?Pain in feet at night that wakes you up from your sleep:     ?Blood clot in your veins:    ?Leg swelling:  x   ?    ?Pulmonary    ?Oxygen at home:    ?Productive cough:     ?Wheezing:     ?    ?Neurologic    ?Sudden weakness in arms or legs:     ?Sudden numbness in arms or legs:     ?Sudden onset of difficulty speaking or slurred speech:    ?Temporary loss of vision in one eye:     ?Problems with dizziness:     ?    ?Gastrointestinal    ?Blood in stool:     ?Vomited blood:     ?    ?Genitourinary    ?Burning when urinating:     ?Blood in urine:    ?    ?Psychiatric    ?Major depression:     ?    ?Hematologic    ?Bleeding problems:    ?Problems with blood clotting too easily:    ?    ?Skin    ?Rashes or ulcers:    ?    ?Constitutional    ?Fever or chills:    ?- ? ?PHYSICAL EXAM:  ? ?Vitals:  ? 06/04/21 1342  ?BP: 128/73  ?Pulse: 73  ?Resp: (!) 181  ?Temp: (!) 97.3 ?F (36.3 ?C)  ?TempSrc: Temporal  ?SpO2: 97%  ?Weight: 177 lb 4.8 oz (80.4 kg)  ?Height: 5' 3.5" (1.613 m)  ? ?Body mass index is 30.91 kg/m?. ?GENERAL: The patient is a well-nourished female, in no acute distress. The vital signs are documented above. ?CARDIAC: There is a regular rate and rhythm.  ?VASCULAR: I do not detect carotid bruits. ?She has palpable femoral, popliteal, posterior tibial, and dorsalis pedis pulses bilaterally. ?She does have some slight discoloration of her toes on both feet. ? ? ?She does have varicose veins of both lower extremities.  In addition she has multiple spider veins and reticular veins. ? ? ? ? ? ? ?I did look at her left great saphenous vein myself with the SonoSite.  The vein is dilated down to the knee with significant reflux noted. ? ?PULMONARY: There is good air exchange bilaterally without wheezing or rales. ?ABDOMEN: Soft and non-tender with normal  pitched bowel sounds.  ?MUSCULOSKELETAL: There are no major deformities. ?NEUROLOGIC: No focal weakness or paresthesias are detected. ?SKIN: There are  no ulcers or rashes noted. ?PSYCHIATRIC: The patient has a normal affect. ? ?DATA:   ? ?VENOUS DUPLEX: I have reviewed the venous duplex scan that was done on 05/23/2021. ? ?On the right side there was no evidence of DVT.  There was no deep venous reflux.  There was some reflux in the right great saphenous vein from the mid thigh down to the proximal calf.  The vein was dilated with diameters ranging from 5 to 7 mm.  There was no reflux at the saphenofemoral junction. ? ? ? ? ?On the left side, there was no evidence of DVT.  There was deep venous reflux in the common femoral vein.  There was superficial venous reflux in the left great saphenous vein from the saphenofemoral junction to the distal thigh.  Diameters of the vein ranged from 5 to 7 mm. ? ? ? ? ?ARTERIAL DOPPLER STUDY: I have reviewed the arterial Doppler study that was done on 05/23/2021. ? ?On the right side there is a triphasic dorsalis pedis and posterior tibial signal.  ABI was 100%.  Toe pressures 126 mmHg. ? ?On the left side there was a triphasic dorsalis pedis and posterior tibial signal.  ABI was 100%.  Toe pressure was 116 mmHg. ? ?Deitra Mayo ?[facility] ?

## 2021-06-12 ENCOUNTER — Other Ambulatory Visit: Payer: Self-pay | Admitting: Orthopedic Surgery

## 2021-06-12 DIAGNOSIS — R0781 Pleurodynia: Secondary | ICD-10-CM

## 2021-06-12 DIAGNOSIS — M545 Low back pain, unspecified: Secondary | ICD-10-CM | POA: Diagnosis not present

## 2021-06-12 DIAGNOSIS — M25551 Pain in right hip: Secondary | ICD-10-CM | POA: Diagnosis not present

## 2021-06-13 ENCOUNTER — Ambulatory Visit
Admission: RE | Admit: 2021-06-13 | Discharge: 2021-06-13 | Disposition: A | Discharge status: Home / Self Care | Payer: Medicare Other | Source: Ambulatory Visit | Attending: Orthopedic Surgery | Admitting: Orthopedic Surgery

## 2021-06-13 DIAGNOSIS — M419 Scoliosis, unspecified: Secondary | ICD-10-CM | POA: Diagnosis not present

## 2021-06-13 DIAGNOSIS — R0781 Pleurodynia: Secondary | ICD-10-CM

## 2021-06-13 DIAGNOSIS — I2510 Atherosclerotic heart disease of native coronary artery without angina pectoris: Secondary | ICD-10-CM | POA: Diagnosis not present

## 2021-06-13 DIAGNOSIS — K449 Diaphragmatic hernia without obstruction or gangrene: Secondary | ICD-10-CM | POA: Diagnosis not present

## 2021-06-13 DIAGNOSIS — S2249XA Multiple fractures of ribs, unspecified side, initial encounter for closed fracture: Secondary | ICD-10-CM | POA: Diagnosis not present

## 2021-06-17 ENCOUNTER — Telehealth: Payer: Self-pay

## 2021-06-17 NOTE — Telephone Encounter (Signed)
Pt called and stated that she had a CT done on her upper body to look at her spine 06/13/2021. The provider who ordered this scan also told her that the scan showed coronary artery calcification and aortic atherosclerosis. Pt would like for you to review these results. Please advise. ?

## 2021-06-20 DIAGNOSIS — K449 Diaphragmatic hernia without obstruction or gangrene: Secondary | ICD-10-CM | POA: Diagnosis not present

## 2021-06-20 DIAGNOSIS — K219 Gastro-esophageal reflux disease without esophagitis: Secondary | ICD-10-CM | POA: Diagnosis not present

## 2021-07-03 ENCOUNTER — Encounter (HOSPITAL_BASED_OUTPATIENT_CLINIC_OR_DEPARTMENT_OTHER): Payer: Self-pay | Admitting: Obstetrics & Gynecology

## 2021-07-03 ENCOUNTER — Other Ambulatory Visit (HOSPITAL_COMMUNITY)
Admission: RE | Admit: 2021-07-03 | Discharge: 2021-07-03 | Disposition: A | Discharge status: Home / Self Care | Payer: Medicare Other | Source: Ambulatory Visit | Attending: Obstetrics & Gynecology | Admitting: Obstetrics & Gynecology

## 2021-07-03 ENCOUNTER — Ambulatory Visit (INDEPENDENT_AMBULATORY_CARE_PROVIDER_SITE_OTHER): Payer: Medicare Other | Admitting: Obstetrics & Gynecology

## 2021-07-03 VITALS — BP 136/73 | HR 79 | Ht 64.5 in | Wt 176.0 lb

## 2021-07-03 DIAGNOSIS — N3941 Urge incontinence: Secondary | ICD-10-CM | POA: Diagnosis not present

## 2021-07-03 DIAGNOSIS — Z124 Encounter for screening for malignant neoplasm of cervix: Secondary | ICD-10-CM

## 2021-07-03 DIAGNOSIS — N952 Postmenopausal atrophic vaginitis: Secondary | ICD-10-CM | POA: Diagnosis not present

## 2021-07-03 DIAGNOSIS — N6002 Solitary cyst of left breast: Secondary | ICD-10-CM

## 2021-07-03 DIAGNOSIS — Z780 Asymptomatic menopausal state: Secondary | ICD-10-CM

## 2021-07-03 NOTE — Progress Notes (Addendum)
76 y.o. G2P2 Married White or Caucasian female here for new patient exam.  Reports over the past two months, she's had two upper respiratory  infections, one was Covid.  As these were significant enough, she had a CXR which showed mildly enlarged heart and hiatal hernia.  Has seen Dr. Rosendo Gros.  CT showed a partially collapsed vertebrae.  She does have scoliosis.  Has been getting bone density testing.  Is on Fosamax.    Did have abnormal mammogram showing breast cyst.  This was done not too far after having her last covid booster.  Has follow up after August.    Denies vaginal bleeding.  Not on HRT.  Was on HRT for a short amount of time due to vaginal atrophic changes.  Not SA.  No LMP recorded. Patient is postmenopausal.          Sexually active: No.  H/O STD:  no  Health Maintenance: PCP:  Kathyrn Lass.   Colonoscopy:  Dr. Earlean Shawl, 05/01/2021.  Polyps MMG:  earlier this year.  Will have release signed.   BMD:  does with Dr. Sabra Heck Last pap smear:  pt desires being done today.   H/o abnormal pap smear:  no   reports that she has never smoked. She has never used smokeless tobacco. She reports current alcohol use. She reports that she does not use drugs.  Past Medical History:  Diagnosis Date   Compression fracture    lumbar    Sensory - neural hearing loss    right    Past Surgical History:  Procedure Laterality Date   BONE ANCHORED HEARING AID IMPLANT Right 10/10/2014   Procedure: BONE ANCHORED HEARING AID (BAHA) IMPLANT TEMPORAL BONE RIGHT EAR;  Surgeon: Vicie Mutters, MD;  Location: [place];  Service: ENT;  Laterality: Right;   BREAST SURGERY     breast reduction   CARPAL TUNNEL RELEASE Right    HIATAL HERNIA REPAIR     ROTATOR CUFF REPAIR Right    STAPEDECTOMY Bilateral     Current Outpatient Medications  Medication Sig Dispense Refill   acetaminophen (TYLENOL) 500 MG tablet Take 500 mg by mouth every 6 (six) hours as needed.     calcium carbonate (OS-CAL)  1250 (500 Ca) MG chewable tablet Chew 1 capsule by mouth daily.     cetirizine (ZYRTEC) 10 MG tablet Take 1 tablet by mouth as needed.     Cholecalciferol 125 MCG (5000 UT) capsule Take 1 capsule by mouth daily.     Collagenase POWD Take 1 Scoop by mouth daily.     FOSAMAX 70 MG tablet Take 1 tablet by mouth once a week.     Magnesium 250 MG TABS Take 1 tablet by mouth daily.     Multiple Vitamin (MULTI-VITAMIN DAILY PO) Take 1 tablet by mouth daily.     omeprazole (PRILOSEC) 40 MG capsule 40 mg 2 (two) times daily.     Probiotic Product (MISC INTESTINAL FLORA REGULAT) CAPS Take 1 capsule by mouth daily.     rosuvastatin (CRESTOR) 10 MG tablet Take 10 mg by mouth at bedtime.     tretinoin (RETIN-A) 0.05 % cream Apply 1 application. topically as needed.     vitamin E 45 MG (100 UNITS) capsule Take 1 capsule by mouth daily.     No current facility-administered medications for this visit.    Family History  Problem Relation Age of Onset   Cancer Sister     Review of Systems  All  other systems reviewed and are negative.  Exam:   BP 136/73 (BP Location: Left Arm, Patient Position: Sitting, Cuff Size: Large)   Pulse 79   Ht 5' 4.5" (1.638 m) Comment: reported  Wt 176 lb (79.8 kg)   BMI 29.74 kg/m   Height: 5' 4.5" (163.8 cm) (reported)  General appearance: alert, cooperative and appears stated age Breasts: normal appearance, no masses or tenderness Abdomen: soft, non-tender; bowel sounds normal; no masses,  no organomegaly Lymph nodes: Cervical, supraclavicular, and axillary nodes normal.  No abnormal inguinal nodes palpated Neurologic: Grossly normal  Pelvic: External genitalia:  no lesions              Urethra:  normal appearing urethra with no masses, tenderness or lesions              Bartholins and Skenes: normal                 Vagina: normal appearing vagina with atrophic changes and no discharge, no lesions              Cervix: no lesions              Pap taken: Yes.    Bimanual Exam:  Uterus:  normal size, contour, position, consistency, mobility, non-tender              Adnexa: normal adnexa and no mass, fullness, tenderness               Rectovaginal: Confirms               Anus:  normal sphincter tone, no lesions  Chaperone, Octaviano Batty, CMA, was present for exam.  Assessment/Plan: 1. Postmenopausal - not on HRT  2. Vaginal atrophy - no treatment needed at this time  3. Cervical cancer screening - Cytology - PAP( [facility]) - PR OBTAINING SCREEN PAP SMEAR  4. Cyst of left breast - has follow up scheduled in August per pt.  Release of records from [facility] signed today.

## 2021-07-04 LAB — CYTOLOGY - PAP: Diagnosis: NEGATIVE

## 2021-07-04 NOTE — Addendum Note (Signed)
Addended by: Megan Salon on: 07/04/2021 05:58 AM   Modules accepted: Orders, Level of Service

## 2021-07-10 ENCOUNTER — Encounter (HOSPITAL_BASED_OUTPATIENT_CLINIC_OR_DEPARTMENT_OTHER): Payer: Self-pay | Admitting: *Deleted

## 2021-07-10 NOTE — Telephone Encounter (Signed)
Reviewed. I tried calling the patient, it went to voicemail.  Coronary and aorta calcification noted on CT scan. If no symptoms of chest pain or dyspnea, no further testing is needed. Patient is already on appropriate statin therapy with well controlled lipids.   Thanks MJP

## 2021-07-11 NOTE — Telephone Encounter (Signed)
Tried calling patient no answer left a vm

## 2021-07-11 NOTE — Telephone Encounter (Signed)
Pt returned call and made aware of results//ah

## 2021-07-15 ENCOUNTER — Ambulatory Visit: Payer: Medicare Other | Attending: Obstetrics & Gynecology | Admitting: Physical Therapy

## 2021-07-15 ENCOUNTER — Encounter: Payer: Self-pay | Admitting: Physical Therapy

## 2021-07-15 DIAGNOSIS — N3941 Urge incontinence: Secondary | ICD-10-CM | POA: Diagnosis not present

## 2021-07-15 DIAGNOSIS — M6281 Muscle weakness (generalized): Secondary | ICD-10-CM | POA: Insufficient documentation

## 2021-07-15 DIAGNOSIS — R279 Unspecified lack of coordination: Secondary | ICD-10-CM | POA: Insufficient documentation

## 2021-07-15 DIAGNOSIS — R293 Abnormal posture: Secondary | ICD-10-CM | POA: Insufficient documentation

## 2021-07-15 NOTE — Therapy (Signed)
OUTPATIENT PHYSICAL THERAPY FEMALE PELVIC EVALUATION   Patient Name: [name] MRN: [id number] DOB:May 30, 1945, 76 y.o., female Today's Date: 07/15/2021   PT End of Session - 07/15/21 1220     Visit Number 1    Date for PT Re-Evaluation 10/15/21    Authorization Type medicare    PT Start Time 1145    PT Stop Time 1224    PT Time Calculation (min) 39 min    Activity Tolerance Patient tolerated treatment well    Behavior During Therapy WFL for tasks assessed/performed             Past Medical History:  Diagnosis Date   Compression fracture    lumbar    Sensory - neural hearing loss    right   Past Surgical History:  Procedure Laterality Date   BONE ANCHORED HEARING AID IMPLANT Right 10/10/2014   Procedure: BONE ANCHORED HEARING AID (BAHA) IMPLANT TEMPORAL BONE RIGHT EAR;  Surgeon: Vicie Mutters, MD;  Location: [place];  Service: ENT;  Laterality: Right;   BREAST SURGERY     breast reduction   CARPAL TUNNEL RELEASE Right    ROTATOR CUFF REPAIR Right    STAPEDECTOMY Bilateral    UMBILICAL HERNIA REPAIR     Patient Active Problem List   Diagnosis Date Noted   Atrial dilatation 05/20/2021   Constipation 05/20/2021   Hearing loss 05/20/2021   Hiatal hernia 05/20/2021   Iron deficiency anemia 05/20/2021   Lesion of liver 05/20/2021   Obesity 05/20/2021   Osteochondropathy 05/20/2021   Personal history of colonic polyps 05/20/2021   Primary osteoarthritis 05/20/2021   Pulmonary hypertension ([place]) 05/20/2021   Pure hypercholesterolemia 05/20/2021   Vitamin D deficiency 05/20/2021   Laryngopharyngeal reflux (LPR) 05/09/2021   Chronic cough 05/08/2021   Family history of thyroid cancer 05/08/2021   Ascending aorta dilatation (HCC) 04/09/2021   Abnormal echocardiogram 04/09/2021   Left atrial enlargement 04/09/2021   Mild tricuspid regurgitation 04/09/2021    PCP: Kathyrn Lass, MD  REFERRING PROVIDER: Megan Salon, MD  REFERRING DIAG:  N39.41 (ICD-10-CM) - Urge incontinence  THERAPY DIAG:  Muscle weakness (generalized)  Unspecified lack of coordination  Abnormal posture  Rationale for Evaluation and Treatment Rehabilitation  ONSET DATE: several years  SUBJECTIVE:                                                                                                                                                                                           SUBJECTIVE STATEMENT: Pt reports she has strong urgency and incontinence with drinking a lot water, coffee (2 cups), not getting to the bathroom  quickly enough after having urge.  Fluid intake: Yes: At least 3 yeti cups, 2 cups of coffee per day, sometimes glass of wine     PAIN:  Are you having pain? No   PRECAUTIONS: None  WEIGHT BEARING RESTRICTIONS No  FALLS:  Has patient fallen in last 6 months? No  LIVING ENVIRONMENT: Lives with: lives with their family Lives in: House/apartment   OCCUPATION: real Sport and exercise psychologist    PLOF: Independent  PATIENT GOALS to have less leakage   PERTINENT HISTORY:  CXR which showed mildly enlarged heart and hiatal hernia,CT showed partially collapsed vertebrae, scoliosis,  Sexual abuse: No  BOWEL MOVEMENT Pain with bowel movement: No Type of bowel movement:Type (Bristol Stool Scale) 4, Frequency daily, and Strain No Fully empty rectum: Yes: most of the time fully empty but sometimes feels like she needs to go a second time Leakage: No Pads: No Fiber supplement: No  URINATION Pain with urination: No Fully empty bladder: No sometimes waits a little bit after feeling done then more empties Stream: Strong Urgency: Yes:   Frequency: 2-3 hours sometimes but less when drinking irritants, doesn't get up at night but sometimes does get up at 5am to urinate.  Leakage: Urge to void and Walking to the bathroom Pads: Yes: 1-2 pad per day   INTERCOURSE Pain with intercourse:  not active   PREGNANCY Vaginal deliveries  2 Tearing Yes:   C-section deliveries 0 Currently pregnant No  PROLAPSE None    OBJECTIVE:   DIAGNOSTIC FINDINGS:   COGNITION:  Overall cognitive status: Within functional limits for tasks assessed     SENSATION:  Light touch: Appears intact  Proprioception: Appears intact  MUSCLE LENGTH: Bil adductors and hamstrings limited by 25%                POSTURE:  scoliosis with Rt rib hump, posterior pelvic tilt and rounded shoulders     LUMBARAROM/PROM  Side bending bil to fingertips to knee; rotation limited by 25% bil; flexion and extension limited by 25%  LOWER EXTREMITY ROM:  Bil WFL  LOWER EXTREMITY MMT:  Bil hip abduction 3+/5; adduction and extension 4/5; flexion 4+/5. Knees and ankles 5/5  PALPATION:   General  mild tenderness to palpation at Lt upper/mid quadrant but reports she thinks is from her hernia. Pt did not have fascial restrictions noted today    External Perineal Exam not TTP but did have redness at vulva and noted dried stool throughout perineal area. Pt reports she has had three large bowel movements today and has not had time to shower since this. Pt able to clean self well as to prevent risk of stool around vaginal opening or other skin irritation.                       Internal Pelvic Floor no TTP.   Patient confirms identification and approves PT to assess internal pelvic floor and treatment Yes  PELVIC MMT:   MMT eval  Vaginal 1/5 with cues; 0s isometric and 2 reps  Internal Anal Sphincter   External Anal Sphincter   Puborectalis   Diastasis Recti   (Blank rows = not tested)        TONE: decreased  PROLAPSE: Unable to assess fully with pt's body position and mechanics  TODAY'S TREATMENT  EVAL Examination completed, findings reviewed, pt educated on POC, HEP,  voiding mechanics. Pt motivated to participate in PT and agreeable to attempt recommendations.  PATIENT EDUCATION:  Education details: [phone] Person educated:  Patient Education method: Explanation, Demonstration, Tactile cues, Verbal cues, and Handouts Education comprehension: verbalized understanding and returned demonstration   HOME EXERCISE PROGRAM: [id number]  ASSESSMENT:  CLINICAL IMPRESSION: Patient is a 76 y.o. female  who was seen today for physical therapy evaluation and treatment for urinary incontinence. Pt found to have decreased flexibility in spine and hips bil, decreased hip and core strength, and pt reports a chronic history of urinary incontinence with not getting to bathroom quickly enough or waiting too long to urine with limited time from warning to leakage. Pt consented to internal vaginal assessment this date and found to have decreased strength, coordination, and endurance, with pt unable to have pelvic floor contraction initially but with max cues had slight improvement to 1/5. Pt educated on HEP to continue to work on this at home and to attempt step stool for bowel movements to improve pt's ability to fully empty stool and attempt to decrease amount of wiping post bowel movements as pt did have stool externally throughout perineum reporting she usually has to shower after bowel movements to fully clean. Pt would benefit from additional PT to further address deficits.     OBJECTIVE IMPAIRMENTS decreased coordination, decreased endurance, decreased mobility, decreased strength, impaired flexibility, impaired tone, improper body mechanics, and postural dysfunction.   ACTIVITY LIMITATIONS continence  PARTICIPATION LIMITATIONS: community activity  PERSONAL FACTORS Time since onset of injury/illness/exacerbation are also affecting patient's functional outcome.   REHAB POTENTIAL: Good  CLINICAL DECISION MAKING: Stable/uncomplicated  EVALUATION COMPLEXITY: Low   GOALS: Goals reviewed with patient? Yes  SHORT TERM GOALS: Target date: 08/12/2021  Pt to be I with HEP.  Baseline: Goal status: INITIAL  2.  Pt to demonstrate  at least 2/5 pelvic floor strength for improved pelvic stability and decreased strain at pelvic floor/ decrease leakage.  Baseline:  Goal status: INITIAL  3.  Pt to report decreased urinary leakage by 25% due to improvement with urge drill, coordination of pelvic floor, and/or pelvic floor strengthening.  Baseline:  Goal status: INITIAL  4.  Pt to be I with recall of voiding and reaching mechanics to decrease need of wiping several times after bowel movement for hygiene.  Baseline:  Goal status: INITIAL    LONG TERM GOALS: Target date:  10/15/2021   Pt to be I with advanced HEP.  Baseline:  Goal status: INITIAL  2.  Pt to demonstrate at least 4+/5 bil hip strength for improved pelvic stability and functional squats without leakage.  Baseline:  Goal status: INITIAL  3.  Pt to demonstrate at least 3/5 pelvic floor strength for improved pelvic stability and decreased strain at pelvic floor/ decrease leakage.  Baseline:  Goal status: INITIAL  4.  Pt to report decreased urinary leakage by 75% due to improvement with urge drill, coordination of pelvic floor, and/or pelvic floor strengthening.  Baseline:  Goal status: INITIAL    PLAN: PT FREQUENCY: 1x/week  PT DURATION:  10 sessions  PLANNED INTERVENTIONS: Therapeutic exercises, Therapeutic activity, Neuromuscular re-education, Patient/Family education, Joint mobilization, Dry Needling, Spinal mobilization, Cryotherapy, Moist heat, Manual lymph drainage, scar mobilization, Taping, Biofeedback, and Manual therapy  PLAN FOR NEXT SESSION: urge drill, knack, coordination of pelvic floor and breathing, hip/core strengthening for pelvic floor strengthening as well   Stacy Gardner, PT, DPT 06/13/234:08 PM

## 2021-07-23 ENCOUNTER — Encounter (HOSPITAL_BASED_OUTPATIENT_CLINIC_OR_DEPARTMENT_OTHER): Payer: Self-pay | Admitting: *Deleted

## 2021-08-11 ENCOUNTER — Ambulatory Visit: Payer: Medicare Other | Admitting: Physical Therapy

## 2021-08-14 DIAGNOSIS — L570 Actinic keratosis: Secondary | ICD-10-CM | POA: Diagnosis not present

## 2021-08-14 DIAGNOSIS — Z85828 Personal history of other malignant neoplasm of skin: Secondary | ICD-10-CM | POA: Diagnosis not present

## 2021-08-14 DIAGNOSIS — L814 Other melanin hyperpigmentation: Secondary | ICD-10-CM | POA: Diagnosis not present

## 2021-08-14 DIAGNOSIS — L718 Other rosacea: Secondary | ICD-10-CM | POA: Diagnosis not present

## 2021-08-14 DIAGNOSIS — C44629 Squamous cell carcinoma of skin of left upper limb, including shoulder: Secondary | ICD-10-CM | POA: Diagnosis not present

## 2021-08-14 DIAGNOSIS — D485 Neoplasm of uncertain behavior of skin: Secondary | ICD-10-CM | POA: Diagnosis not present

## 2021-08-14 DIAGNOSIS — L821 Other seborrheic keratosis: Secondary | ICD-10-CM | POA: Diagnosis not present

## 2021-08-14 DIAGNOSIS — D1801 Hemangioma of skin and subcutaneous tissue: Secondary | ICD-10-CM | POA: Diagnosis not present

## 2021-08-14 DIAGNOSIS — L738 Other specified follicular disorders: Secondary | ICD-10-CM | POA: Diagnosis not present

## 2021-08-14 DIAGNOSIS — L720 Epidermal cyst: Secondary | ICD-10-CM | POA: Diagnosis not present

## 2021-08-21 ENCOUNTER — Ambulatory Visit: Payer: Medicare Other | Admitting: Physical Therapy

## 2021-09-18 DIAGNOSIS — M79621 Pain in right upper arm: Secondary | ICD-10-CM | POA: Diagnosis not present

## 2021-10-08 ENCOUNTER — Other Ambulatory Visit: Payer: Medicare Other

## 2021-10-09 DIAGNOSIS — L720 Epidermal cyst: Secondary | ICD-10-CM | POA: Diagnosis not present

## 2021-10-09 DIAGNOSIS — L821 Other seborrheic keratosis: Secondary | ICD-10-CM | POA: Diagnosis not present

## 2021-10-09 DIAGNOSIS — Z85828 Personal history of other malignant neoplasm of skin: Secondary | ICD-10-CM | POA: Diagnosis not present

## 2021-10-09 DIAGNOSIS — L718 Other rosacea: Secondary | ICD-10-CM | POA: Diagnosis not present

## 2021-10-09 DIAGNOSIS — L570 Actinic keratosis: Secondary | ICD-10-CM | POA: Diagnosis not present

## 2021-10-09 DIAGNOSIS — L738 Other specified follicular disorders: Secondary | ICD-10-CM | POA: Diagnosis not present

## 2021-10-09 DIAGNOSIS — L905 Scar conditions and fibrosis of skin: Secondary | ICD-10-CM | POA: Diagnosis not present

## 2021-10-13 ENCOUNTER — Ambulatory Visit: Payer: Medicare Other | Admitting: Cardiology

## 2021-10-22 DIAGNOSIS — Z6831 Body mass index (BMI) 31.0-31.9, adult: Secondary | ICD-10-CM | POA: Diagnosis not present

## 2021-10-22 DIAGNOSIS — E559 Vitamin D deficiency, unspecified: Secondary | ICD-10-CM | POA: Diagnosis not present

## 2021-10-22 DIAGNOSIS — E042 Nontoxic multinodular goiter: Secondary | ICD-10-CM | POA: Diagnosis not present

## 2021-10-22 DIAGNOSIS — J309 Allergic rhinitis, unspecified: Secondary | ICD-10-CM | POA: Diagnosis not present

## 2021-10-22 DIAGNOSIS — E7800 Pure hypercholesterolemia, unspecified: Secondary | ICD-10-CM | POA: Diagnosis not present

## 2021-10-22 DIAGNOSIS — K130 Diseases of lips: Secondary | ICD-10-CM | POA: Diagnosis not present

## 2021-10-22 DIAGNOSIS — K760 Fatty (change of) liver, not elsewhere classified: Secondary | ICD-10-CM | POA: Diagnosis not present

## 2021-11-03 DIAGNOSIS — Z1231 Encounter for screening mammogram for malignant neoplasm of breast: Secondary | ICD-10-CM | POA: Diagnosis not present

## 2021-11-06 ENCOUNTER — Ambulatory Visit: Payer: Medicare Other

## 2021-11-06 DIAGNOSIS — I517 Cardiomegaly: Secondary | ICD-10-CM

## 2021-11-06 DIAGNOSIS — R931 Abnormal findings on diagnostic imaging of heart and coronary circulation: Secondary | ICD-10-CM

## 2021-11-06 DIAGNOSIS — I361 Nonrheumatic tricuspid (valve) insufficiency: Secondary | ICD-10-CM

## 2021-11-11 DIAGNOSIS — Z23 Encounter for immunization: Secondary | ICD-10-CM | POA: Diagnosis not present

## 2021-11-11 DIAGNOSIS — K449 Diaphragmatic hernia without obstruction or gangrene: Secondary | ICD-10-CM | POA: Diagnosis not present

## 2021-11-11 DIAGNOSIS — M859 Disorder of bone density and structure, unspecified: Secondary | ICD-10-CM | POA: Diagnosis not present

## 2021-11-11 DIAGNOSIS — Z0000 Encounter for general adult medical examination without abnormal findings: Secondary | ICD-10-CM | POA: Diagnosis not present

## 2021-11-11 DIAGNOSIS — M79671 Pain in right foot: Secondary | ICD-10-CM | POA: Diagnosis not present

## 2021-11-11 DIAGNOSIS — Z85828 Personal history of other malignant neoplasm of skin: Secondary | ICD-10-CM | POA: Diagnosis not present

## 2021-11-11 DIAGNOSIS — E7800 Pure hypercholesterolemia, unspecified: Secondary | ICD-10-CM | POA: Diagnosis not present

## 2021-11-11 DIAGNOSIS — M25511 Pain in right shoulder: Secondary | ICD-10-CM | POA: Diagnosis not present

## 2021-11-11 DIAGNOSIS — H9190 Unspecified hearing loss, unspecified ear: Secondary | ICD-10-CM | POA: Diagnosis not present

## 2021-11-11 DIAGNOSIS — E669 Obesity, unspecified: Secondary | ICD-10-CM | POA: Diagnosis not present

## 2021-11-11 DIAGNOSIS — E559 Vitamin D deficiency, unspecified: Secondary | ICD-10-CM | POA: Diagnosis not present

## 2021-11-11 NOTE — Progress Notes (Signed)
Patient referred by Kathyrn Lass, MD for Ascending aorta dilatation, severe LA enlargement  Subjective:   [name], female    DOB: [birth year], 76 y.o.   MRN: [id number]   No chief complaint on file.    HPI  76 year old Caucasian female with uncontrolled hyperlipidemia, chronic cough, no tactile hernia, referred for evaluation of echocardiogram abnormalities.  Patient is here with her husband.  She is very pleasant lady, who is fairly active with regular walking etc.  Ever since she had COVID few months ago, she is experienced chronic cough and congestion in the sinuses to the point that her voice is changed.  On work-up for chronic cough, she was found to have a large hiatal hernia.  She was scheduled to undergo colonoscopy as well as EGD with gastroenterology.  However, screening echocardiogram showed certain abnormalities, including severe left atrial enlargement, mild dilation of ascending aorta 38 mm, mildly elevated PASP 39 mmHg.   Patient denies any chest pain or shortness of breath at baseline.  She also denies any orthopnea, PND, leg edema symptoms.  She has no known history of atrial fibrillation, denies any palpitation symptoms.     Current Outpatient Medications:  .  acetaminophen (TYLENOL) 500 MG tablet, Take 500 mg by mouth every 6 (six) hours as needed., Disp: , Rfl:  .  calcium carbonate (OS-CAL) 1250 (500 Ca) MG chewable tablet, Chew 1 capsule by mouth daily., Disp: , Rfl:  .  cetirizine (ZYRTEC) 10 MG tablet, Take 1 tablet by mouth as needed., Disp: , Rfl:  .  Cholecalciferol 125 MCG (5000 UT) capsule, Take 1 capsule by mouth daily., Disp: , Rfl:  .  Collagenase POWD, Take 1 Scoop by mouth daily., Disp: , Rfl:  .  FOSAMAX 70 MG tablet, Take 1 tablet by mouth once a week., Disp: , Rfl:  .  Magnesium 250 MG TABS, Take 1 tablet by mouth daily., Disp: , Rfl:  .  Multiple Vitamin (MULTI-VITAMIN DAILY PO), Take 1 tablet by mouth daily., Disp: , Rfl:  .   omeprazole (PRILOSEC) 40 MG capsule, 40 mg 2 (two) times daily., Disp: , Rfl:  .  Probiotic Product (MISC INTESTINAL FLORA REGULAT) CAPS, Take 1 capsule by mouth daily., Disp: , Rfl:  .  rosuvastatin (CRESTOR) 10 MG tablet, Take 10 mg by mouth at bedtime., Disp: , Rfl:  .  tretinoin (RETIN-A) 0.05 % cream, Apply 1 application. topically as needed., Disp: , Rfl:  .  vitamin E 45 MG (100 UNITS) capsule, Take 1 capsule by mouth daily., Disp: , Rfl:    Cardiovascular and other pertinent studies:  Reviewed external labs and tests, independently interpreted  Echocardiogram 11/06/2021:  Left ventricle cavity is normal in size and wall thickness. Normal global  wall motion. Normal LV systolic function with EF 61%. Indeterminate  diastolic filling pattern.  Left atrial cavity is severely dilated.  Right atrial cavity is mildly dilated.  Structurally normal mitral valve.  Mild to moderate mitral regurgitation.  Structurally normal tricuspid valve.  Moderate tricuspid regurgitation.  Estimated pulmonary artery systolic pressure 30 mmHg.  Mild to moderate pulmonic regurgitation.  Echogenic structure in liver parenchyma, possible a cyst.  Personally reviewed and compared with previous study on 04/04/2021. No  significant change noted.   Mobile cardiac telemetry 12 days 04/08/2021 - 04/21/2021: Dominant rhythm: Sinus. HR 50-122 bpm. Avg HR 74 bpm, in sinus rhythm. 64 episodes of probable atrial tachycardia, fastest at 176 bpm for 18 beats, longest for 9.6 secs  at 138 bpm. <1% isolated SVE, couplet/triplets. 0 episodes of VT. <1% isolated VE, couplet, no triplets. No atrial fibrillation/atrial flutter/VT/high grade AV block, sinus pause >3sec noted. 0 patient triggered events.     EKG 04/08/2021: Sinus rhythm 72 bpm Short PR interval Old anteroseptal infarct Nonspecific ST depression   Recent labs: 10/30/2020: Glucose 104, BUN/Cr 17/0.78. EGFR 78. Na/K 14/4.5. Rest of the CMP normal H/H 14/40.  MCV 93. Platelets 240 HbA1C NA Chol 155, TG 142, HDL 61, LDL 70 TSH 1.4 normal    Review of Systems  HENT:  Positive for congestion.   Cardiovascular:  Negative for chest pain, dyspnea on exertion, leg swelling, palpitations and syncope.  Respiratory:  Positive for cough.          There were no vitals filed for this visit.    There is no height or weight on file to calculate BMI. There were no vitals filed for this visit.    Objective:   Physical Exam Vitals and nursing note reviewed.  Constitutional:      General: She is not in acute distress. Neck:     Vascular: No JVD.  Cardiovascular:     Rate and Rhythm: Normal rate and regular rhythm.     Heart sounds: Normal heart sounds. No murmur heard. Pulmonary:     Effort: Pulmonary effort is normal.     Breath sounds: Normal breath sounds. No wheezing or rales.  Musculoskeletal:     Right lower leg: No edema.     Left lower leg: No edema.         Visit diagnoses: No diagnosis found.    No orders of the defined types were placed in this encounter.     Assessment & Recommendations:   76 year old Caucasian female with uncontrolled hyperlipidemia, chronic cough, large hiatal hernia, referred for evaluation of echocardiogram abnormalities.  Echocardiogram shows mild MR, mild TR, mildly elevated RVSP, and severe left atrial enlargement.  Patient has no symptoms of dyspnea on exertion.  I am unable to explain her severe left atrial enlargement in absence of severe mitral stenosis or regurgitation.  There is no clear history suggestive of atrial fibrillation.  Nonetheless, I recommend 2-week cardiac telemetry as screening to evaluate for asymptomatic atrial fibrillation.  I do not think any of the echocardiogram abnormalities preclude her from undergoing EGD and colonoscopy in the near future.  I strongly suspect her chronic cough could be due to GERD in the setting of large hiatal hernia.  Avoid foods that can trigger  GERD, such as tomato, citrus foods, chocolate, caffeine.  Defer further work-up and management of possible GERD to gastroenterology, since she has upcoming procedures with them.  I will repeat echocardiogram in 6 months to reevaluate her above echocardiogram abnormalities.  Further recommendations after above testing.  Thank you for referring the patient to [place]. Please feel free to contact with any questions.   Nigel Mormon, MD Pager: [phone] Office: [phone]

## 2021-11-12 ENCOUNTER — Encounter: Payer: Self-pay | Admitting: Cardiology

## 2021-11-12 ENCOUNTER — Ambulatory Visit: Payer: Medicare Other

## 2021-11-12 ENCOUNTER — Ambulatory Visit: Payer: Medicare Other | Admitting: Cardiology

## 2021-11-12 VITALS — BP 144/82 | HR 77 | Temp 97.8°F | Resp 16 | Ht 64.5 in | Wt 176.0 lb

## 2021-11-12 DIAGNOSIS — I517 Cardiomegaly: Secondary | ICD-10-CM

## 2021-11-12 DIAGNOSIS — I340 Nonrheumatic mitral (valve) insufficiency: Secondary | ICD-10-CM

## 2021-11-12 DIAGNOSIS — I361 Nonrheumatic tricuspid (valve) insufficiency: Secondary | ICD-10-CM

## 2021-11-12 DIAGNOSIS — R002 Palpitations: Secondary | ICD-10-CM | POA: Insufficient documentation

## 2021-11-26 DIAGNOSIS — H01015 Ulcerative blepharitis left lower eyelid: Secondary | ICD-10-CM | POA: Diagnosis not present

## 2021-11-26 DIAGNOSIS — H02055 Trichiasis without entropian left lower eyelid: Secondary | ICD-10-CM | POA: Diagnosis not present

## 2021-11-26 DIAGNOSIS — Z23 Encounter for immunization: Secondary | ICD-10-CM | POA: Diagnosis not present

## 2021-12-05 DIAGNOSIS — H01016 Ulcerative blepharitis left eye, unspecified eyelid: Secondary | ICD-10-CM | POA: Diagnosis not present

## 2021-12-05 DIAGNOSIS — H02055 Trichiasis without entropian left lower eyelid: Secondary | ICD-10-CM | POA: Diagnosis not present

## 2021-12-10 ENCOUNTER — Other Ambulatory Visit: Payer: Self-pay | Admitting: Physician Assistant

## 2021-12-10 DIAGNOSIS — R1310 Dysphagia, unspecified: Secondary | ICD-10-CM

## 2021-12-10 DIAGNOSIS — R040 Epistaxis: Secondary | ICD-10-CM | POA: Diagnosis not present

## 2021-12-10 DIAGNOSIS — R251 Tremor, unspecified: Secondary | ICD-10-CM | POA: Diagnosis not present

## 2021-12-10 DIAGNOSIS — M2669 Other specified disorders of temporomandibular joint: Secondary | ICD-10-CM | POA: Diagnosis not present

## 2021-12-10 DIAGNOSIS — J323 Chronic sphenoidal sinusitis: Secondary | ICD-10-CM | POA: Diagnosis not present

## 2021-12-10 DIAGNOSIS — K219 Gastro-esophageal reflux disease without esophagitis: Secondary | ICD-10-CM | POA: Diagnosis not present

## 2021-12-10 DIAGNOSIS — J3489 Other specified disorders of nose and nasal sinuses: Secondary | ICD-10-CM | POA: Diagnosis not present

## 2021-12-10 DIAGNOSIS — J342 Deviated nasal septum: Secondary | ICD-10-CM | POA: Diagnosis not present

## 2021-12-10 DIAGNOSIS — H6123 Impacted cerumen, bilateral: Secondary | ICD-10-CM | POA: Diagnosis not present

## 2021-12-16 DIAGNOSIS — I517 Cardiomegaly: Secondary | ICD-10-CM | POA: Diagnosis not present

## 2021-12-16 DIAGNOSIS — R002 Palpitations: Secondary | ICD-10-CM | POA: Diagnosis not present

## 2021-12-22 ENCOUNTER — Ambulatory Visit
Admission: RE | Admit: 2021-12-22 | Discharge: 2021-12-22 | Disposition: A | Discharge status: Home / Self Care | Payer: Medicare Other | Source: Ambulatory Visit | Attending: Physician Assistant | Admitting: Physician Assistant

## 2021-12-22 DIAGNOSIS — K224 Dyskinesia of esophagus: Secondary | ICD-10-CM | POA: Diagnosis not present

## 2021-12-22 DIAGNOSIS — K219 Gastro-esophageal reflux disease without esophagitis: Secondary | ICD-10-CM | POA: Diagnosis not present

## 2021-12-22 DIAGNOSIS — R1310 Dysphagia, unspecified: Secondary | ICD-10-CM | POA: Diagnosis not present

## 2021-12-24 DIAGNOSIS — Z85828 Personal history of other malignant neoplasm of skin: Secondary | ICD-10-CM | POA: Diagnosis not present

## 2021-12-24 DIAGNOSIS — L570 Actinic keratosis: Secondary | ICD-10-CM | POA: Diagnosis not present

## 2021-12-24 DIAGNOSIS — L309 Dermatitis, unspecified: Secondary | ICD-10-CM | POA: Diagnosis not present

## 2021-12-24 DIAGNOSIS — D485 Neoplasm of uncertain behavior of skin: Secondary | ICD-10-CM | POA: Diagnosis not present

## 2021-12-24 DIAGNOSIS — L720 Epidermal cyst: Secondary | ICD-10-CM | POA: Diagnosis not present

## 2021-12-24 DIAGNOSIS — L820 Inflamed seborrheic keratosis: Secondary | ICD-10-CM | POA: Diagnosis not present

## 2021-12-30 DIAGNOSIS — R002 Palpitations: Secondary | ICD-10-CM | POA: Diagnosis not present

## 2021-12-30 DIAGNOSIS — I340 Nonrheumatic mitral (valve) insufficiency: Secondary | ICD-10-CM | POA: Diagnosis not present

## 2021-12-30 DIAGNOSIS — I361 Nonrheumatic tricuspid (valve) insufficiency: Secondary | ICD-10-CM | POA: Diagnosis not present

## 2021-12-30 DIAGNOSIS — I517 Cardiomegaly: Secondary | ICD-10-CM | POA: Diagnosis not present

## 2021-12-30 NOTE — Progress Notes (Signed)
Discussed with the patient. She is now on metoprolol sucinate 25 mg daily, for tremor but could also help her atrial tachycardia. Tolerating well. Resting heart rate now around 60s (down form 80s).  Continue the same.   Nigel Mormon, MD

## 2022-01-14 ENCOUNTER — Ambulatory Visit: Payer: Self-pay | Admitting: General Surgery

## 2022-01-14 DIAGNOSIS — K449 Diaphragmatic hernia without obstruction or gangrene: Secondary | ICD-10-CM | POA: Diagnosis not present

## 2022-01-14 DIAGNOSIS — K219 Gastro-esophageal reflux disease without esophagitis: Secondary | ICD-10-CM | POA: Diagnosis not present

## 2022-01-14 NOTE — H&P (Signed)
Chief Complaint: Follow-up       History of Present Illness: [name] is a 76 y.o. female who is seen today as an office consultation at the request of Dr. Sabra Heck for evaluation of Follow-up .   Patient is a 76 year old female, comes in for follow-up for her hiatal hernia. Patient states that she has had some increased symptoms.  States she has had more dysphagia as well as some dry cough/clearing her throat recently.  Patient recently went esophagram.  She did show a large right hernia with approximate 75% of her stomach up in the chest cavity.  The results are below.   Patient is interested in discussing surgery and what would need to be done prior to surgery.   She has had a previous CT scan.  This did show a small 3.5 cm wide hiatal hernia.   FLUOROSCOPY: Radiation Exposure Index (as provided by the fluoroscopic device): 21 mGy Kerma   COMPARISON:  06/13/2021 chest CT.   FINDINGS: Normal oral and pharyngeal phases of swallowing, with no laryngeal penetration or tracheobronchial aspiration. No significant barium retention in the pharynx. No evidence of free atrial mass, stricture or diverticulum. No significant cricopharyngeus muscle dysfunction.   Fixed large hiatal hernia (approximately 75% of the stomach is intrathoracic). Normal gastric emptying. No gastroesophageal reflux elicited despite provocative maneuvers including water siphon test and Valsalva maneuver. There is prominent extrinsic mass-effect on the thoracic esophagus by the large hiatal hernia, with a near horizontal course of a portion of the midthoracic esophagus. Moderate esophageal dysmotility, characterized by intermittent weakening of primary peristalsis throughout the thoracic esophagus. The swallowed barium tablet was transiently delayed in the horizontal midportion of the thoracic esophagus, with eventual clearance of the tablet into the stomach with multiple barium swallows. No discrete  esophageal mass or definite stricture. Mild granularity of the thoracic esophageal mucosa suggest mild reflux esophagitis.   IMPRESSION: 1. Fixed large hiatal hernia (approximately 75% of the stomach is intrathoracic). No gastroesophageal reflux elicited. 2. Prominent extrinsic mass-effect on the thoracic esophagus by the large hiatal hernia, with a near horizontal course of a portion of the mid thoracic esophagus. Transient delay in passage of the swallowed barium tablet in the horizontal mid portion of the thoracic esophagus, with eventual pill clearance into the stomach with multiple barium swallows. 3. Evidence of mild reflux esophagitis. No esophageal mass or definite stricture. 4. Moderate esophageal dysmotility, characteristic of chronic reflux related dysmotility.     Review of Systems: A complete review of systems was obtained from the patient.  I have reviewed this information and discussed as appropriate with the patient.  See HPI as well for other ROS.   Review of Systems  Constitutional: Negative.   HENT: Negative.    Eyes: Negative.   Respiratory: Negative.    Cardiovascular: Negative.   Gastrointestinal:  Positive for heartburn.  Genitourinary: Negative.   Musculoskeletal: Negative.   Skin: Negative.   Neurological: Negative.   Endo/Heme/Allergies: Negative.   Psychiatric/Behavioral: Negative.          Medical History: Past Medical History Past Medical History: Diagnosis Date  Anemia    GERD (gastroesophageal reflux disease)    History of cancer        There is no problem list on file for this patient.     Past Surgical History Past Surgical History: Procedure Laterality Date  Bone Anchored Hearing Aid Implant Temporal Bone - Right Ear   10/10/2014   Dr. Fredirick Maudlin  APPENDECTOMY  Carpal Tunnel Right Wrist Surgery      LAPAROSCOPIC TUBAL LIGATION      REDUCTION MAMMAPLASTY      Rotator Cuff Right Shoulder Surgery      UMBILICAL HERNIA  REPAIR          Allergies No Known Allergies    Current Outpatient Medications on File Prior to Visit Medication Sig Dispense Refill  alendronate (FOSAMAX) 70 MG tablet 1 tablet 30 minutes before the first food, beverage or medicine of the day with plain water      metoprolol succinate (TOPROL-XL) 25 MG XL tablet Take 25 mg by mouth once daily      metroNIDAZOLE (METROLOTION) 0.75 % topical lotion metronidazole 0.75 % lotion  APPLY TOPICALLY TO FACE EVERY DAY AS DIRECTED      omeprazole (PRILOSEC) 40 MG DR capsule omeprazole 40 mg capsule,delayed release  TAKE 1 CAPSULE BY MOUTH TWICE DAILY 30 MINUTES BEFORE THE MORNING AND EVENING MEAL      predniSONE (DELTASONE) 10 MG tablet prednisone 10 mg tablet  take 1 tab three times a day for 2 days, 1 tab twice a day for 5 days, 1 tab daily till finished      rosuvastatin (CRESTOR) 10 MG tablet Take 1 tablet by mouth once daily       No current facility-administered medications on file prior to visit.     Family History Family History Problem Relation Age of Onset  Coronary Artery Disease (Blocked arteries around heart) Father    Skin cancer Sister    Breast cancer Sister    Skin cancer Brother        Social History   Tobacco Use Smoking Status Never Smokeless Tobacco Never     Social History Social History    Socioeconomic History  Marital status: Married Tobacco Use  Smoking status: Never  Smokeless tobacco: Never Substance and Sexual Activity  Alcohol use: Yes     Comment: Glass of wine occasionally  Drug use: Never      Objective:     There were no vitals filed for this visit.  There is no height or weight on file to calculate BMI.   Physical Exam Constitutional:      Appearance: Normal appearance.  HENT:     Head: Normocephalic and atraumatic.     Mouth/Throat:     Mouth: Mucous membranes are moist.     Pharynx: Oropharynx is clear.  Eyes:     General: No scleral icterus.    Pupils: Pupils are  equal, round, and reactive to light.  Cardiovascular:     Rate and Rhythm: Normal rate and regular rhythm.     Pulses: Normal pulses.     Heart sounds: No murmur heard.    No friction rub. No gallop.  Pulmonary:     Effort: Pulmonary effort is normal. No respiratory distress.     Breath sounds: Normal breath sounds. No stridor.  Abdominal:     General: Abdomen is flat.  Musculoskeletal:        General: No swelling.  Skin:    General: Skin is warm.  Neurological:     General: No focal deficit present.     Mental Status: She is alert and oriented to person, place, and time. Mental status is at baseline.  Psychiatric:        Mood and Affect: Mood normal.        Thought Content: Thought content normal.        Judgment:  Judgment normal.            Assessment and Plan: There are no diagnoses linked to this encounter.  [name] is a 76 y.o. female    1.  We will proceed to the OR for a robotic hiatal hernia repair with mesh and toupet fundoplication 2. All risks and benefits were discussed with the patient, to generally include infection, bleeding, damage to surrounding structures, possible pneumothorax, and recurrence. Alternatives were offered and described.  All questions were answered and the patient voiced understanding of the procedure and wishes to proceed at this point.

## 2022-01-27 DIAGNOSIS — R509 Fever, unspecified: Secondary | ICD-10-CM | POA: Diagnosis not present

## 2022-01-27 DIAGNOSIS — B349 Viral infection, unspecified: Secondary | ICD-10-CM | POA: Diagnosis not present

## 2022-01-27 DIAGNOSIS — R059 Cough, unspecified: Secondary | ICD-10-CM | POA: Diagnosis not present

## 2022-01-27 DIAGNOSIS — J101 Influenza due to other identified influenza virus with other respiratory manifestations: Secondary | ICD-10-CM | POA: Diagnosis not present

## 2022-02-04 DIAGNOSIS — Z6832 Body mass index (BMI) 32.0-32.9, adult: Secondary | ICD-10-CM | POA: Diagnosis not present

## 2022-02-04 DIAGNOSIS — N750 Cyst of Bartholin's gland: Secondary | ICD-10-CM | POA: Diagnosis not present

## 2022-02-24 NOTE — Pre-Procedure Instructions (Signed)
Surgical Instructions    Your procedure is scheduled on Tuesday, January 30th.  Report to [facility] Main Entrance "A" at 5:30 A.M., then check in with the Admitting office.  Call this number if you have problems the morning of surgery:  [phone]  If you have any questions prior to your surgery date call [phone]: Open Monday-Friday 8am-4pm If you experience any cold or flu symptoms such as cough, fever, chills, shortness of breath, etc. between now and your scheduled surgery, please notify us at the above number.     Remember:  Do not eat after midnight the night before your surgery  You may drink clear liquids until 4:30 a.m. the morning of your surgery.   Clear liquids allowed are: Water, Non-Citrus Juices (without pulp), Carbonated Beverages, Clear Tea, Black Coffee Only (NO MILK, CREAM OR POWDERED CREAMER of any kind), and Gatorade.    Enhanced Recovery after Surgery for Orthopedics Enhanced Recovery after Surgery is a protocol used to improve the stress on your body and your recovery after surgery.  Patient Instructions  The day of surgery (if you do NOT have diabetes):  Drink ONE (1) Pre-Surgery Clear Ensure by 4:30 am the morning of surgery   This drink was given to you during your hospital  pre-op appointment visit. Nothing else to drink after completing the  Pre-Surgery Clear Ensure.         If you have questions, please contact your surgeon's office.   Take these medicines the morning of surgery with A SIP OF WATER  omeprazole (PRILOSEC)    Take these medications AS NEEDED: acetaminophen (TYLENOL)  Eye drops cetirizine (ZYRTEC)   Make sure to take your evening dose of metoprolol succinate (TOPROL-XL) the night before surgery.   As of today, STOP taking any Aspirin (unless otherwise instructed by your surgeon) Aleve, Naproxen, Ibuprofen, Motrin, Advil, Goody's, BC's, all herbal medications, fish oil, and all vitamins.                     Do NOT Smoke  (Tobacco/Vaping) for 24 hours prior to your procedure.  If you use a CPAP at night, you may bring your mask/headgear for your overnight stay.   Contacts, glasses, piercing's, hearing aid's, dentures or partials may not be worn into surgery, please bring cases for these belongings.    For patients admitted to the hospital, discharge time will be determined by your treatment team.   Patients discharged the day of surgery will not be allowed to drive home, and someone needs to stay with them for 24 hours.  SURGICAL WAITING ROOM VISITATION Patients having surgery or a procedure may have no more than 2 support people in the waiting area - these visitors may rotate.   Children under the age of 24 must have an adult with them who is not the patient. If the patient needs to stay at the hospital during part of their recovery, the visitor guidelines for inpatient rooms apply. Pre-op nurse will coordinate an appropriate time for 1 support person to accompany patient in pre-op.  This support person may not rotate.   Please refer to the [facility] website for the visitor guidelines for Inpatients (after your surgery is over and you are in a regular room).    Special instructions:   [facility]- Preparing For Surgery  Before surgery, you can play an important role. Because skin is not sterile, your skin needs to be as free of germs as possible. You can reduce  the number of germs on your skin by washing with CHG (chlorahexidine gluconate) Soap before surgery.  CHG is an antiseptic cleaner which kills germs and bonds with the skin to continue killing germs even after washing.    Oral Hygiene is also important to reduce your risk of infection.  Remember - BRUSH YOUR TEETH THE MORNING OF SURGERY WITH YOUR REGULAR TOOTHPASTE  Please do not use if you have an allergy to CHG or antibacterial soaps. If your skin becomes reddened/irritated stop using the CHG.  Do not shave (including legs and underarms) for at  least 48 hours prior to first CHG shower. It is OK to shave your face.  Please follow these instructions carefully.   Shower the NIGHT BEFORE SURGERY and the MORNING OF SURGERY  If you chose to wash your hair, wash your hair first as usual with your normal shampoo.  After you shampoo, rinse your hair and body thoroughly to remove the shampoo.  Use CHG Soap as you would any other liquid soap. You can apply CHG directly to the skin and wash gently with a scrungie or a clean washcloth.   Apply the CHG Soap to your body ONLY FROM THE NECK DOWN.  Do not use on open wounds or open sores. Avoid contact with your eyes, ears, mouth and genitals (private parts). Wash Face and genitals (private parts)  with your normal soap.   Wash thoroughly, paying special attention to the area where your surgery will be performed.  Thoroughly rinse your body with warm water from the neck down.  DO NOT shower/wash with your normal soap after using and rinsing off the CHG Soap.  Pat yourself dry with a CLEAN TOWEL.  Wear CLEAN PAJAMAS to bed the night before surgery  Place CLEAN SHEETS on your bed the night before your surgery  DO NOT SLEEP WITH PETS.   Day of Surgery: Take a shower with CHG soap. Do not wear jewelry or makeup Do not wear lotions, powders, perfumes, or deodorant. Do not shave 48 hours prior to surgery.  Do not bring valuables to the hospital.  [facility] is not responsible for any belongings or valuables. Do not wear nail polish, gel polish, artificial nails, or any other type of covering on natural nails (fingers and toes) If you have artificial nails or gel coating that need to be removed by a nail salon, please have this removed prior to surgery. Artificial nails or gel coating may interfere with anesthesia's ability to adequately monitor your vital signs. Wear Clean/Comfortable clothing the morning of surgery Remember to brush your teeth WITH YOUR REGULAR TOOTHPASTE.   Please read  over the following fact sheets that you were given.    If you received a COVID test during your pre-op visit  it is requested that you wear a mask when out in public, stay away from anyone that may not be feeling well and notify your surgeon if you develop symptoms. If you have been in contact with anyone that has tested positive in the last 10 days please notify you surgeon.

## 2022-02-25 ENCOUNTER — Encounter (HOSPITAL_COMMUNITY): Payer: Self-pay

## 2022-02-25 ENCOUNTER — Encounter (HOSPITAL_COMMUNITY)
Admission: RE | Admit: 2022-02-25 | Discharge: 2022-02-25 | Disposition: A | Discharge status: Home / Self Care | Payer: Medicare Other | Source: Ambulatory Visit | Attending: General Surgery | Admitting: General Surgery

## 2022-02-25 ENCOUNTER — Other Ambulatory Visit: Payer: Self-pay

## 2022-02-25 VITALS — BP 143/71 | HR 73 | Temp 97.6°F | Resp 16 | Ht 64.0 in | Wt 184.0 lb

## 2022-02-25 DIAGNOSIS — K219 Gastro-esophageal reflux disease without esophagitis: Secondary | ICD-10-CM | POA: Insufficient documentation

## 2022-02-25 DIAGNOSIS — Z01812 Encounter for preprocedural laboratory examination: Secondary | ICD-10-CM | POA: Diagnosis not present

## 2022-02-25 DIAGNOSIS — Z79899 Other long term (current) drug therapy: Secondary | ICD-10-CM | POA: Diagnosis not present

## 2022-02-25 DIAGNOSIS — I10 Essential (primary) hypertension: Secondary | ICD-10-CM | POA: Insufficient documentation

## 2022-02-25 DIAGNOSIS — Z01818 Encounter for other preprocedural examination: Secondary | ICD-10-CM

## 2022-02-25 DIAGNOSIS — K449 Diaphragmatic hernia without obstruction or gangrene: Secondary | ICD-10-CM | POA: Diagnosis not present

## 2022-02-25 HISTORY — DX: Tremor, unspecified: R25.1

## 2022-02-25 HISTORY — DX: Cardiomegaly: I51.7

## 2022-02-25 HISTORY — DX: Fatty (change of) liver, not elsewhere classified: K76.0

## 2022-02-25 HISTORY — DX: Cyst of Bartholin's gland: N75.0

## 2022-02-25 HISTORY — DX: Other iron deficiency anemias: D50.8

## 2022-02-25 HISTORY — DX: Pure hypercholesterolemia, unspecified: E78.00

## 2022-02-25 HISTORY — DX: Liver disease, unspecified: K76.9

## 2022-02-25 HISTORY — DX: Nonrheumatic mitral (valve) insufficiency: I34.0

## 2022-02-25 HISTORY — DX: Personal history of colon polyps, unspecified: Z86.0100

## 2022-02-25 HISTORY — DX: Rheumatic tricuspid insufficiency: I07.1

## 2022-02-25 HISTORY — DX: Scoliosis, unspecified: M41.9

## 2022-02-25 HISTORY — DX: Vitamin D deficiency, unspecified: E55.9

## 2022-02-25 HISTORY — DX: Disorder of bone density and structure, unspecified: M85.9

## 2022-02-25 HISTORY — DX: Pulmonary hypertension, unspecified: I27.20

## 2022-02-25 HISTORY — DX: Anemia, unspecified: D64.9

## 2022-02-25 HISTORY — DX: Nontoxic multinodular goiter: E04.2

## 2022-02-25 HISTORY — DX: Personal history of other malignant neoplasm of skin: Z85.828

## 2022-02-25 HISTORY — DX: Unspecified hearing loss, unspecified ear: H91.90

## 2022-02-25 HISTORY — DX: Thoracic aortic ectasia: I77.810

## 2022-02-25 HISTORY — DX: Essential (primary) hypertension: I10

## 2022-02-25 HISTORY — DX: Personal history of other diseases of the digestive system: Z87.19

## 2022-02-25 HISTORY — DX: Primary osteoarthritis, unspecified site: M19.91

## 2022-02-25 HISTORY — DX: Personal history of colonic polyps: Z86.010

## 2022-02-25 HISTORY — DX: Constipation, unspecified: K59.00

## 2022-02-25 HISTORY — DX: Obesity, unspecified: E66.9

## 2022-02-25 LAB — CBC
HCT: 42.4 % (ref 36.0–46.0)
Hemoglobin: 14 g/dL (ref 12.0–15.0)
MCH: 31.4 pg (ref 26.0–34.0)
MCHC: 33 g/dL (ref 30.0–36.0)
MCV: 95.1 fL (ref 80.0–100.0)
Platelets: 247 10*3/uL (ref 150–400)
RBC: 4.46 MIL/uL (ref 3.87–5.11)
RDW: 11.9 % (ref 11.5–15.5)
WBC: 4.4 10*3/uL (ref 4.0–10.5)
nRBC: 0 % (ref 0.0–0.2)

## 2022-02-25 LAB — COMPREHENSIVE METABOLIC PANEL
ALT: 19 U/L (ref 0–44)
AST: 31 U/L (ref 15–41)
Albumin: 4.2 g/dL (ref 3.5–5.0)
Alkaline Phosphatase: 44 U/L (ref 38–126)
Anion gap: 7 (ref 5–15)
BUN: 20 mg/dL (ref 8–23)
CO2: 28 mmol/L (ref 22–32)
Calcium: 9.3 mg/dL (ref 8.9–10.3)
Chloride: 103 mmol/L (ref 98–111)
Creatinine, Ser: 0.85 mg/dL (ref 0.44–1.00)
GFR, Estimated: >60 mL/min (ref >60)
Glucose, Bld: 106 mg/dL — ABNORMAL HIGH (ref 70–99)
Potassium: 4.2 mmol/L (ref 3.5–5.1)
Sodium: 138 mmol/L (ref 135–145)
Total Bilirubin: 0.6 mg/dL (ref 0.3–1.2)
Total Protein: 6.9 g/dL (ref 6.5–8.1)

## 2022-02-25 NOTE — Progress Notes (Signed)
PCP: Dr. Jillyn Ledger, NP [name]  Cardiologist: Patwardhan  EKG: 04/08/2021 CXR: 03/27/2021 ECHO: 11/06/2021 Stress Test: denies Cardiac Cath: denies  ERAS: pre-surgery Ensure drink provided.  Patient reports testing positive for flu one month ago, has fully recovered and denies any further symptoms.   Patient denies shortness of breath, fever, cough, and chest pain at PAT appointment.  Patient verbalized understanding of instructions provided today at the PAT appointment.  Patient asked to review instructions at home and day of surgery.   Chart forwarded to Anesthesia

## 2022-02-26 ENCOUNTER — Encounter (HOSPITAL_COMMUNITY): Payer: Self-pay

## 2022-02-26 NOTE — Progress Notes (Signed)
Anesthesia Chart Review:  Case: [id number] Date/Time: 03/03/22 0715   Procedure: XI ROBOTIC ASSISTED HIATAL HERNIA REPAIR WITH MESH AND FUNDOPLICATION   Anesthesia type: General   Pre-op diagnosis: HIATAL HERNIA GERD   Location: [facility] OR ROOM 10 / [place] [facility]   Surgeons: Ralene Ok, MD       DISCUSSION: Patient is a 77 year old female scheduled for the above procedure.  History includes never smoker, HTN, mild cardiomegaly, ascending aorta dilatation (mild, 38 mm), pulmonary hypertension (mild, PASP 38 mmHg->30 mmHg 11/06/21 echo), valvular regurgitation (mild-moderate MR/PR, moderate TR 11/06/21), atrial tachycardia (11/2021 monitor; biatrial enlargement L>R 11/06/21 echo), hypercholesterolemia, multinodular goiter, iron deficiency anemia, hiatal hernia, liver lesion (possible liver cyst 11/06/21 echo), tremor, scoliosis, hearing loss (bone anchored hearing aid implant, right 10/10/14), skin cancer, osteoarthritis, chronic venous insufficiency.  Last visit with cardiologist Dr. Virgina Jock was on 11/12/21 for follow-up abnormal echocardiogram with normal LVF, EF 61%, severely dilated LA, mildly dilated RA, mild-moderate valvular regurgitation (MR/TR/PR), PASP 3 mmHg. She had cardiac monitoring in March and October 2023 looking for afib given biatrial enlargement. No afib noted, but she did have atrial tachycardia. She was fairly active with regular walking. No chest pain or SOB at baseline. Was being evaluated for large hiatal hernia. She was started on metoprolol He encouraged her to use a Smartwatch for home monitoring of cardiac rhythm and advised 6 month follow-up.   Anesthesia team to evaluate on the day of surgery.   VS: BP (!) 143/71   Pulse 73   Temp 36.4 C   Resp 16   Ht '5\' 4"'$  (1.626 m)   Wt 83.5 kg   SpO2 98%   BMI 31.58 kg/m   PROVIDERS: Kristen Loader, FNP is PCP  Vernell Leep, MD is cardiologist    LABS: Labs reviewed: Acceptable for surgery. (all labs ordered are listed,  but only abnormal results are displayed)  Labs Reviewed  COMPREHENSIVE METABOLIC PANEL - Abnormal; Notable for the following components:      Result Value   Glucose, Bld 106 (*)    All other components within normal limits  CBC     IMAGES: Esophogram 12/22/21: IMPRESSION: 1. Fixed large hiatal hernia (approximately 75% of the stomach is intrathoracic). No gastroesophageal reflux elicited. 2. Prominent extrinsic mass-effect on the thoracic esophagus by the large hiatal hernia, with a near horizontal course of a portion of the mid thoracic esophagus. Transient delay in passage of the swallowed barium tablet in the horizontal mid portion of the thoracic esophagus, with eventual pill clearance into the stomach with multiple barium swallows. 3. Evidence of mild reflux esophagitis. No esophageal mass or definite stricture. 4. Moderate esophageal dysmotility, characteristic of chronic reflux related dysmotility.    EKG: EKG 04/08/2021: Sinus rhythm 72 bpm Short PR interval Old anteroseptal infarct Nonspecific ST depression   CV: Mobile cardiac telemetry 12 days 11/12/2021 - 11/24/2021: Dominant rhythm: Sinus. HR 50-119 bpm. Avg HR 74 bpm, in sinus rhythm. 63 episodes of probable atrial tachycardia, fastest at 171 bpm for 4 beats, longest for 15 beats at 133 bpm. <1% isolated SVE, couplet/triplets. 1 episode of VT or atrial tachycardia with aberrant conduction, fastest at 167 bpm for 9.8 secs. <1% isolated VE, couplet/triplets. No atrial fibrillation/atrial flutter/high grade AV block, sinus pause >3sec noted. 5 patient triggered events, correlated with SVE/VE.    Echocardiogram 11/06/2021:  Left ventricle cavity is normal in size and wall thickness. Normal global  wall motion. Normal LV systolic function with EF 61%. Indeterminate  diastolic filling pattern.  Left atrial cavity is severely dilated.  Right atrial cavity is mildly dilated.  Structurally normal mitral valve.   Mild to moderate mitral regurgitation.  Structurally normal tricuspid valve.  Moderate tricuspid regurgitation.  Estimated pulmonary artery systolic pressure 30 mmHg.  Mild to moderate pulmonic regurgitation.  Echogenic structure in liver parenchyma, possible a cyst.  Personally reviewed and compared with previous study on 04/04/2021. No  significant change noted.   Past Medical History:  Diagnosis Date   Anemia    Ascending aorta dilatation (HCC)    Atrial dilatation    Bartholin cyst    Compression fracture    lumbar    Constipation    Disorder of bone density and structure, unspecified    Fatty liver    Hearing loss    History of colon polyps    History of hiatal hernia    History of SCC (squamous cell carcinoma) of skin    Hypertension    Liver lesion    Mild cardiomegaly    Mitral regurgitation    Multinodular goiter    Obesity    Other iron deficiency anemia    Primary osteoarthritis    Pulmonary hypertension (HCC)    Pure hypercholesterolemia    Scoliosis    Sensory - neural hearing loss    right   Tremor    Tricuspid regurgitation    Vitamin D deficiency     Past Surgical History:  Procedure Laterality Date   APPENDECTOMY     BONE ANCHORED HEARING AID IMPLANT Right 10/10/2014   Procedure: BONE ANCHORED HEARING AID (BAHA) IMPLANT TEMPORAL BONE RIGHT EAR;  Surgeon: Vicie Mutters, MD;  Location: [place];  Service: ENT;  Laterality: Right;   BREAST REDUCTION SURGERY     CARPAL TUNNEL RELEASE Right    CATARACT EXTRACTION Bilateral    ROTATOR CUFF REPAIR Right    STAPEDECTOMY Bilateral    has metal implants (1 in each ear) - pt cannot have a MRI of her head/brain   UMBILICAL HERNIA REPAIR      MEDICATIONS:  acetaminophen (TYLENOL) 500 MG tablet   B Complex-C (B-COMPLEX WITH VITAMIN C) tablet   Brimonidine Tartrate (LUMIFY) 0.025 % SOLN   carboxymethylcellulose (REFRESH PLUS) 0.5 % SOLN   cetirizine (ZYRTEC) 10 MG tablet   Cholecalciferol  (VITAMIN D-3 PO)   COLLAGEN PO   Cyanocobalamin (VITAMIN B-12 PO)   ferrous sulfate 325 (65 FE) MG tablet   fluorouracil (EFUDEX) 5 % cream   FOSAMAX 70 MG tablet   Magnesium 250 MG TABS   melatonin 5 MG TABS   metoprolol succinate (TOPROL-XL) 25 MG 24 hr tablet   METRONIDAZOLE, TOPICAL, 0.75 % LOTN   Multiple Vitamin (MULTIVITAMIN WITH MINERALS) TABS tablet   Multiple Vitamins-Minerals (PRESERVISION AREDS 2 PO)   omeprazole (PRILOSEC) 20 MG capsule   Probiotic Product (MISC INTESTINAL FLORA REGULAT) CAPS   rosuvastatin (CRESTOR) 10 MG tablet   tretinoin (RETIN-A) 0.05 % cream   TURMERIC PO   No current facility-administered medications for this encounter.    Myra Gianotti, PA-C Surgical Short Stay/Anesthesiology [facility] Phone [phone] [facility] Phone [phone] 02/26/2022 3:35 PM

## 2022-02-26 NOTE — Anesthesia Preprocedure Evaluation (Addendum)
Anesthesia Evaluation  Patient identified by MRN, date of birth, ID band Patient awake    Reviewed: Allergy & Precautions, H&P , NPO status , Patient's Chart, lab work & pertinent test results  Airway Mallampati: II  TM Distance: >3 FB Neck ROM: Full    Dental no notable dental hx. (+) Teeth Intact, Dental Advisory Given   Pulmonary neg pulmonary ROS   Pulmonary exam normal breath sounds clear to auscultation       Cardiovascular Exercise Tolerance: Good hypertension, Pt. on medications and Pt. on home beta blockers + dysrhythmias + Valvular Problems/Murmurs MR  Rhythm:Regular Rate:Normal     Neuro/Psych negative neurological ROS  negative psych ROS   GI/Hepatic Neg liver ROS, hiatal hernia,GERD  Medicated,,  Endo/Other  negative endocrine ROS    Renal/GU negative Renal ROS  negative genitourinary   Musculoskeletal  (+) Arthritis , Osteoarthritis,    Abdominal   Peds  Hematology  (+) Blood dyscrasia, anemia   Anesthesia Other Findings   Reproductive/Obstetrics negative OB ROS                             Anesthesia Physical Anesthesia Plan  ASA: 2  Anesthesia Plan: General   Post-op Pain Management: Ofirmev IV (intra-op)*   Induction: Intravenous  PONV Risk Score and Plan: 4 or greater and Ondansetron, Dexamethasone and Treatment may vary due to age or medical condition  Airway Management Planned: Oral ETT  Additional Equipment:   Intra-op Plan:   Post-operative Plan: Extubation in OR  Informed Consent: I have reviewed the patients History and Physical, chart, labs and discussed the procedure including the risks, benefits and alternatives for the proposed anesthesia with the patient or authorized representative who has indicated his/her understanding and acceptance.     Dental advisory given  Plan Discussed with: CRNA  Anesthesia Plan Comments: (PAT note written  02/26/2022 by Myra Gianotti, PA-C.  )       Anesthesia Quick Evaluation

## 2022-02-26 NOTE — Progress Notes (Signed)
Pt called today wanting to make sure that a note be put in her chart that she has metal implants in her ears and cannot have an MRI on her head/brain. I noted that on her surgical history of the Stapedectomy. She also requested that I update her PCP in Epic to Jillyn Ledger, NP, which I did.

## 2022-02-27 DIAGNOSIS — H906 Mixed conductive and sensorineural hearing loss, bilateral: Secondary | ICD-10-CM | POA: Diagnosis not present

## 2022-03-02 NOTE — H&P (Signed)
Chief Complaint: Follow-up       History of Present Illness: [name] is a 77 y.o. female who is seen today as an office consultation at the request of Dr. Sabra Heck for evaluation of Follow-up .   Patient is a 77 year old female, comes in for follow-up for her hiatal hernia. Patient states that she has had some increased symptoms.  States she has had more dysphagia as well as some dry cough/clearing her throat recently.  Patient recently went esophagram.  She did show a large right hernia with approximate 75% of her stomach up in the chest cavity.  The results are below.   Patient is interested in discussing surgery and what would need to be done prior to surgery.   She has had a previous CT scan.  This did show a small 3.5 cm wide hiatal hernia.   FLUOROSCOPY: Radiation Exposure Index (as provided by the fluoroscopic device): 21 mGy Kerma   COMPARISON:  06/13/2021 chest CT.   FINDINGS: Normal oral and pharyngeal phases of swallowing, with no laryngeal penetration or tracheobronchial aspiration. No significant barium retention in the pharynx. No evidence of free atrial mass, stricture or diverticulum. No significant cricopharyngeus muscle dysfunction.   Fixed large hiatal hernia (approximately 75% of the stomach is intrathoracic). Normal gastric emptying. No gastroesophageal reflux elicited despite provocative maneuvers including water siphon test and Valsalva maneuver. There is prominent extrinsic mass-effect on the thoracic esophagus by the large hiatal hernia, with a near horizontal course of a portion of the midthoracic esophagus. Moderate esophageal dysmotility, characterized by intermittent weakening of primary peristalsis throughout the thoracic esophagus. The swallowed barium tablet was transiently delayed in the horizontal midportion of the thoracic esophagus, with eventual clearance of the tablet into the stomach with multiple barium swallows. No discrete  esophageal mass or definite stricture. Mild granularity of the thoracic esophageal mucosa suggest mild reflux esophagitis.   IMPRESSION: 1. Fixed large hiatal hernia (approximately 75% of the stomach is intrathoracic). No gastroesophageal reflux elicited. 2. Prominent extrinsic mass-effect on the thoracic esophagus by the large hiatal hernia, with a near horizontal course of a portion of the mid thoracic esophagus. Transient delay in passage of the swallowed barium tablet in the horizontal mid portion of the thoracic esophagus, with eventual pill clearance into the stomach with multiple barium swallows. 3. Evidence of mild reflux esophagitis. No esophageal mass or definite stricture. 4. Moderate esophageal dysmotility, characteristic of chronic reflux related dysmotility.     Review of Systems: A complete review of systems was obtained from the patient.  I have reviewed this information and discussed as appropriate with the patient.  See HPI as well for other ROS.   Review of Systems  Constitutional: Negative.   HENT: Negative.    Eyes: Negative.   Respiratory: Negative.    Cardiovascular: Negative.   Gastrointestinal:  Positive for heartburn.  Genitourinary: Negative.   Musculoskeletal: Negative.   Skin: Negative.   Neurological: Negative.   Endo/Heme/Allergies: Negative.   Psychiatric/Behavioral: Negative.          Medical History: Past Medical History Past Medical History: Diagnosis        Date            Anemia                         GERD (gastroesophageal reflux disease)                   History of  cancer                 There is no problem list on file for this patient.     Past Surgical History Past Surgical History: Procedure       Laterality         Date            Bone Anchored Hearing Aid Implant Temporal Bone - Right Ear                 10/10/2014             Dr. Fredirick Maudlin            APPENDECTOMY                               Carpal Tunnel Right  Wrist Surgery                              LAPAROSCOPIC TUBAL LIGATION                          REDUCTION MAMMAPLASTY                                  Rotator Cuff Right Shoulder Surgery                           UMBILICAL HERNIA REPAIR                               Allergies No Known Allergies     Current Outpatient Medications on File Prior to Visit Medication       Sig       Dispense         Refill            alendronate (FOSAMAX) 70 MG tablet          1 tablet 30 minutes before the first food, beverage or medicine of the day with plain water                                 metoprolol succinate (TOPROL-XL) 25 MG XL tablet           Take 25 mg by mouth once daily                                metroNIDAZOLE (METROLOTION) 0.75 % topical lotion     metronidazole 0.75 % lotion  APPLY TOPICALLY TO FACE EVERY DAY AS DIRECTED                                   omeprazole (PRILOSEC) 40 MG DR capsule           omeprazole 40 mg capsule,delayed release  TAKE 1 CAPSULE BY MOUTH TWICE DAILY 30 MINUTES BEFORE THE MORNING AND EVENING MEAL  predniSONE (DELTASONE) 10 MG tablet    prednisone 10 mg tablet  take 1 tab three times a day for 2 days, 1 tab twice a day for 5 days, 1 tab daily till finished                                    rosuvastatin (CRESTOR) 10 MG tablet         Take 1 tablet by mouth once daily                      No current facility-administered medications on file prior to visit.     Family History Family History Problem           Relation           Age of Onset            Coronary Artery Disease (Blocked arteries around heart)     Father               Skin cancer     Sister                Breast cancer  Sister                Skin cancer     Brother                    Social History   Tobacco Use Smoking Status           Never Smokeless Tobacco   Never     Social History Social History     Socioeconomic History            Marital  status:  Married Tobacco Use            Smoking status:          Never            Smokeless tobacco:    Never Substance and Sexual Activity            Alcohol use:    Yes                         Comment: Glass of wine occasionally            Drug use:        Never       Objective:     There were no vitals filed for this visit.  There is no height or weight on file to calculate BMI.   Physical Exam Constitutional:      Appearance: Normal appearance.  HENT:     Head: Normocephalic and atraumatic.     Mouth/Throat:     Mouth: Mucous membranes are moist.     Pharynx: Oropharynx is clear.  Eyes:     General: No scleral icterus.    Pupils: Pupils are equal, round, and reactive to light.  Cardiovascular:     Rate and Rhythm: Normal rate and regular rhythm.     Pulses: Normal pulses.     Heart sounds: No murmur heard.    No friction rub. No gallop.  Pulmonary:     Effort: Pulmonary effort is normal. No respiratory distress.     Breath sounds: Normal breath sounds. No stridor.  Abdominal:     General: Abdomen is flat.  Musculoskeletal:  General: No swelling.  Skin:    General: Skin is warm.  Neurological:     General: No focal deficit present.     Mental Status: She is alert and oriented to person, place, and time. Mental status is at baseline.  Psychiatric:        Mood and Affect: Mood normal.        Thought Content: Thought content normal.        Judgment: Judgment normal.            Assessment and Plan: There are no diagnoses linked to this encounter.  [name] is a 77 y.o. female    1.          We will proceed to the OR for a robotic hiatal hernia repair with mesh and toupet fundoplication 2.         All risks and benefits were discussed with the patient, to generally include infection, bleeding, damage to surrounding structures, possible pneumothorax, and recurrence. Alternatives were offered and described.  All questions were answered and the  patient voiced understanding of the procedure and wishes to proceed at this point.

## 2022-03-03 ENCOUNTER — Other Ambulatory Visit: Payer: Self-pay

## 2022-03-03 ENCOUNTER — Encounter (HOSPITAL_COMMUNITY)
Admission: RE | Disposition: A | Discharge status: Home / Self Care | Payer: Self-pay | Source: Home / Self Care | Attending: General Surgery

## 2022-03-03 ENCOUNTER — Encounter (HOSPITAL_COMMUNITY): Payer: Self-pay | Admitting: General Surgery

## 2022-03-03 ENCOUNTER — Ambulatory Visit (HOSPITAL_COMMUNITY): Payer: Medicare Other | Admitting: Vascular Surgery

## 2022-03-03 ENCOUNTER — Ambulatory Visit (HOSPITAL_BASED_OUTPATIENT_CLINIC_OR_DEPARTMENT_OTHER): Payer: Medicare Other | Admitting: Anesthesiology

## 2022-03-03 ENCOUNTER — Inpatient Hospital Stay (HOSPITAL_COMMUNITY)
Admission: RE | Admit: 2022-03-03 | Discharge: 2022-03-09 | DRG: 326 | Disposition: A | Discharge status: Home / Self Care | Payer: Medicare Other | Attending: General Surgery | Admitting: General Surgery

## 2022-03-03 DIAGNOSIS — I10 Essential (primary) hypertension: Secondary | ICD-10-CM | POA: Diagnosis not present

## 2022-03-03 DIAGNOSIS — E7800 Pure hypercholesterolemia, unspecified: Secondary | ICD-10-CM | POA: Diagnosis present

## 2022-03-03 DIAGNOSIS — I82409 Acute embolism and thrombosis of unspecified deep veins of unspecified lower extremity: Secondary | ICD-10-CM | POA: Insufficient documentation

## 2022-03-03 DIAGNOSIS — Z808 Family history of malignant neoplasm of other organs or systems: Secondary | ICD-10-CM | POA: Diagnosis not present

## 2022-03-03 DIAGNOSIS — R0603 Acute respiratory distress: Secondary | ICD-10-CM | POA: Diagnosis present

## 2022-03-03 DIAGNOSIS — Z803 Family history of malignant neoplasm of breast: Secondary | ICD-10-CM

## 2022-03-03 DIAGNOSIS — Z8249 Family history of ischemic heart disease and other diseases of the circulatory system: Secondary | ICD-10-CM | POA: Diagnosis not present

## 2022-03-03 DIAGNOSIS — J9601 Acute respiratory failure with hypoxia: Secondary | ICD-10-CM | POA: Insufficient documentation

## 2022-03-03 DIAGNOSIS — I82461 Acute embolism and thrombosis of right calf muscular vein: Secondary | ICD-10-CM | POA: Diagnosis not present

## 2022-03-03 DIAGNOSIS — K219 Gastro-esophageal reflux disease without esophagitis: Secondary | ICD-10-CM | POA: Diagnosis not present

## 2022-03-03 DIAGNOSIS — E669 Obesity, unspecified: Secondary | ICD-10-CM | POA: Diagnosis not present

## 2022-03-03 DIAGNOSIS — Z6830 Body mass index (BMI) 30.0-30.9, adult: Secondary | ICD-10-CM | POA: Diagnosis not present

## 2022-03-03 DIAGNOSIS — I081 Rheumatic disorders of both mitral and tricuspid valves: Secondary | ICD-10-CM | POA: Diagnosis present

## 2022-03-03 DIAGNOSIS — K224 Dyskinesia of esophagus: Secondary | ICD-10-CM | POA: Diagnosis not present

## 2022-03-03 DIAGNOSIS — I089 Rheumatic multiple valve disease, unspecified: Secondary | ICD-10-CM | POA: Diagnosis not present

## 2022-03-03 DIAGNOSIS — K449 Diaphragmatic hernia without obstruction or gangrene: Secondary | ICD-10-CM

## 2022-03-03 DIAGNOSIS — Z7983 Long term (current) use of bisphosphonates: Secondary | ICD-10-CM

## 2022-03-03 DIAGNOSIS — I824Y1 Acute embolism and thrombosis of unspecified deep veins of right proximal lower extremity: Secondary | ICD-10-CM | POA: Diagnosis not present

## 2022-03-03 DIAGNOSIS — I272 Pulmonary hypertension, unspecified: Secondary | ICD-10-CM | POA: Diagnosis not present

## 2022-03-03 DIAGNOSIS — I714 Abdominal aortic aneurysm, without rupture, unspecified: Secondary | ICD-10-CM | POA: Diagnosis present

## 2022-03-03 DIAGNOSIS — I2699 Other pulmonary embolism without acute cor pulmonale: Secondary | ICD-10-CM | POA: Insufficient documentation

## 2022-03-03 DIAGNOSIS — Z79899 Other long term (current) drug therapy: Secondary | ICD-10-CM

## 2022-03-03 DIAGNOSIS — Z85828 Personal history of other malignant neoplasm of skin: Secondary | ICD-10-CM

## 2022-03-03 DIAGNOSIS — I82401 Acute embolism and thrombosis of unspecified deep veins of right lower extremity: Secondary | ICD-10-CM | POA: Diagnosis not present

## 2022-03-03 DIAGNOSIS — K2100 Gastro-esophageal reflux disease with esophagitis, without bleeding: Secondary | ICD-10-CM | POA: Diagnosis present

## 2022-03-03 DIAGNOSIS — J9811 Atelectasis: Secondary | ICD-10-CM | POA: Diagnosis not present

## 2022-03-03 DIAGNOSIS — Z9889 Other specified postprocedural states: Secondary | ICD-10-CM | POA: Diagnosis present

## 2022-03-03 HISTORY — PX: XI ROBOTIC ASSISTED HIATAL HERNIA REPAIR: SHX6889

## 2022-03-03 SURGERY — REPAIR, HERNIA, HIATAL, ROBOT-ASSISTED
Anesthesia: General

## 2022-03-03 MED ORDER — FENTANYL CITRATE (PF) 250 MCG/5ML IJ SOLN
INTRAMUSCULAR | Status: AC
  Filled 2022-03-03: qty 5

## 2022-03-03 MED ORDER — HYDROCODONE-ACETAMINOPHEN 7.5-325 MG/15ML PO SOLN
10.0000 mL | ORAL | Status: DC | PRN
  Administered 2022-03-03 – 2022-03-08 (×14): 10 mL via ORAL
  Filled 2022-03-03 (×14): qty 15

## 2022-03-03 MED ORDER — CHLORHEXIDINE GLUCONATE CLOTH 2 % EX PADS: 6.0000 | MEDICATED_PAD | Freq: Once | CUTANEOUS | Status: DC

## 2022-03-03 MED ORDER — DEXAMETHASONE SODIUM PHOSPHATE 10 MG/ML IJ SOLN
INTRAMUSCULAR | Status: DC | PRN
  Administered 2022-03-03: 4 mg via INTRAVENOUS

## 2022-03-03 MED ORDER — SODIUM CHLORIDE 0.9% FLUSH
INTRAVENOUS | Status: DC | PRN
  Administered 2022-03-03: 20 mL via TOPICAL

## 2022-03-03 MED ORDER — LACTATED RINGERS IV SOLN: INTRAVENOUS | Status: DC

## 2022-03-03 MED ORDER — BUPIVACAINE HCL (PF) 0.25 % IJ SOLN
INTRAMUSCULAR | Status: AC
  Filled 2022-03-03: qty 30

## 2022-03-03 MED ORDER — PROPOFOL 10 MG/ML IV BOLUS
INTRAVENOUS | Status: DC | PRN
  Administered 2022-03-03: 100 mg via INTRAVENOUS

## 2022-03-03 MED ORDER — CEFAZOLIN SODIUM-DEXTROSE 2-4 GM/100ML-% IV SOLN
2.0000 g | INTRAVENOUS | Status: AC
  Administered 2022-03-03: 2 g via INTRAVENOUS
  Filled 2022-03-03: qty 100

## 2022-03-03 MED ORDER — PHENYLEPHRINE 80 MCG/ML (10ML) SYRINGE FOR IV PUSH (FOR BLOOD PRESSURE SUPPORT)
PREFILLED_SYRINGE | INTRAVENOUS | Status: DC | PRN
  Administered 2022-03-03: 80 ug via INTRAVENOUS

## 2022-03-03 MED ORDER — PHENYLEPHRINE 80 MCG/ML (10ML) SYRINGE FOR IV PUSH (FOR BLOOD PRESSURE SUPPORT)
PREFILLED_SYRINGE | INTRAVENOUS | Status: AC
  Filled 2022-03-03: qty 10

## 2022-03-03 MED ORDER — DEXMEDETOMIDINE HCL IN NACL 80 MCG/20ML IV SOLN
INTRAVENOUS | Status: AC
  Filled 2022-03-03: qty 20

## 2022-03-03 MED ORDER — HEMOSTATIC AGENTS (NO CHARGE) OPTIME
TOPICAL | Status: DC | PRN
  Administered 2022-03-03: 1 via TOPICAL

## 2022-03-03 MED ORDER — DEXTROSE-NACL 5-0.9 % IV SOLN: INTRAVENOUS | Status: DC

## 2022-03-03 MED ORDER — SUGAMMADEX SODIUM 200 MG/2ML IV SOLN
INTRAVENOUS | Status: DC | PRN
  Administered 2022-03-03: 200 mg via INTRAVENOUS

## 2022-03-03 MED ORDER — ENSURE PRE-SURGERY PO LIQD: 296.0000 mL | Freq: Once | ORAL | Status: DC

## 2022-03-03 MED ORDER — POLYVINYL ALCOHOL 1.4 % OP SOLN: 1.0000 [drp] | OPHTHALMIC | Status: DC | PRN

## 2022-03-03 MED ORDER — METOPROLOL SUCCINATE ER 25 MG PO TB24
25.0000 mg | ORAL_TABLET | Freq: Every day | ORAL | Status: DC
  Administered 2022-03-03: 25 mg via ORAL
  Filled 2022-03-03: qty 1

## 2022-03-03 MED ORDER — SUCCINYLCHOLINE CHLORIDE 200 MG/10ML IV SOSY
PREFILLED_SYRINGE | INTRAVENOUS | Status: DC | PRN
  Administered 2022-03-03: 100 mg via INTRAVENOUS

## 2022-03-03 MED ORDER — ACETAMINOPHEN 500 MG PO TABS
1000.0000 mg | ORAL_TABLET | ORAL | Status: AC
  Administered 2022-03-03: 1000 mg via ORAL
  Filled 2022-03-03: qty 2

## 2022-03-03 MED ORDER — PROPOFOL 10 MG/ML IV BOLUS
INTRAVENOUS | Status: AC
  Filled 2022-03-03: qty 20

## 2022-03-03 MED ORDER — PHENYLEPHRINE HCL-NACL 20-0.9 MG/250ML-% IV SOLN
INTRAVENOUS | Status: DC | PRN
  Administered 2022-03-03: 50 ug/min via INTRAVENOUS

## 2022-03-03 MED ORDER — ROCURONIUM BROMIDE 10 MG/ML (PF) SYRINGE
PREFILLED_SYRINGE | INTRAVENOUS | Status: AC
  Filled 2022-03-03: qty 10

## 2022-03-03 MED ORDER — EPHEDRINE SULFATE-NACL 50-0.9 MG/10ML-% IV SOSY
PREFILLED_SYRINGE | INTRAVENOUS | Status: DC | PRN
  Administered 2022-03-03 (×2): 10 mg via INTRAVENOUS

## 2022-03-03 MED ORDER — 0.9 % SODIUM CHLORIDE (POUR BTL) OPTIME
TOPICAL | Status: DC | PRN
  Administered 2022-03-03: 1000 mL

## 2022-03-03 MED ORDER — ONDANSETRON 4 MG PO TBDP: 4.0000 mg | ORAL_TABLET | Freq: Four times a day (QID) | ORAL | Status: DC | PRN

## 2022-03-03 MED ORDER — ORAL CARE MOUTH RINSE: 15.0000 mL | Freq: Once | OROMUCOSAL | Status: AC

## 2022-03-03 MED ORDER — FENTANYL CITRATE (PF) 100 MCG/2ML IJ SOLN
INTRAMUSCULAR | Status: AC
  Filled 2022-03-03: qty 2

## 2022-03-03 MED ORDER — DEXMEDETOMIDINE HCL IN NACL 80 MCG/20ML IV SOLN
INTRAVENOUS | Status: DC | PRN
  Administered 2022-03-03: 4 ug via INTRAVENOUS
  Administered 2022-03-03: 8 ug via INTRAVENOUS

## 2022-03-03 MED ORDER — LIDOCAINE 2% (20 MG/ML) 5 ML SYRINGE
INTRAMUSCULAR | Status: DC | PRN
  Administered 2022-03-03: 60 mg via INTRAVENOUS

## 2022-03-03 MED ORDER — FENTANYL CITRATE (PF) 100 MCG/2ML IJ SOLN
25.0000 ug | INTRAMUSCULAR | Status: DC | PRN
  Administered 2022-03-03: 25 ug via INTRAVENOUS

## 2022-03-03 MED ORDER — SUCCINYLCHOLINE CHLORIDE 200 MG/10ML IV SOSY
PREFILLED_SYRINGE | INTRAVENOUS | Status: AC
  Filled 2022-03-03: qty 10

## 2022-03-03 MED ORDER — CARBOXYMETHYLCELLULOSE SODIUM 0.5 % OP SOLN: 1.0000 [drp] | Freq: Three times a day (TID) | OPHTHALMIC | Status: DC | PRN

## 2022-03-03 MED ORDER — FENTANYL CITRATE (PF) 250 MCG/5ML IJ SOLN
INTRAMUSCULAR | Status: DC | PRN
  Administered 2022-03-03: 50 ug via INTRAVENOUS
  Administered 2022-03-03 (×2): 100 ug via INTRAVENOUS

## 2022-03-03 MED ORDER — ONDANSETRON HCL 4 MG/2ML IJ SOLN
INTRAMUSCULAR | Status: DC | PRN
  Administered 2022-03-03: 4 mg via INTRAVENOUS

## 2022-03-03 MED ORDER — ONDANSETRON HCL 4 MG/2ML IJ SOLN
INTRAMUSCULAR | Status: AC
  Filled 2022-03-03: qty 2

## 2022-03-03 MED ORDER — ACETAMINOPHEN 10 MG/ML IV SOLN
INTRAVENOUS | Status: AC
  Filled 2022-03-03: qty 100

## 2022-03-03 MED ORDER — ONDANSETRON HCL 4 MG/2ML IJ SOLN
4.0000 mg | Freq: Four times a day (QID) | INTRAMUSCULAR | Status: DC | PRN
  Administered 2022-03-03 – 2022-03-04 (×3): 4 mg via INTRAVENOUS
  Filled 2022-03-03 (×4): qty 2

## 2022-03-03 MED ORDER — LIDOCAINE 2% (20 MG/ML) 5 ML SYRINGE
INTRAMUSCULAR | Status: AC
  Filled 2022-03-03: qty 5

## 2022-03-03 MED ORDER — BUPIVACAINE HCL 0.25 % IJ SOLN
INTRAMUSCULAR | Status: DC | PRN
  Administered 2022-03-03: 20 mL

## 2022-03-03 MED ORDER — BUPIVACAINE LIPOSOME 1.3 % IJ SUSP
INTRAMUSCULAR | Status: DC | PRN
  Administered 2022-03-03: 15 mL

## 2022-03-03 MED ORDER — CHLORHEXIDINE GLUCONATE 0.12 % MT SOLN
15.0000 mL | Freq: Once | OROMUCOSAL | Status: AC
  Administered 2022-03-03: 15 mL via OROMUCOSAL
  Filled 2022-03-03: qty 15

## 2022-03-03 MED ORDER — HYDROMORPHONE HCL 1 MG/ML IJ SOLN
1.0000 mg | INTRAMUSCULAR | Status: DC | PRN
  Administered 2022-03-03: 2 mg via INTRAVENOUS
  Administered 2022-03-04: 1 mg via INTRAVENOUS
  Administered 2022-03-04: 2 mg via INTRAVENOUS
  Filled 2022-03-03: qty 2
  Filled 2022-03-03: qty 1
  Filled 2022-03-03: qty 2

## 2022-03-03 MED ORDER — ROCURONIUM BROMIDE 10 MG/ML (PF) SYRINGE
PREFILLED_SYRINGE | INTRAVENOUS | Status: DC | PRN
  Administered 2022-03-03 (×2): 20 mg via INTRAVENOUS
  Administered 2022-03-03: 60 mg via INTRAVENOUS

## 2022-03-03 MED ORDER — BUPIVACAINE LIPOSOME 1.3 % IJ SUSP
INTRAMUSCULAR | Status: AC
  Filled 2022-03-03: qty 20

## 2022-03-03 MED ORDER — DEXAMETHASONE SODIUM PHOSPHATE 10 MG/ML IJ SOLN
INTRAMUSCULAR | Status: AC
  Filled 2022-03-03: qty 1

## 2022-03-03 MED ORDER — EPHEDRINE 5 MG/ML INJ
INTRAVENOUS | Status: AC
  Filled 2022-03-03: qty 5

## 2022-03-03 SURGICAL SUPPLY — 64 items
ADH SKN CLS APL DERMABOND .7 (GAUZE/BANDAGES/DRESSINGS) ×1
APL PRP STRL LF DISP 70% ISPRP (MISCELLANEOUS) ×1
CANNULA REDUC XI 12-8 STAPL (CANNULA) ×1
CANNULA REDUCER 12-8 DVNC XI (CANNULA) ×1 IMPLANT
CHLORAPREP W/TINT 26 (MISCELLANEOUS) ×1 IMPLANT
COVER MAYO STAND STRL (DRAPES) ×1 IMPLANT
COVER SURGICAL LIGHT HANDLE (MISCELLANEOUS) ×1 IMPLANT
COVER TIP SHEARS 8 DVNC (MISCELLANEOUS) IMPLANT
COVER TIP SHEARS 8MM DA VINCI (MISCELLANEOUS) ×1
DEFOGGER SCOPE WARMER CLEARIFY (MISCELLANEOUS) ×1 IMPLANT
DERMABOND ADVANCED .7 DNX12 (GAUZE/BANDAGES/DRESSINGS) ×1 IMPLANT
DEVICE TROCAR PUNCTURE CLOSURE (ENDOMECHANICALS) ×1 IMPLANT
DRAPE ARM DVNC X/XI (DISPOSABLE) ×4 IMPLANT
DRAPE CARDIOVASC SPLIT 88X140 (DRAPES) ×1 IMPLANT
DRAPE COLUMN DVNC XI (DISPOSABLE) ×1 IMPLANT
DRAPE DA VINCI XI ARM (DISPOSABLE) ×4
DRAPE DA VINCI XI COLUMN (DISPOSABLE) ×1
DRAPE ORTHO SPLIT 77X108 STRL (DRAPES) ×1
DRAPE SURG ORHT 6 SPLT 77X108 (DRAPES) ×1 IMPLANT
ELECT REM PT RETURN 9FT ADLT (ELECTROSURGICAL) ×1
ELECTRODE REM PT RTRN 9FT ADLT (ELECTROSURGICAL) ×1 IMPLANT
GLOVE BIO SURGEON STRL SZ7.5 (GLOVE) ×2 IMPLANT
GLOVE SURG SYN 7.5  E (GLOVE) ×3
GLOVE SURG SYN 7.5 E (GLOVE) ×3 IMPLANT
GLOVE SURG SYN 7.5 PF PI (GLOVE) ×3 IMPLANT
GOWN STRL REUS W/ TWL LRG LVL3 (GOWN DISPOSABLE) ×1 IMPLANT
GOWN STRL REUS W/ TWL XL LVL3 (GOWN DISPOSABLE) ×2 IMPLANT
GOWN STRL REUS W/TWL 2XL LVL3 (GOWN DISPOSABLE) ×1 IMPLANT
GOWN STRL REUS W/TWL LRG LVL3 (GOWN DISPOSABLE) ×1
GOWN STRL REUS W/TWL XL LVL3 (GOWN DISPOSABLE) ×2
HEMOSTAT SURGICEL 2X14 (HEMOSTASIS) IMPLANT
KIT BASIN OR (CUSTOM PROCEDURE TRAY) ×1 IMPLANT
KIT TURNOVER KIT B (KITS) IMPLANT
MARKER SKIN DUAL TIP RULER LAB (MISCELLANEOUS) ×1 IMPLANT
MESH BIO-A 7X10 SYN MAT (Mesh General) ×1 IMPLANT
NDL 22X1.5 STRL (OR ONLY) (MISCELLANEOUS) ×1 IMPLANT
NDL INSUFFLATION 14GA 120MM (NEEDLE) ×1 IMPLANT
NEEDLE 22X1.5 STRL (OR ONLY) (MISCELLANEOUS) ×1 IMPLANT
NEEDLE INSUFFLATION 14GA 120MM (NEEDLE) ×1 IMPLANT
OBTURATOR OPTICAL STANDARD 8MM (TROCAR) ×1
OBTURATOR OPTICAL STND 8 DVNC (TROCAR) ×1
OBTURATOR OPTICALSTD 8 DVNC (TROCAR) IMPLANT
PENCIL SMOKE EVACUATOR (MISCELLANEOUS) IMPLANT
SCISSORS LAP 5X35 DISP (ENDOMECHANICALS) IMPLANT
SEAL CANN UNIV 5-8 DVNC XI (MISCELLANEOUS) ×3 IMPLANT
SEAL XI 5MM-8MM UNIVERSAL (MISCELLANEOUS) ×3
SEALER VESSEL DA VINCI XI (MISCELLANEOUS) ×1
SEALER VESSEL EXT DVNC XI (MISCELLANEOUS) ×1 IMPLANT
SET IRRIG TUBING LAPAROSCOPIC (IRRIGATION / IRRIGATOR) ×1 IMPLANT
SET TUBE SMOKE EVAC HIGH FLOW (TUBING) ×1 IMPLANT
SPIKE FLUID TRANSFER (MISCELLANEOUS) ×1 IMPLANT
STAPLER CANNULA SEAL DVNC XI (STAPLE) ×1 IMPLANT
STAPLER CANNULA SEAL XI (STAPLE) ×1
STAPLER VISISTAT 35W (STAPLE) IMPLANT
STOPCOCK 4 WAY LG BORE MALE ST (IV SETS) ×1 IMPLANT
SUT ETHIBOND 0 36 GRN (SUTURE) ×2 IMPLANT
SUT ETHIBOND 2 0 SH (SUTURE)
SUT ETHIBOND 2 0 SH 36X2 (SUTURE) IMPLANT
SUT MNCRL AB 4-0 PS2 18 (SUTURE) ×1 IMPLANT
SUT SILK 0 SH 30 (SUTURE) ×1 IMPLANT
SUT VICRYL 0 UR6 27IN ABS (SUTURE) ×1 IMPLANT
SYR 30ML SLIP (SYRINGE) ×1 IMPLANT
TRAY LAPAROSCOPIC MC (CUSTOM PROCEDURE TRAY) ×1 IMPLANT
TROCAR ADV FIXATION 5X100MM (TROCAR) ×1 IMPLANT

## 2022-03-03 NOTE — Progress Notes (Signed)
POST OP CHECK  Pt sleeping, was in pain and just received pain meds.  BP (!) 145/77 (BP Location: Left Arm)   Pulse 79   Temp 97.7 F (36.5 C) (Oral)   Resp 18   Ht '5\' 4"'$  (1.626 m)   Wt 79.8 kg   SpO2 98%   BMI 30.21 kg/m    D/w results and surgery pt's husband and daughter.

## 2022-03-03 NOTE — Discharge Instructions (Signed)
EATING AFTER YOUR ESOPHAGEAL SURGERY (Stomach Fundoplication, Hiatal Hernia repair, Achalasia surgery, etc)  ######################################################################  EAT Start with a pureed / full liquid diet (see below) Gradually transition to a high fiber diet with a fiber supplement over the next month after discharge.    WALK Walk an hour a day.  Control your pain to do that.    CONTROL PAIN Control pain so that you can walk, sleep, tolerate sneezing/coughing, go up/down stairs.  HAVE A BOWEL MOVEMENT DAILY Keep your bowels regular to avoid problems.  OK to try a laxative to override constipation.  OK to use an antidairrheal to slow down diarrhea.  Call if not better after 2 tries  CALL IF YOU HAVE PROBLEMS/CONCERNS Call if you are still struggling despite following these instructions. Call if you have concerns not answered by these instructions  ######################################################################   After your esophageal surgery, expect some sticking with swallowing over the next 1-2 months.    If food sticks when you eat, it is called "dysphagia".  This is due to swelling around your esophagus at the wrap & hiatal diaphragm repair.  It will gradually ease off over the next few months.  To help you through this temporary phase, we start you out on a pureed (blenderized) diet.  Your first meal in the hospital was thin liquids.  You should have been given a pureed diet by the time you left the hospital.  We ask patients to stay on a pureed diet for the first 2-3 weeks to avoid anything getting "stuck" near your recent surgery.  Don't be alarmed if your ability to swallow doesn't progress according to this plan.  Everyone is different and some diets can advance more or less quickly.    It is often helpful to crush your medications or split them as they can sometimes stick, especially the first week or so.   Some BASIC RULES to follow are: Maintain  an upright position whenever eating or drinking. Take small bites - just a teaspoon size bite at a time. Eat slowly.  It may also help to eat only one food at a time. Consider nibbling through smaller, more frequent meals & avoid the urge to eat BIG meals Do not push through feelings of fullness, nausea, or bloatedness Do not mix solid foods and liquids in the same mouthful Try not to "wash foods down" with large gulps of liquids. Avoid carbonated (bubbly/fizzy) drinks.   Avoid foods that make you feel gassy or bloated.  Start with bland foods first.  Wait on trying greasy, fried, or spicy meals until you are tolerating more bland solids well. Understand that it will be hard to burp and belch at first.  This gradually improves with time.  Expect to be more gassy/flatulent/bloated initially.  Walking will help your body manage it better. Consider using medications for bloating that contain simethicone such as  Maalox or Gas-X  Consider crushing her medications, especially smaller pills.  The ability to swallow pills should get easier after a few weeks Eat in a relaxed atmosphere & minimize distractions. Avoid talking while eating.   Do not use straws. Following each meal, sit in an upright position (90 degree angle) for 60 to 90 minutes.  Going for a short walk can help as well If food does stick, don't panic.  Try to relax and let the food pass on its own.  Sipping WARM LIQUID such as strong hot black tea can also help slide it down.  Be gradual in changes & use common sense:  -If you easily tolerating a certain "level" of foods, advance to the next level gradually -If you are having trouble swallowing a particular food, then avoid it.   -If food is sticking when you advance your diet, go back to thinner previous diet (the lower LEVEL) for 1-2 days.  LEVEL 1 = PUREED DIET  Do for the first 2 WEEKS AFTER SURGERY  -Foods in this group are pureed or blenderized to a smooth, mashed  potato-like consistency.  -If necessary, the pureed foods can keep their shape with the addition of a thickening agent.   -Meat should be pureed to a smooth, pasty consistency.  Hot broth or gravy may be added to the pureed meat, approximately 1 oz. of liquid per 3 oz. serving of meat. -CAUTION:  If any foods do not puree into a smooth consistency, swallowing will be more difficult.  (For example, nuts or seeds sometimes do not blend well.)  Hot Foods Cold Foods  Pureed scrambled eggs and cheese Pureed cottage cheese  Baby cereals Thickened juices and nectars  Thinned cooked cereals (no lumps) Thickened milk or eggnog  Pureed [place] toast or pancakes Ensure  Mashed potatoes Ice cream  Pureed parsley, au gratin, scalloped potatoes, candied sweet potatoes Fruit or [place] ice, sherbet  Pureed buttered or alfredo noodles Plain yogurt  Pureed vegetables (no corn or peas) Instant breakfast  Pureed soups and creamed soups Smooth pudding, mousse, custard  Pureed scalloped apples Whipped gelatin  Gravies Sugar, syrup, honey, jelly  Sauces, cheese, tomato, barbecue, white, creamed Cream  Any baby food Creamer  Alcohol in moderation (not beer or champagne) Margarine  Coffee or tea Mayonnaise   Ketchup, mustard   Apple sauce   SAMPLE MENU:  PUREED DIET Breakfast Lunch Dinner  Orange juice, 1/2 cup Cream of wheat, 1/2 cup Pineapple juice, 1/2 cup Pureed [place], barley soup, 3/4 cup Pureed Hawaiian chicken, 3 oz  Scrambled eggs, mashed or blended with cheese, 1/2 cup Tea or coffee, 1 cup  Whole milk, 1 cup  Non-dairy creamer, 2 Tbsp. Mashed potatoes, 1/2 cup Pureed cooled broccoli, 1/2 cup Apple sauce, 1/2 cup Coffee or tea Mashed potatoes, 1/2 cup Pureed spinach, 1/2 cup Frozen yogurt, 1/2 cup Tea or coffee      LEVEL 2 = SOFT DIET  After your first 2 weeks, you can advance to a soft diet.   Keep on this diet until everything goes down easily.  Hot Foods Cold Foods  White fish  Cottage cheese  Stuffed fish Junior baby fruit  Baby food meals Semi thickened juices  Minced soft cooked, scrambled, poached eggs nectars  Souffle & omelets Ripe mashed bananas  Cooked cereals Canned fruit, pineapple sauce, milk  potatoes Milkshake  Buttered or Alfredo noodles Custard  Cooked cooled vegetable Puddings, including tapioca  Sherbet Yogurt  Vegetable soup or alphabet soup Fruit ice, [place] ice  Gravies Whipped gelatin  Sugar, syrup, honey, jelly Junior baby desserts  Sauces:  Cheese, creamed, barbecue, tomato, white Cream  Coffee or tea Margarine   SAMPLE MENU:  LEVEL 2 Breakfast Lunch Dinner  Orange juice, 1/2 cup Oatmeal, 1/2 cup Scrambled eggs with cheese, 1/2 cup Decaffeinated tea, 1 cup Whole milk, 1 cup Non-dairy creamer, 2 Tbsp Pineapple juice, 1/2 cup Minced beef, 3 oz Gravy, 2 Tbsp Mashed potatoes, 1/2 cup Minced fresh broccoli, 1/2 cup Applesauce, 1/2 cup Coffee, 1 cup [place], barley soup, 3/4 cup Minced Hawaiian chicken, 3 oz  Mashed potatoes, 1/2 cup Cooked spinach, 1/2 cup Frozen yogurt, 1/2 cup Non-dairy creamer, 2 Tbsp      LEVEL 3 = CHOPPED DIET  -After all the foods in level 2 (soft diet) are passing through well you should advance up to more chopped foods.  -It is still important to cut these foods into small pieces and eat slowly.  Hot Foods Cold Foods  Poultry Cottage cheese  Chopped Swedish meatballs Yogurt  Meat salads (ground or flaked meat) Milk  Flaked fish (tuna) Milkshakes  Poached or scrambled eggs Soft, cold, dry cereal  Souffles and omelets Fruit juices or nectars  Cooked cereals Chopped canned fruit  Chopped [place] toast or pancakes Canned fruit cocktail  Noodles or pasta (no rice) Pudding, mousse, custard  Cooked vegetables (no frozen peas, corn, or mixed vegetables) Green salad  Canned small sweet peas Ice cream  Creamed soup or vegetable soup Fruit ice, [place] ice  Pureed vegetable soup or alphabet soup  Non-dairy creamer  Ground scalloped apples Margarine  Gravies Mayonnaise  Sauces:  Cheese, creamed, barbecue, tomato, white Ketchup  Coffee or tea Mustard   SAMPLE MENU:  LEVEL 3 Breakfast Lunch Dinner  Orange juice, 1/2 cup Oatmeal, 1/2 cup Scrambled eggs with cheese, 1/2 cup Decaffeinated tea, 1 cup Whole milk, 1 cup Non-dairy creamer, 2 Tbsp Ketchup, 1 Tbsp Margarine, 1 tsp Salt, 1/4 tsp Sugar, 2 tsp Pineapple juice, 1/2 cup Ground beef, 3 oz Gravy, 2 Tbsp Mashed potatoes, 1/2 cup Cooked spinach, 1/2 cup Applesauce, 1/2 cup Decaffeinated coffee Whole milk Non-dairy creamer, 2 Tbsp Margarine, 1 tsp Salt, 1/4 tsp Pureed [place], barley soup, 3/4 cup Barbecue chicken, 3 oz Mashed potatoes, 1/2 cup Ground fresh broccoli, 1/2 cup Frozen yogurt, 1/2 cup Decaffeinated tea, 1 cup Non-dairy creamer, 2 Tbsp Margarine, 1 tsp Salt, 1/4 tsp Sugar, 1 tsp    LEVEL 4:  REGULAR FOODS  -Foods in this group are soft, moist, regularly textured foods.   -This level includes meat and breads, which tend to be the hardest things to swallow.   -Eat very slowly, chew well and continue to avoid carbonated drinks. -most people are at this level in 4-6 weeks  Hot Foods Cold Foods  Baked fish or skinned Soft cheeses - cottage cheese  Souffles and omelets Cream cheese  Eggs Yogurt  Stuffed shells Milk  Spaghetti with meat sauce Milkshakes  Cooked cereal Cold dry cereals (no nuts, dried fruit, coconut)  [place] toast or pancakes Crackers  Buttered toast Fruit juices or nectars  Noodles or pasta (no rice) Canned fruit  Potatoes (all types) Ripe bananas  Soft, cooked vegetables (no corn, lima, or baked beans) Peeled, ripe, fresh fruit  Creamed soups or vegetable soup Cakes (no nuts, dried fruit, coconut)  Canned chicken noodle soup Plain doughnuts  Gravies Ice cream  Bacon dressing Pudding, mousse, custard  Sauces:  Cheese, creamed, barbecue, tomato, white Fruit ice, [place] ice, sherbet   Decaffeinated tea or coffee Whipped gelatin  Pork chops Regular gelatin   Canned fruited gelatin molds   Sugar, syrup, honey, jam, jelly   Cream   Non-dairy   Margarine   Oil   Mayonnaise   Ketchup   Mustard   TROUBLESHOOTING IRREGULAR BOWELS  1) Avoid extremes of bowel movements (no bad constipation/diarrhea)  2) Miralax 17gm mixed in 8oz. water or juice-daily. May use BID as needed.  3) Gas-x,Phazyme, etc. as needed for gas & bloating.  4) Soft,bland diet. No spicy,greasy,fried foods.  5) Prilosec over-the-counter  as needed  6) May hold gluten/wheat products from diet to see if symptoms improve.  7) May try probiotics (Align, Activa, etc) to help calm the bowels down  7) If symptoms become worse call back immediately.    If you have any questions please call our office at [place]: [phone].    Information on my medicine - ELIQUIS (apixaban)  This medication education was reviewed with me or my healthcare representative as part of my discharge preparation.    Why was Eliquis prescribed for you? Eliquis was prescribed to treat blood clots that may have been found in the veins of your legs (deep vein thrombosis) or in your lungs (pulmonary embolism) and to reduce the risk of them occurring again.  What do You need to know about Eliquis ? The starting dose is 10 mg (two 5 mg tablets) taken TWICE daily for the FIRST SEVEN (7) DAYS, then on 03/12/22  the dose is reduced to ONE 5 mg tablet taken TWICE daily.  Eliquis may be taken with or without food.   Try to take the dose about the same time in the morning and in the evening. If you have difficulty swallowing the tablet whole please discuss with your pharmacist how to take the medication safely.  Take Eliquis exactly as prescribed and DO NOT stop taking Eliquis without talking to the doctor who prescribed the medication.  Stopping may increase your risk of developing a new blood clot.  Refill your  prescription before you run out.  After discharge, you should have regular check-up appointments with your healthcare provider that is prescribing your Eliquis.    What do you do if you miss a dose? If a dose of ELIQUIS is not taken at the scheduled time, take it as soon as possible on the same day and twice-daily administration should be resumed. The dose should not be doubled to make up for a missed dose.  Important Safety Information A possible side effect of Eliquis is bleeding. You should call your healthcare provider right away if you experience any of the following: Bleeding from an injury or your nose that does not stop. Unusual colored urine (red or dark brown) or unusual colored stools (red or black). Unusual bruising for unknown reasons. A serious fall or if you hit your head (even if there is no bleeding).  Some medicines may interact with Eliquis and might increase your risk of bleeding or clotting while on Eliquis. To help avoid this, consult your healthcare provider or pharmacist prior to using any new prescription or non-prescription medications, including herbals, vitamins, non-steroidal anti-inflammatory drugs (NSAIDs) and supplements.  This website has more information on Eliquis (apixaban): http://[email or web address].com/eliquis/home

## 2022-03-03 NOTE — Op Note (Signed)
03/03/2022  9:33 AM  PATIENT:  [name]  77 y.o. female  PRE-OPERATIVE DIAGNOSIS:  HIATAL HERNIA, GERD  POST-OPERATIVE DIAGNOSIS:  HIATAL HERNIA, GERD  PROCEDURE:  Procedure(s): XI ROBOTIC ASSISTED HIATAL HERNIA REPAIR WITH MESH AND TOUPET FUNDOPLICATION (N/A)  SURGEON:  Surgeon(s) and Role:    * Ralene Ok, MD - Primary   ASSISTANTS: Pryor Curia, RNFA   ANESTHESIA:   local and general  EBL:  50 mL   BLOOD ADMINISTERED:none  DRAINS: none   LOCAL MEDICATIONS USED:  BUPIVICAINE  and OTHER exparel  SPECIMEN:  No Specimen  DISPOSITION OF SPECIMEN:  N/A  COUNTS:  YES  TOURNIQUET:  * No tourniquets in log *  DICTATION: .Dragon Dictation The patient was taken back to the operating room and placed in the supine position with bilateral SCDs in place. The patient was prepped and draped in the usual sterile fashion. After appropriate antibiotics were confirmed a timeout was called and all facts were verified.   A Veress needle technique was used to insufflate the abdomen to 15 mm of mercury the paramedian stab incision. Subsequent to this an 8 mm trocar was introduced as was a 8 millimeter camera. At this time the subsequent robotic trochars x3, were then placed adjacent to this trocar approximately 8-10 cm away. Each trocar was inserted under direct visualization, there were total of 4 trochars. A 62m trocar was placed in the midclavicular line.  A 0vicryl was placed to help with closure at the end of the case.The assistant trocar was then placed in the right lower quadrant under direct visualization. The Nathanson retractor was then visualized inserted into the abdomen and the incision just to the left of the falciform ligament. This was then placed to retract the liver appropriately. At this time the patient was positioned in reverse Trendelenburg.   At this time the robot patient cart was brought to the bedside and placed in good position and the arms were docked  to the trochars appropriately. At this time I proceeded to incised the gastrohepatic ligament.  At this time I proceeded to mobilize the stomach inferiorly and visualize the right crus. The peritoneum over the right crus was incised and right crus was identified. I proceeded to dissect this inferiorly until the left crus was seen joining the right crus. Once the right crus was adequately dissected we turned our to the left crus which was dissected away. This required traction of the stomach to the right side. Once this was visualized we then proceeded to circumferentially dissect the esophagus away from the surrounding tissue. The anterior and posterior vagus was seen along the esophagus at the GE junction.  These were both preserved throughout the entire case.  At this time the phrenoesophageal fat pad was dissected away from the esophagus. There was a large-sized hiatal hernia seen and hernia sac. I mobilized the esophagus cephalad approximately 5-6 cm, clearing away the surrounding tissue. The anterior hernia sac was dissected away from the stomach and esophagus.  At this time we turned our attention to the greater curvature the stomach and the omentum was mobilized using the robotic vessel sealer. This was taken up to the greater curvature to the hiatus. This mobilized the entire greater curvature to allow mobilization and the wrap. I then proceeded to bring the greater curvature the stomach posterior to the esophagus, and a shoeshine technique was used to evaluate the mobilization of the greater curvature.   At this time I proceeded to close the  hiatus using interrupted 0 Ethibonds x 2. This brought together the hiatal closure without undue stricture to the esophagus.   A piece of Gore Bio A hiatal mesh was placed over the hiatal closure and sutured to the crus using 0 Ethibonds sutures x 4.  At this time the greater curvature was brought around the esophagus and sutured using 0 silk sutures interrupted  fashion approximately 1 cm apart x3 on each side of the esophagus in a Toupet fashion. A left collar stitch was then used to gastropexy the stomach from the wrap to the diaphragm just lateral to the left crus as.  A second collar stitch was placed from the wrap to the right crus.  The wrap lay loose with no strangulation of the esophagus.  At this time the robot was undocked. The liver retractor was removed.  There was some bleeding that was slow.  A piece of Surgicel was palced to help with hemostatsis.  This stopped at the time we removed our camera.  At this time insufflation was evacuated. Skin was reapproximated for Monocryl subcuticular fashion. The skin was then dressed with Dermabond. The patient tolerated the procedure well and was taken to the recovery room in stable condition.    PLAN OF CARE: Admit for overnight observation  PATIENT DISPOSITION:  PACU - hemodynamically stable.   Delay start of Pharmacological VTE agent (>24hrs) due to surgical blood loss or risk of bleeding: not applicable

## 2022-03-03 NOTE — Anesthesia Postprocedure Evaluation (Signed)
Anesthesia Post Note  Patient: [name]  Procedure(s) Performed: XI ROBOTIC ASSISTED HIATAL HERNIA REPAIR WITH MESH AND FUNDOPLICATION     Patient location during evaluation: PACU Anesthesia Type: General Level of consciousness: awake and alert Pain management: pain level controlled Vital Signs Assessment: post-procedure vital signs reviewed and stable Respiratory status: spontaneous breathing, nonlabored ventilation, respiratory function stable and patient connected to nasal cannula oxygen Cardiovascular status: blood pressure returned to baseline and stable Postop Assessment: no apparent nausea or vomiting Anesthetic complications: no  No notable events documented.  Last Vitals:  Vitals:   03/03/22 1130 03/03/22 1145  BP: 137/73 (!) 140/72  Pulse: 77 76  Resp: 13 12  Temp:    SpO2: 97% 98%    Last Pain:  Vitals:   03/03/22 1045  TempSrc:   PainSc: Asleep                 Renella Steig,W. EDMOND

## 2022-03-03 NOTE — Transfer of Care (Signed)
Immediate Anesthesia Transfer of Care Note  Patient: [name]  Procedure(s) Performed: XI ROBOTIC ASSISTED HIATAL HERNIA REPAIR WITH MESH AND FUNDOPLICATION  Patient Location: PACU  Anesthesia Type:General  Level of Consciousness: awake, drowsy, patient cooperative, and responds to stimulation  Airway & Oxygen Therapy: Patient Spontanous Breathing and Patient connected to face mask oxygen  Post-op Assessment: Report given to RN and Post -op Vital signs reviewed and stable  Post vital signs: Reviewed and stable  Last Vitals:  Vitals Value Taken Time  BP 138/72 03/03/22 0949  Temp    Pulse 81 03/03/22 0951  Resp 20 03/03/22 0951  SpO2 97 % 03/03/22 0951  Vitals shown include unvalidated device data.  Last Pain:  Vitals:   03/03/22 0609  TempSrc:   PainSc: 2       Patients Stated Pain Goal: 0 (25/83/46 2194)  Complications: No notable events documented.

## 2022-03-03 NOTE — Anesthesia Procedure Notes (Signed)
Procedure Name: Intubation Date/Time: 03/03/2022 7:32 AM  Performed by: Cathren Harsh, CRNAPre-anesthesia Checklist: Patient identified, Emergency Drugs available, Suction available and Patient being monitored Patient Re-evaluated:Patient Re-evaluated prior to induction Oxygen Delivery Method: Circle System Utilized Preoxygenation: Pre-oxygenation with 100% oxygen Induction Type: IV induction, Cricoid Pressure applied and Rapid sequence Laryngoscope Size: Mac and 3 Grade View: Grade II Tube type: Oral Tube size: 7.0 mm Number of attempts: 1 Airway Equipment and Method: Stylet and Oral airway Placement Confirmation: ETT inserted through vocal cords under direct vision, positive ETCO2 and breath sounds checked- equal and bilateral Secured at: 22 cm Tube secured with: Tape Dental Injury: Teeth and Oropharynx as per pre-operative assessment

## 2022-03-03 NOTE — Interval H&P Note (Signed)
History and Physical Interval Note:  03/03/2022 7:18 AM  [name]  has presented today for surgery, with the diagnosis of HIATAL HERNIA GERD.  The various methods of treatment have been discussed with the patient and family. After consideration of risks, benefits and other options for treatment, the patient has consented to  Procedure(s): XI ROBOTIC ASSISTED HIATAL HERNIA REPAIR WITH MESH AND FUNDOPLICATION (N/A) as a surgical intervention.  The patient's history has been reviewed, patient examined, no change in status, stable for surgery.  I have reviewed the patient's chart and labs.  Questions were answered to the patient's satisfaction.     Ralene Ok

## 2022-03-04 ENCOUNTER — Observation Stay (HOSPITAL_COMMUNITY): Payer: Medicare Other

## 2022-03-04 ENCOUNTER — Other Ambulatory Visit (HOSPITAL_COMMUNITY): Payer: Self-pay

## 2022-03-04 ENCOUNTER — Encounter (HOSPITAL_COMMUNITY): Payer: Self-pay | Admitting: General Surgery

## 2022-03-04 DIAGNOSIS — I10 Essential (primary) hypertension: Secondary | ICD-10-CM | POA: Diagnosis not present

## 2022-03-04 DIAGNOSIS — K449 Diaphragmatic hernia without obstruction or gangrene: Secondary | ICD-10-CM | POA: Diagnosis not present

## 2022-03-04 DIAGNOSIS — I272 Pulmonary hypertension, unspecified: Secondary | ICD-10-CM | POA: Diagnosis not present

## 2022-03-04 DIAGNOSIS — K2100 Gastro-esophageal reflux disease with esophagitis, without bleeding: Secondary | ICD-10-CM | POA: Diagnosis not present

## 2022-03-04 DIAGNOSIS — Z79899 Other long term (current) drug therapy: Secondary | ICD-10-CM | POA: Diagnosis not present

## 2022-03-04 DIAGNOSIS — I2699 Other pulmonary embolism without acute cor pulmonale: Secondary | ICD-10-CM | POA: Diagnosis not present

## 2022-03-04 DIAGNOSIS — I081 Rheumatic disorders of both mitral and tricuspid valves: Secondary | ICD-10-CM | POA: Diagnosis not present

## 2022-03-04 DIAGNOSIS — E669 Obesity, unspecified: Secondary | ICD-10-CM | POA: Diagnosis not present

## 2022-03-04 DIAGNOSIS — I82461 Acute embolism and thrombosis of right calf muscular vein: Secondary | ICD-10-CM | POA: Diagnosis not present

## 2022-03-04 DIAGNOSIS — J9811 Atelectasis: Secondary | ICD-10-CM | POA: Diagnosis not present

## 2022-03-04 DIAGNOSIS — K224 Dyskinesia of esophagus: Secondary | ICD-10-CM | POA: Diagnosis not present

## 2022-03-04 DIAGNOSIS — I714 Abdominal aortic aneurysm, without rupture, unspecified: Secondary | ICD-10-CM | POA: Diagnosis not present

## 2022-03-04 DIAGNOSIS — Z7983 Long term (current) use of bisphosphonates: Secondary | ICD-10-CM | POA: Diagnosis not present

## 2022-03-04 DIAGNOSIS — E7800 Pure hypercholesterolemia, unspecified: Secondary | ICD-10-CM | POA: Diagnosis not present

## 2022-03-04 LAB — CBC
HCT: 38.6 % (ref 36.0–46.0)
HCT: 36.1 % (ref 36.0–46.0)
Hemoglobin: 13.1 g/dL (ref 12.0–15.0)
Hemoglobin: 12.2 g/dL (ref 12.0–15.0)
MCH: 31.8 pg (ref 26.0–34.0)
MCH: 31.6 pg (ref 26.0–34.0)
MCHC: 33.9 g/dL (ref 30.0–36.0)
MCHC: 33.8 g/dL (ref 30.0–36.0)
MCV: 93.7 fL (ref 80.0–100.0)
MCV: 93.5 fL (ref 80.0–100.0)
Platelets: 194 10*3/uL (ref 150–400)
Platelets: 174 10*3/uL (ref 150–400)
RBC: 4.12 MIL/uL (ref 3.87–5.11)
RBC: 3.86 MIL/uL — ABNORMAL LOW (ref 3.87–5.11)
RDW: 12.2 % (ref 11.5–15.5)
RDW: 12.2 % (ref 11.5–15.5)
WBC: 10.1 10*3/uL (ref 4.0–10.5)
WBC: 7.4 10*3/uL (ref 4.0–10.5)
nRBC: 0 % (ref 0.0–0.2)
nRBC: 0 % (ref 0.0–0.2)

## 2022-03-04 LAB — BASIC METABOLIC PANEL
Anion gap: 9 (ref 5–15)
BUN: 10 mg/dL (ref 8–23)
CO2: 24 mmol/L (ref 22–32)
Calcium: 8.6 mg/dL — ABNORMAL LOW (ref 8.9–10.3)
Chloride: 103 mmol/L (ref 98–111)
Creatinine, Ser: 0.76 mg/dL (ref 0.44–1.00)
GFR, Estimated: >60 mL/min (ref >60)
Glucose, Bld: 143 mg/dL — ABNORMAL HIGH (ref 70–99)
Potassium: 4.6 mmol/L (ref 3.5–5.1)
Sodium: 136 mmol/L (ref 135–145)

## 2022-03-04 LAB — HEPARIN LEVEL (UNFRACTIONATED): Heparin Unfractionated: 0.54 IU/mL (ref 0.30–0.70)

## 2022-03-04 LAB — TROPONIN I (HIGH SENSITIVITY)
Troponin I (High Sensitivity): 157 ng/L (ref <18)
Troponin I (High Sensitivity): 195 ng/L (ref <18)

## 2022-03-04 LAB — BRAIN NATRIURETIC PEPTIDE: B Natriuretic Peptide: 356.4 pg/mL — ABNORMAL HIGH (ref 0.0–100.0)

## 2022-03-04 MED ORDER — IOHEXOL 300 MG/ML  SOLN: 100.0000 mL | Freq: Once | INTRAMUSCULAR | Status: DC | PRN

## 2022-03-04 MED ORDER — HEPARIN (PORCINE) 25000 UT/250ML-% IV SOLN
1100.0000 [IU]/h | INTRAVENOUS | Status: DC
  Administered 2022-03-04: 1100 [IU]/h via INTRAVENOUS
  Filled 2022-03-04 (×2): qty 250

## 2022-03-04 MED ORDER — IOHEXOL 350 MG/ML SOLN
75.0000 mL | Freq: Once | INTRAVENOUS | Status: AC | PRN
  Administered 2022-03-04: 75 mL via INTRAVENOUS

## 2022-03-04 MED ORDER — HYDROCODONE-ACETAMINOPHEN 7.5-325 MG/15ML PO SOLN: 15.0000 mL | Freq: Four times a day (QID) | ORAL | 0 refills | Status: DC | PRN

## 2022-03-04 MED ORDER — HEPARIN BOLUS VIA INFUSION
4000.0000 [IU] | Freq: Once | INTRAVENOUS | Status: AC
  Administered 2022-03-04: 4000 [IU] via INTRAVENOUS
  Filled 2022-03-04: qty 4000

## 2022-03-04 MED ORDER — BOOST / RESOURCE BREEZE PO LIQD CUSTOM
1.0000 | Freq: Three times a day (TID) | ORAL | Status: DC
  Administered 2022-03-04: 1 via ORAL

## 2022-03-04 NOTE — Progress Notes (Signed)
Bumped up O2 to 3L, sats 93%.

## 2022-03-04 NOTE — Plan of Care (Signed)

## 2022-03-04 NOTE — Progress Notes (Signed)
ANTICOAGULATION CONSULT NOTE - Initial Consult  Pharmacy Consult for Heparin  Indication: pulmonary embolus  No Known Allergies  Patient Measurements: Height: 'Hannah\' 4"'$  (162.6 cm) Weight: 79.8 kg (176 lb) IBW/kg (Calculated) : 54.7 Heparin Dosing Weight: 71.8 kg   Vital Signs: Temp: 98.3 F (36.8 C) (01/31 1241) Temp Source: Oral (01/31 1241) BP: 132/76 (01/31 1337) Pulse Rate: 82 (01/31 1337)  Labs: Recent Labs    03/04/22 0216  HGB 13.1  HCT 38.6  PLT 194  CREATININE 0.76    Estimated Creatinine Clearance: 58.2 mL/min (by C-G formula based on SCr of 0.76 mg/dL).   Medical History: Past Medical History:  Diagnosis Date   Anemia    Ascending aorta dilatation (HCC)    Atrial dilatation    Bartholin cyst    Compression fracture    lumbar    Constipation    Disorder of bone density and structure, unspecified    Fatty liver    Hearing loss    History of colon polyps    History of hiatal hernia    History of SCC (squamous cell carcinoma) of skin    Hypertension    Liver lesion    Mild cardiomegaly    Mitral regurgitation    Multinodular goiter    Obesity    Other iron deficiency anemia    Primary osteoarthritis    Pulmonary hypertension (HCC)    Pure hypercholesterolemia    Scoliosis    Sensory - neural hearing loss    right   Tremor    Tricuspid regurgitation    Vitamin D deficiency     Medications:  Medications Prior to Admission  Medication Sig Dispense Refill Last Dose   acetaminophen (TYLENOL) 500 MG tablet Take 1,000 mg by mouth every 8 (eight) hours as needed (pain.).   03/02/2022   B Complex-C (B-COMPLEX WITH VITAMIN C) tablet Take 1 tablet by mouth daily.   Past Month   Brimonidine Tartrate (LUMIFY) 0.025 % SOLN Place 1 drop into both eyes 2 (two) times daily as needed (dry/irritated eyes.).   03/03/2022 at 0300   carboxymethylcellulose (REFRESH PLUS) 0.Hannah % SOLN Place 1 drop into both eyes 3 (three) times daily as needed (dry/irritated eyes.).    03/02/2022   cetirizine (ZYRTEC) 10 MG tablet Take 10 mg by mouth daily as needed (seasonal allergies.).   Past Week   Cholecalciferol (VITAMIN D-3 PO) Take 1 tablet by mouth in the morning.   Past Week   COLLAGEN PO Take 2 Scoops by mouth daily. Mix with coffee (Collagen Peptides)   03/02/2022   Cyanocobalamin (VITAMIN B-12 PO) Take 1,000 mcg by mouth daily.   Past Month   ferrous sulfate 325 (65 FE) MG tablet Take 325 mg by mouth daily with breakfast.   Past Month   fluorouracil (EFUDEX) Hannah % cream Apply 1 Application topically 2 (two) times daily as needed (skin lesions).      FOSAMAX 70 MG tablet Take 70 mg by mouth every Sunday.   03/01/2022   Magnesium 250 MG TABS Take 250 mg by mouth daily.   03/01/2022   melatonin Hannah MG TABS Take Hannah mg by mouth at bedtime.   03/02/2022   metoprolol succinate (TOPROL-XL) 25 MG 24 hr tablet Take 25 mg by mouth at bedtime.   03/02/2022 at 2100   METRONIDAZOLE, TOPICAL, 0.75 % LOTN Apply 1 Application topically daily as needed (rosacea).      Multiple Vitamins-Minerals (PRESERVISION AREDS 2 PO) Take 1 tablet by mouth  in the morning and at bedtime.   Past Week   omeprazole (PRILOSEC) 20 MG capsule Take 20 mg by mouth 2 (two) times daily.   03/03/2022 at 0400   Probiotic Product (MISC INTESTINAL FLORA REGULAT) CAPS Take 1 capsule by mouth daily.   Past Week   rosuvastatin (CRESTOR) 10 MG tablet Take 10 mg by mouth at bedtime.   03/02/2022   tretinoin (RETIN-A) 0.05 % cream Apply 1 application  topically daily as needed (rosacea.).   Past Week   TURMERIC PO Take 1 capsule by mouth daily.   Past Month   Multiple Vitamin (MULTIVITAMIN WITH MINERALS) TABS tablet Take 1 tablet by mouth daily.       Assessment: 77 y.o [name] admitted on 03/03/22.   Today POD #1 s/p robotic assisted hiatal hernia repair w/mesh and fundoplication.  Plan was to  discharge today 03/04/22  then patient had episode after ambulation from bathroom, when she broke out in cold sweat,  SOB, O2 sats down  on 2L O2 and BP slightly elevated.   CT angio chest 1/31 showed Positive for acute PE with CT evidence of right heart strain (RV/LV Ratio = 1.9) consistent with at least submassive (intermediate risk) PE.   Pharmacy consulted to start IV heparin now. CBC wnl.  Pre-op hgb 14 (1/24) and post op Hgb 13.1 (1/31).   No bleeding  reported.  Not on anticoagulation prior to admission.   30 day copay check for DOAC and warfarin requested:  Eliquis copay is $258.66, [facility] copay is $252.70 due to a $145.00 deductible, Warfarin copay is $3.00   Goal of Therapy:  Heparin level 0.3-0.7 units/ml Monitor platelets by anticoagulation protocol: Yes   Plan:   Give Heparin 4000 units  IV bolus x 1  Start IV heparin drip at 1100 units/hr Check 8 hour heparin level Daily Heparin level and CBC.      Thank you for allowing pharmacy to be part of this patients care team.  Nicole Cella, RPh Clinical Pharmacist 03/04/2022,2:41 PM

## 2022-03-04 NOTE — TOC Benefit Eligibility Note (Signed)
Patient Teacher, English as a foreign language completed.    The patient is currently admitted and upon discharge could be taking wafarin (Coumadin) 5 mg tablets.  The current 30 day co-pay is $3.00.   The patient is currently admitted and upon discharge could be taking Eliquis 5 mg tablets.  The current 30 day co-pay is $258.66 due to a $145.00 deductible.   The patient is currently admitted and upon discharge could be taking Xarelto 20 mg tablets.  The current 30 day co-pay is $252.70 due to a $145.00 deductible.  The patient is insured through [employer] [place], [place] Patient Advocate Specialist [place] Patient Advocate Team Direct Number: [phone]  Fax: [phone]

## 2022-03-04 NOTE — Progress Notes (Signed)
Called CT and let them know they can get the pt, [name] put in a new IV for CT, they will be up shortly. Currently, pulse is 85 and sats 94 on 3L. Shortness of breath is better but she feels like she just walked up 2 flights of stairs. BP 132/76 map94.

## 2022-03-04 NOTE — Discharge Summary (Signed)
Physician Discharge Summary  Patient ID: [name] MRN: [id number] DOB/AGE: 1945-09-17 77 y.o.  Admit date: 03/03/2022 Discharge date: 03/04/2022  Admission Diagnoses: Hiatal hernia  Discharge Diagnoses:  Principal Problem:   Hiatal hernia Active Problems:   S/P Nissen fundoplication (without gastrostomy tube) procedure   Discharged Condition: good  Hospital Course: Patient was admitted postop.  She was admitted to the floor.  She was started on liquid diet and did well with this.  On postop day 1 she underwent esophagram which was negative for leak.  She otherwise stable, ambulating well on her own.  Had good pain control.  Was deemed stable for discharge and discharged home.  Consults: None  Significant Diagnostic Studies: Esophagram without leak  Treatments: surgery: As above  Discharge Exam: Blood pressure 117/60, pulse 80, temperature 98.8 F (37.1 C), temperature source Oral, resp. rate 18, height '5\' 4"'$  (1.626 m), weight 79.8 kg, SpO2 92 %. General appearance: alert and cooperative GI: soft, non-tender; bowel sounds normal; no masses,  no organomegaly and incision clean dry and intact.  Disposition: Discharge disposition: 01-Home or Self Care       Discharge Instructions     Diet - low sodium heart healthy   Complete by: As directed    Increase activity slowly   Complete by: As directed       Allergies as of 03/04/2022   No Known Allergies      Medication List     TAKE these medications    acetaminophen 500 MG tablet Commonly known as: TYLENOL Take 1,000 mg by mouth every 8 (eight) hours as needed (pain.).   B-complex with vitamin C tablet Take 1 tablet by mouth daily.   carboxymethylcellulose 0.5 % Soln Commonly known as: REFRESH PLUS Place 1 drop into both eyes 3 (three) times daily as needed (dry/irritated eyes.).   cetirizine 10 MG tablet Commonly known as: ZYRTEC Take 10 mg by mouth daily as needed (seasonal allergies.).    COLLAGEN PO Take 2 Scoops by mouth daily. Mix with coffee (Collagen Peptides)   ferrous sulfate 325 (65 FE) MG tablet Take 325 mg by mouth daily with breakfast.   fluorouracil 5 % cream Commonly known as: EFUDEX Apply 1 Application topically 2 (two) times daily as needed (skin lesions).   Fosamax 70 MG tablet Generic drug: alendronate Take 70 mg by mouth every Sunday.   HYDROcodone-acetaminophen 7.5-325 mg/15 ml solution Commonly known as: HYCET Take 15 mLs by mouth 4 (four) times daily as needed for moderate pain.   Lumify 0.025 % Soln Generic drug: Brimonidine Tartrate Place 1 drop into both eyes 2 (two) times daily as needed (dry/irritated eyes.).   Magnesium 250 MG Tabs Take 250 mg by mouth daily.   melatonin 5 MG Tabs Take 5 mg by mouth at bedtime.   metoprolol succinate 25 MG 24 hr tablet Commonly known as: TOPROL-XL Take 25 mg by mouth at bedtime.   METRONIDAZOLE (TOPICAL) 0.75 % Lotn Apply 1 Application topically daily as needed (rosacea).   Misc Intestinal Flora Regulat Caps Take 1 capsule by mouth daily.   multivitamin with minerals Tabs tablet Take 1 tablet by mouth daily.   omeprazole 20 MG capsule Commonly known as: PRILOSEC Take 20 mg by mouth 2 (two) times daily.   PRESERVISION AREDS 2 PO Take 1 tablet by mouth in the morning and at bedtime.   rosuvastatin 10 MG tablet Commonly known as: CRESTOR Take 10 mg by mouth at bedtime.   tretinoin 0.05 %  cream Commonly known as: RETIN-A Apply 1 application  topically daily as needed (rosacea.).   TURMERIC PO Take 1 capsule by mouth daily.   VITAMIN B-12 PO Take 1,000 mcg by mouth daily.   VITAMIN D-3 PO Take 1 tablet by mouth in the morning.        Follow-up Information     Ralene Ok, MD. Schedule an appointment as soon as possible for a visit.   Specialty: General Surgery Why: Post op visit Contact information: [address] [place]   [id number] [phone]                 Signed: Ralene Ok 03/04/2022, 12:11 PM

## 2022-03-04 NOTE — Care Management Obs Status (Signed)
[place] NOTIFICATION   Patient Details  Name: [name] MRN: [id number] Date of Birth: 1945/07/29   Medicare Observation Status Notification Given:  Yes    Carles Collet, RN 03/04/2022, 10:29 AM

## 2022-03-04 NOTE — Progress Notes (Signed)
Pt back from CT

## 2022-03-04 NOTE — Progress Notes (Signed)
MD Shawnie Pons. Notified, critical lab value troponin 157

## 2022-03-04 NOTE — Progress Notes (Signed)
Assisted pt to the bathroom, when she got back, she broke out in a cold sweat and was short of breath. BP up a little, sats down, Placed Oxygen at 2L and informed her nurse and Dr. Rosendo Gros. Order placed for scan stat. O2 sats on the 2 L still at 88-89%.

## 2022-03-04 NOTE — Progress Notes (Signed)
ANTICOAGULATION CONSULT NOTE - Initial Consult  Pharmacy Consult for Heparin  Indication: pulmonary embolus  No Known Allergies  Patient Measurements: Height: '5\' 4"'$  (162.6 cm) Weight: 79.8 kg (176 lb) IBW/kg (Calculated) : 54.7 Heparin Dosing Weight: 71.8 kg   Vital Signs: Temp: 98.5 F (36.9 C) (01/31 2138) Temp Source: Oral (01/31 2138) BP: 152/89 (01/31 2138) Pulse Rate: 92 (01/31 2138)  Labs: Recent Labs    03/04/22 0216 03/04/22 1546 03/04/22 1737 03/04/22 2247  HGB 13.1  --   --  12.2  HCT 38.6  --   --  36.1  PLT 194  --   --  174  HEPARINUNFRC  --   --   --  0.54  CREATININE 0.76  --   --   --   TROPONINIHS  --  157* 195*  --      Estimated Creatinine Clearance: 58.2 mL/min (by C-G formula based on SCr of 0.76 mg/dL).   Medical History: Past Medical History:  Diagnosis Date   Anemia    Ascending aorta dilatation (HCC)    Atrial dilatation    Bartholin cyst    Compression fracture    lumbar    Constipation    Disorder of bone density and structure, unspecified    Fatty liver    Hearing loss    History of colon polyps    History of hiatal hernia    History of SCC (squamous cell carcinoma) of skin    Hypertension    Liver lesion    Mild cardiomegaly    Mitral regurgitation    Multinodular goiter    Obesity    Other iron deficiency anemia    Primary osteoarthritis    Pulmonary hypertension (HCC)    Pure hypercholesterolemia    Scoliosis    Sensory - neural hearing loss    right   Tremor    Tricuspid regurgitation    Vitamin D deficiency     Medications:  Medications Prior to Admission  Medication Sig Dispense Refill Last Dose   acetaminophen (TYLENOL) 500 MG tablet Take 1,000 mg by mouth every 8 (eight) hours as needed (pain.).   03/02/2022   B Complex-C (B-COMPLEX WITH VITAMIN C) tablet Take 1 tablet by mouth daily.   Past Month   Brimonidine Tartrate (LUMIFY) 0.025 % SOLN Place 1 drop into both eyes 2 (two) times daily as needed  (dry/irritated eyes.).   03/03/2022 at 0300   carboxymethylcellulose (REFRESH PLUS) 0.5 % SOLN Place 1 drop into both eyes 3 (three) times daily as needed (dry/irritated eyes.).   03/02/2022   cetirizine (ZYRTEC) 10 MG tablet Take 10 mg by mouth daily as needed (seasonal allergies.).   Past Week   Cholecalciferol (VITAMIN D-3 PO) Take 1 tablet by mouth in the morning.   Past Week   COLLAGEN PO Take 2 Scoops by mouth daily. Mix with coffee (Collagen Peptides)   03/02/2022   Cyanocobalamin (VITAMIN B-12 PO) Take 1,000 mcg by mouth daily.   Past Month   ferrous sulfate 325 (65 FE) MG tablet Take 325 mg by mouth daily with breakfast.   Past Month   fluorouracil (EFUDEX) 5 % cream Apply 1 Application topically 2 (two) times daily as needed (skin lesions).      FOSAMAX 70 MG tablet Take 70 mg by mouth every Sunday.   03/01/2022   Magnesium 250 MG TABS Take 250 mg by mouth daily.   03/01/2022   melatonin 5 MG TABS Take 5 mg by  mouth at bedtime.   03/02/2022   metoprolol succinate (TOPROL-XL) 25 MG 24 hr tablet Take 25 mg by mouth at bedtime.   03/02/2022 at 2100   METRONIDAZOLE, TOPICAL, 0.75 % LOTN Apply 1 Application topically daily as needed (rosacea).      Multiple Vitamins-Minerals (PRESERVISION AREDS 2 PO) Take 1 tablet by mouth in the morning and at bedtime.   Past Week   omeprazole (PRILOSEC) 20 MG capsule Take 20 mg by mouth 2 (two) times daily.   03/03/2022 at 0400   Probiotic Product (MISC INTESTINAL FLORA REGULAT) CAPS Take 1 capsule by mouth daily.   Past Week   rosuvastatin (CRESTOR) 10 MG tablet Take 10 mg by mouth at bedtime.   03/02/2022   tretinoin (RETIN-A) 0.05 % cream Apply 1 application  topically daily as needed (rosacea.).   Past Week   TURMERIC PO Take 1 capsule by mouth daily.   Past Month   Multiple Vitamin (MULTIVITAMIN WITH MINERALS) TABS tablet Take 1 tablet by mouth daily.       Assessment: 77 y.o female admitted on 03/03/22.   Today POD #1 s/p robotic assisted hiatal hernia  repair w/mesh and fundoplication.  Plan was to  discharge today 03/04/22  then patient had episode after ambulation from bathroom, when she broke out in cold sweat,  SOB, O2 sats down on 2L O2 and BP slightly elevated.   CT angio chest 1/31 showed Positive for acute PE with CT evidence of right heart strain (RV/LV Ratio = 1.9) consistent with at least submassive (intermediate risk) PE.   Pharmacy consulted to start IV heparin now. CBC wnl.  Pre-op hgb 14 (1/24) and post op Hgb 13.1 (1/31).   No bleeding  reported.  Not on anticoagulation prior to admission.   30 day copay check for DOAC and warfarin requested:  Eliquis copay is $258.66, [facility] copay is $252.70 due to a $145.00 deductible, Warfarin copay is $3.00   Heparin level came back therapeutic tonight. We will continue with the current rate and check confirm in AM.  Goal of Therapy:  Heparin level 0.3-0.7 units/ml Monitor platelets by anticoagulation protocol: Yes   Plan:  Cont IV heparin drip at 1100 units/hr Check AM heparin level Daily Heparin level and CBC.  Onnie Boer, PharmD, BCIDP, AAHIVP, CPP Infectious Disease Pharmacist 03/04/2022 11:53 PM  Addendum  Heparin level therapeutic again. Cont current rate  Onnie Boer, PharmD, [place], AAHIVP, CPP Infectious Disease Pharmacist 03/05/2022 6:43 AM

## 2022-03-04 NOTE — Progress Notes (Signed)
Nutrition Education Note   RD received a consult for diet education for pureed diet x 2 weeks s/p hernial repair.   RD met with pt and pt husband in room. Pt already familiar with material due to requesting diet information from surgeon prior to surgery. RD re-iterated importance of pureed/blended foods for at least 2 weeks post-op. Discussed eating slowly and smaller portions, more frequently. Reviewed things to avoid. RD provided pt with coupons for Ensure's and encouraged pt to add protein powder into foods that will add additional calories and protein. RD to provide oral nutrition supplement while in hospital.   Pt and husband with no other questions at this time. RD encouraged pt to inform RN if questions arise.   Hermina Barters RD, LDN Clinical Dietitian See Shea Evans for contact information.

## 2022-03-04 NOTE — Consult Note (Signed)
Initial Consultation Note   Patient: [name] JWJ:[id number] DOB: 1945/06/22 PCP: Kristen Loader, FNP DOA: 03/03/2022 DOS: the patient was seen and examined on 03/04/2022 Primary service: Ralene Ok, MD  Referring physician: Dr. Rosendo Gros Reason for consult: PE  Assessment/Plan: Assessment and Plan:  MARIGOLD MOM is a 77 y.o. female with past medical history of GERD, AAA, moderate MR/TR, pHTN, HLD admitted for hiatal hernia repair found to have AHRI 2/2 PE.   Acute PE AHRI 1 month of SOB with exertion noted prior to hospital arrival. In hospital after surgery had O2 requirement and developed SOB with exertion to bathroom. CT PE obtained with acute PE in [facility]. EKG notable for incomplete RBBB, unable to see previous to compare to. No clear etiology of PE. Will obtain additional labs and imaging to risk stratify and see if intervention is warranted.  -additional labs bnp, troponin -additional work up including TTE, [place] LE  -agree with heparin -stop metoprolol  AAA pHTN Moderate MR/TR HTN HLD  TRH will continue to follow the patient.  HPI: [name] is a 77 y.o. female with past medical history of GERD, AAA, moderate MR/TR, pHTN, HLD admitted for hiatal hernia repair found to have AHRI 2/2 PE.   Pt reports 1 month of SOB with exertion. She previously did not wear oxygen before coming into the hospital.  While she was in the hospital she tried to walk to bathroom became profoundly short of breath and broke out in a sweat.  She otherwise denies any chest pain, chest pressure, syncope, or other notable symptoms.  She denies any issues with bleeding.  She denies any recent prolonged travel.  She reports she is up-to-date on her cancer screenings.  Review of Systems: As mentioned in the history of present illness. All other systems reviewed and are negative. Past Medical History:  Diagnosis Date   Anemia    Ascending aorta dilatation (HCC)    Atrial dilatation     Bartholin cyst    Compression fracture    lumbar    Constipation    Disorder of bone density and structure, unspecified    Fatty liver    Hearing loss    History of colon polyps    History of hiatal hernia    History of SCC (squamous cell carcinoma) of skin    Hypertension    Liver lesion    Mild cardiomegaly    Mitral regurgitation    Multinodular goiter    Obesity    Other iron deficiency anemia    Primary osteoarthritis    Pulmonary hypertension (HCC)    Pure hypercholesterolemia    Scoliosis    Sensory - neural hearing loss    right   Tremor    Tricuspid regurgitation    Vitamin D deficiency    Past Surgical History:  Procedure Laterality Date   APPENDECTOMY     BONE ANCHORED HEARING AID IMPLANT Right 10/10/2014   Procedure: BONE ANCHORED HEARING AID (BAHA) IMPLANT TEMPORAL BONE RIGHT EAR;  Surgeon: Vicie Mutters, MD;  Location: [facility];  Service: ENT;  Laterality: Right;   BREAST REDUCTION SURGERY     CARPAL TUNNEL RELEASE Right    CATARACT EXTRACTION Bilateral    ROTATOR CUFF REPAIR Right    STAPEDECTOMY Bilateral    has metal implants (1 in each ear) - pt cannot have a MRI of her head/brain   UMBILICAL HERNIA REPAIR     XI ROBOTIC ASSISTED HIATAL HERNIA REPAIR  N/A 03/03/2022   Procedure: XI ROBOTIC ASSISTED HIATAL HERNIA REPAIR WITH MESH AND FUNDOPLICATION;  Surgeon: Ralene Ok, MD;  Location: [facility];  Service: General;  Laterality: N/A;   Social History:  reports that she has never smoked. She has never used smokeless tobacco. She reports current alcohol use. She reports that she does not use drugs.  No Known Allergies  Family History  Problem Relation Age of Onset   Cancer Sister     Prior to Admission medications   Medication Sig Start Date End Date Taking? Authorizing Provider  acetaminophen (TYLENOL) 500 MG tablet Take 1,000 mg by mouth every 8 (eight) hours as needed (pain.).   Yes [provider]  B Complex-C (B-COMPLEX  WITH VITAMIN C) tablet Take 1 tablet by mouth daily.   Yes [provider]  Brimonidine Tartrate (LUMIFY) 0.025 % SOLN Place 1 drop into both eyes 2 (two) times daily as needed (dry/irritated eyes.).   Yes [provider]  carboxymethylcellulose (REFRESH PLUS) 0.5 % SOLN Place 1 drop into both eyes 3 (three) times daily as needed (dry/irritated eyes.).   Yes [provider]  cetirizine (ZYRTEC) 10 MG tablet Take 10 mg by mouth daily as needed (seasonal allergies.).   Yes [provider]  Cholecalciferol (VITAMIN D-3 PO) Take 1 tablet by mouth in the morning.   Yes [provider]  COLLAGEN PO Take 2 Scoops by mouth daily. Mix with coffee (Collagen Peptides)   Yes [provider]  Cyanocobalamin (VITAMIN B-12 PO) Take 1,000 mcg by mouth daily.   Yes [provider]  ferrous sulfate 325 (65 FE) MG tablet Take 325 mg by mouth daily with breakfast.   Yes [provider]  fluorouracil (EFUDEX) 5 % cream Apply 1 Application topically 2 (two) times daily as needed (skin lesions).   Yes [provider]  FOSAMAX 70 MG tablet Take 70 mg by mouth every Sunday. 12/24/20  Yes [provider]  HYDROcodone-acetaminophen (HYCET) 7.5-325 mg/15 ml solution Take 15 mLs by mouth 4 (four) times daily as needed for moderate pain. 03/04/22 03/04/23 Yes Ralene Ok, MD  Magnesium 250 MG TABS Take 250 mg by mouth daily.   Yes [provider]  melatonin 5 MG TABS Take 5 mg by mouth at bedtime.   Yes [provider]  metoprolol succinate (TOPROL-XL) 25 MG 24 hr tablet Take 25 mg by mouth at bedtime. 12/19/21  Yes [provider]  METRONIDAZOLE, TOPICAL, 0.75 % LOTN Apply 1 Application topically daily as needed (rosacea).   Yes [provider]  Multiple Vitamins-Minerals (PRESERVISION AREDS 2 PO) Take 1 tablet by mouth in the morning and at bedtime.   Yes [provider]  omeprazole  (PRILOSEC) 20 MG capsule Take 20 mg by mouth 2 (two) times daily. 05/22/21  Yes [provider]  Probiotic Product (MISC INTESTINAL FLORA REGULAT) CAPS Take 1 capsule by mouth daily.   Yes [provider]  rosuvastatin (CRESTOR) 10 MG tablet Take 10 mg by mouth at bedtime. 03/03/21  Yes [provider]  tretinoin (RETIN-A) 0.05 % cream Apply 1 application  topically daily as needed (rosacea.).   Yes [provider]  TURMERIC PO Take 1 capsule by mouth daily.   Yes [provider]  Multiple Vitamin (MULTIVITAMIN WITH MINERALS) TABS tablet Take 1 tablet by mouth daily.    [provider]    Physical Exam: Vitals:   03/04/22 1149 03/04/22 1241 03/04/22 1305 03/04/22 1337  BP:  117/60 (!) 156/86 138/72 132/76  Pulse: 80 86 89 82  Resp: 18  (!) 22 20  Temp: 98.8 F (37.1 C) 98.3 F (36.8 C)    TempSrc: Oral Oral    SpO2: 92% (!) 88% (!) 87% 94%  Weight:      Height:       Physical Exam Vitals and nursing note reviewed.  Constitutional:      Appearance: Normal appearance. She is not ill-appearing.  HENT:     Head: Normocephalic and atraumatic.     Mouth/Throat:     Mouth: Mucous membranes are moist.  Cardiovascular:     Rate and Rhythm: Normal rate and regular rhythm.  Abdominal:     General: Abdomen is flat. There is no distension.     Palpations: Abdomen is soft. There is no mass.     Tenderness: There is no abdominal tenderness.  Musculoskeletal:     Right lower leg: No edema.     Left lower leg: No edema.  Skin:    General: Skin is warm and dry.     Capillary Refill: Capillary refill takes less than 2 seconds.  Neurological:     Mental Status: She is alert.  Psychiatric:        Mood and Affect: Mood normal.        Behavior: Behavior normal.     Data Reviewed:      Latest Ref Rng & Units 03/04/2022    2:16 AM 02/25/2022    3:37 PM 04/04/2008    8:48 PM  CBC  WBC 4.0 - 10.5 K/uL 10.1  4.4    Hemoglobin 12.0 - 15.0  g/dL 13.1  14.0  12.9   Hematocrit 36.0 - 46.0 % 38.6  42.4  38.0   Platelets 150 - 400 K/uL 194  247         Latest Ref Rng & Units 03/04/2022    2:16 AM 02/25/2022    3:37 PM 04/04/2008    8:48 PM  BMP  Glucose 70 - 99 mg/dL 143  106  100   BUN 8 - 23 mg/dL '10  20  26   '$ Creatinine 0.44 - 1.00 mg/dL 0.76  0.85  1.0   Sodium 135 - 145 mmol/L 136  138  140   Potassium 3.5 - 5.1 mmol/L 4.6  4.2  3.9   Chloride 98 - 111 mmol/L 103  103  107   CO2 22 - 32 mmol/L 24  28    Calcium 8.9 - 10.3 mg/dL 8.6  9.3         Family Communication: d/w husband at bedside Primary team communication: d/w primary team over phone Thank you very much for involving [place] in the care of your patient.  Author: Lorelei Pont, MD 03/04/2022 3:12 PM  For on call review [email or web address].

## 2022-03-04 NOTE — Progress Notes (Signed)
1 Day Post-Op   Subjective/Chief Complaint: PT doing well this aM DG esoph pending   Objective: Vital signs in last 24 hours: Temp:  [97.5 F (36.4 C)-98.4 F (36.9 C)] 98.4 F (36.9 C) (01/31 0417) Pulse Rate:  [69-84] 84 (01/31 0417) Resp:  [12-21] 17 (01/30 1558) BP: (122-145)/(59-87) 129/65 (01/31 0417) SpO2:  [91 %-98 %] 93 % (01/31 0417)    Intake/Output from previous day: 01/30 0701 - 01/31 0700 In: 2636.6 [I.V.:2636.6] Out: 50 [Blood:50] Intake/Output this shift: No intake/output data recorded.  General appearance: alert and cooperative GI: soft, non-tender; bowel sounds normal; no masses,  no organomegaly and inc c/d/i  Lab Results:  Recent Labs    03/04/22 0216  WBC 10.1  HGB 13.1  HCT 38.6  PLT 194   BMET Recent Labs    03/04/22 0216  NA 136  K 4.6  CL 103  CO2 24  GLUCOSE 143*  BUN 10  CREATININE 0.76  CALCIUM 8.6*   Anti-infectives: Anti-infectives (From admission, onward)    Start     Dose/Rate Route Frequency Ordered Stop   03/03/22 0600  ceFAZolin (ANCEF) IVPB 2g/100 mL premix        2 g 200 mL/hr over 30 Minutes Intravenous On call to O.R. 03/03/22 0554 03/03/22 0746       Assessment/Plan: s/p Procedure(s): XI ROBOTIC ASSISTED HIATAL HERNIA REPAIR WITH MESH AND FUNDOPLICATION (N/A) POD 1 -await DG esoph study.  If OK with no leak, will be OK for pureed diet -mobilize -DC home later today if doing well  LOS: 0 days    Ralene Ok 03/04/2022

## 2022-03-05 ENCOUNTER — Observation Stay (HOSPITAL_COMMUNITY): Payer: Medicare Other

## 2022-03-05 ENCOUNTER — Other Ambulatory Visit (HOSPITAL_COMMUNITY): Payer: Self-pay

## 2022-03-05 DIAGNOSIS — I714 Abdominal aortic aneurysm, without rupture, unspecified: Secondary | ICD-10-CM | POA: Diagnosis present

## 2022-03-05 DIAGNOSIS — I2699 Other pulmonary embolism without acute cor pulmonale: Secondary | ICD-10-CM

## 2022-03-05 DIAGNOSIS — I081 Rheumatic disorders of both mitral and tricuspid valves: Secondary | ICD-10-CM | POA: Diagnosis present

## 2022-03-05 DIAGNOSIS — Z6830 Body mass index (BMI) 30.0-30.9, adult: Secondary | ICD-10-CM | POA: Diagnosis not present

## 2022-03-05 DIAGNOSIS — Z85828 Personal history of other malignant neoplasm of skin: Secondary | ICD-10-CM | POA: Diagnosis not present

## 2022-03-05 DIAGNOSIS — K449 Diaphragmatic hernia without obstruction or gangrene: Secondary | ICD-10-CM | POA: Diagnosis present

## 2022-03-05 DIAGNOSIS — R0603 Acute respiratory distress: Secondary | ICD-10-CM | POA: Diagnosis present

## 2022-03-05 DIAGNOSIS — Z808 Family history of malignant neoplasm of other organs or systems: Secondary | ICD-10-CM | POA: Diagnosis not present

## 2022-03-05 DIAGNOSIS — I82401 Acute embolism and thrombosis of unspecified deep veins of right lower extremity: Secondary | ICD-10-CM | POA: Diagnosis not present

## 2022-03-05 DIAGNOSIS — Z79899 Other long term (current) drug therapy: Secondary | ICD-10-CM | POA: Diagnosis not present

## 2022-03-05 DIAGNOSIS — I824Y1 Acute embolism and thrombosis of unspecified deep veins of right proximal lower extremity: Secondary | ICD-10-CM | POA: Diagnosis not present

## 2022-03-05 DIAGNOSIS — I82409 Acute embolism and thrombosis of unspecified deep veins of unspecified lower extremity: Secondary | ICD-10-CM | POA: Insufficient documentation

## 2022-03-05 DIAGNOSIS — J9601 Acute respiratory failure with hypoxia: Secondary | ICD-10-CM | POA: Diagnosis not present

## 2022-03-05 DIAGNOSIS — E669 Obesity, unspecified: Secondary | ICD-10-CM | POA: Diagnosis present

## 2022-03-05 DIAGNOSIS — Z803 Family history of malignant neoplasm of breast: Secondary | ICD-10-CM | POA: Diagnosis not present

## 2022-03-05 DIAGNOSIS — E7800 Pure hypercholesterolemia, unspecified: Secondary | ICD-10-CM | POA: Diagnosis present

## 2022-03-05 DIAGNOSIS — I089 Rheumatic multiple valve disease, unspecified: Secondary | ICD-10-CM | POA: Diagnosis not present

## 2022-03-05 DIAGNOSIS — I272 Pulmonary hypertension, unspecified: Secondary | ICD-10-CM | POA: Diagnosis present

## 2022-03-05 DIAGNOSIS — I10 Essential (primary) hypertension: Secondary | ICD-10-CM | POA: Diagnosis present

## 2022-03-05 DIAGNOSIS — K2100 Gastro-esophageal reflux disease with esophagitis, without bleeding: Secondary | ICD-10-CM | POA: Diagnosis present

## 2022-03-05 DIAGNOSIS — Z7983 Long term (current) use of bisphosphonates: Secondary | ICD-10-CM | POA: Diagnosis not present

## 2022-03-05 DIAGNOSIS — Z8249 Family history of ischemic heart disease and other diseases of the circulatory system: Secondary | ICD-10-CM | POA: Diagnosis not present

## 2022-03-05 DIAGNOSIS — I82461 Acute embolism and thrombosis of right calf muscular vein: Secondary | ICD-10-CM | POA: Diagnosis not present

## 2022-03-05 LAB — ECHOCARDIOGRAM COMPLETE
Area-P 1/2: 3.33 cm2
Height: 64 in
S' Lateral: 2.8 cm
Weight: 2816 oz

## 2022-03-05 LAB — CBC
HCT: 36.7 % (ref 36.0–46.0)
Hemoglobin: 12.4 g/dL (ref 12.0–15.0)
MCH: 32.1 pg (ref 26.0–34.0)
MCHC: 33.8 g/dL (ref 30.0–36.0)
MCV: 95.1 fL (ref 80.0–100.0)
Platelets: 173 10*3/uL (ref 150–400)
RBC: 3.86 MIL/uL — ABNORMAL LOW (ref 3.87–5.11)
RDW: 12.3 % (ref 11.5–15.5)
WBC: 6.7 10*3/uL (ref 4.0–10.5)
nRBC: 0 % (ref 0.0–0.2)

## 2022-03-05 LAB — HEPARIN LEVEL (UNFRACTIONATED): Heparin Unfractionated: 0.52 IU/mL (ref 0.30–0.70)

## 2022-03-05 MED ORDER — ENSURE MAX PROTEIN PO LIQD
11.0000 [oz_av] | Freq: Every day | ORAL | Status: DC
  Administered 2022-03-05 – 2022-03-08 (×3): 11 [oz_av] via ORAL
  Filled 2022-03-05 (×5): qty 330

## 2022-03-05 MED ORDER — APIXABAN (ELIQUIS) VTE STARTER PACK (10MG AND 5MG)
5.0000 mg | ORAL_TABLET | Freq: Two times a day (BID) | ORAL | 0 refills | Status: DC
  Filled 2022-03-05: qty 74, 30d supply, fill #0

## 2022-03-05 MED ORDER — APIXABAN 5 MG PO TABS: 5.0000 mg | ORAL_TABLET | Freq: Two times a day (BID) | ORAL | Status: DC

## 2022-03-05 MED ORDER — APIXABAN 5 MG PO TABS: ORAL_TABLET | ORAL | 2 refills | Status: DC

## 2022-03-05 MED ORDER — APIXABAN 5 MG PO TABS
10.0000 mg | ORAL_TABLET | Freq: Two times a day (BID) | ORAL | Status: DC
  Administered 2022-03-05 – 2022-03-09 (×9): 10 mg via ORAL
  Filled 2022-03-05 (×10): qty 2

## 2022-03-05 NOTE — Progress Notes (Signed)
ANTICOAGULATION CONSULT NOTE - Initial Consult  Pharmacy Consult for Heparin => Apixaban Indication: pulmonary embolus  No Known Allergies  Patient Measurements: Height: '5\' 4"'$  (162.6 cm) Weight: 79.8 kg (176 lb) IBW/kg (Calculated) : 54.7 Heparin Dosing Weight: 71.8 kg   Vital Signs: Temp: 98.3 F (36.8 C) (02/01 0909) Temp Source: Oral (02/01 0909) BP: 144/82 (02/01 0909) Pulse Rate: 84 (02/01 0909)  Labs: Recent Labs    03/04/22 0216 03/04/22 1546 03/04/22 1737 03/04/22 2247 03/05/22 0526  HGB 13.1  --   --  12.2 12.4  HCT 38.6  --   --  36.1 36.7  PLT 194  --   --  174 173  HEPARINUNFRC  --   --   --  0.54 0.52  CREATININE 0.76  --   --   --   --   TROPONINIHS  --  157* 195*  --   --      Estimated Creatinine Clearance: 58.2 mL/min (by C-G formula based on SCr of 0.76 mg/dL).   Medical History: Past Medical History:  Diagnosis Date   Anemia    Ascending aorta dilatation (HCC)    Atrial dilatation    Bartholin cyst    Compression fracture    lumbar    Constipation    Disorder of bone density and structure, unspecified    Fatty liver    Hearing loss    History of colon polyps    History of hiatal hernia    History of SCC (squamous cell carcinoma) of skin    Hypertension    Liver lesion    Mild cardiomegaly    Mitral regurgitation    Multinodular goiter    Obesity    Other iron deficiency anemia    Primary osteoarthritis    Pulmonary hypertension (HCC)    Pure hypercholesterolemia    Scoliosis    Sensory - neural hearing loss    right   Tremor    Tricuspid regurgitation    Vitamin D deficiency     Medications:  Medications Prior to Admission  Medication Sig Dispense Refill Last Dose   acetaminophen (TYLENOL) 500 MG tablet Take 1,000 mg by mouth every 8 (eight) hours as needed (pain.).   03/02/2022   B Complex-C (B-COMPLEX WITH VITAMIN C) tablet Take 1 tablet by mouth daily.   Past Month   Brimonidine Tartrate (LUMIFY) 0.025 % SOLN Place 1  drop into both eyes 2 (two) times daily as needed (dry/irritated eyes.).   03/03/2022 at 0300   carboxymethylcellulose (REFRESH PLUS) 0.5 % SOLN Place 1 drop into both eyes 3 (three) times daily as needed (dry/irritated eyes.).   03/02/2022   cetirizine (ZYRTEC) 10 MG tablet Take 10 mg by mouth daily as needed (seasonal allergies.).   Past Week   Cholecalciferol (VITAMIN D-3 PO) Take 1 tablet by mouth in the morning.   Past Week   COLLAGEN PO Take 2 Scoops by mouth daily. Mix with coffee (Collagen Peptides)   03/02/2022   Cyanocobalamin (VITAMIN B-12 PO) Take 1,000 mcg by mouth daily.   Past Month   ferrous sulfate 325 (65 FE) MG tablet Take 325 mg by mouth daily with breakfast.   Past Month   fluorouracil (EFUDEX) 5 % cream Apply 1 Application topically 2 (two) times daily as needed (skin lesions).      FOSAMAX 70 MG tablet Take 70 mg by mouth every Sunday.   03/01/2022   Magnesium 250 MG TABS Take 250 mg by mouth daily.  03/01/2022   melatonin 5 MG TABS Take 5 mg by mouth at bedtime.   03/02/2022   metoprolol succinate (TOPROL-XL) 25 MG 24 hr tablet Take 25 mg by mouth at bedtime.   03/02/2022 at 2100   METRONIDAZOLE, TOPICAL, 0.75 % LOTN Apply 1 Application topically daily as needed (rosacea).      Multiple Vitamins-Minerals (PRESERVISION AREDS 2 PO) Take 1 tablet by mouth in the morning and at bedtime.   Past Week   omeprazole (PRILOSEC) 20 MG capsule Take 20 mg by mouth 2 (two) times daily.   03/03/2022 at 0400   Probiotic Product (MISC INTESTINAL FLORA REGULAT) CAPS Take 1 capsule by mouth daily.   Past Week   rosuvastatin (CRESTOR) 10 MG tablet Take 10 mg by mouth at bedtime.   03/02/2022   tretinoin (RETIN-A) 0.05 % cream Apply 1 application  topically daily as needed (rosacea.).   Past Week   TURMERIC PO Take 1 capsule by mouth daily.   Past Month   Multiple Vitamin (MULTIVITAMIN WITH MINERALS) TABS tablet Take 1 tablet by mouth daily.       Assessment: 77 y.o [name] admitted on 03/03/22.    Today POD #1 s/p robotic assisted hiatal hernia repair w/mesh and fundoplication.  Plan was to  discharge today 03/04/22  then patient had episode after ambulation from bathroom, when she broke out in cold sweat,  SOB, O2 sats down on 2L O2 and BP slightly elevated.   CT angio chest 1/31 showed Positive for acute PE with CT evidence of right heart strain (RV/LV Ratio = 1.9) consistent with at least submassive (intermediate risk) PE.   Pharmacy consulted to start IV heparin now. CBC wnl.  Pre-op hgb 14 (1/24) and post op Hgb 13.1 (1/31).   No bleeding  reported.  Not on anticoagulation prior to admission.  30 day copay check for DOAC and warfarin requested:  Eliquis copay is $258.66, [place] [facility] copay is $252.70 due to a $145.00 deductible, Warfarin copay is $3.00   Pharmacy asked to convert to apixaban.  Plan:   Start apixaban 10 mg po bid x 7 days, then apixaban 5 mg po bid D/c Heparin drip. Monitor for signs and symptoms of bleeding  Alanda Slim, PharmD, [facility] Clinical Pharmacist Please see AMION for all Pharmacists' Contact Phone Numbers 03/05/2022, 12:25 PM

## 2022-03-05 NOTE — Progress Notes (Signed)
Lower extremity venous duplex has been completed.   Preliminary results in CV Proc.   [name] Keno Caraway 03/05/2022 11:14 AM

## 2022-03-05 NOTE — Progress Notes (Signed)
  Echocardiogram 2D Echocardiogram has been performed.  Wynelle Link 03/05/2022, 11:18 AM

## 2022-03-05 NOTE — Progress Notes (Signed)
Patient one assist up with walker without 02 to ambulate, 02 saturation decreased to 88%, patient became short of breath.  Patient assisted back to bed with 02 applied at 1L [place], sats remain above 92%.

## 2022-03-05 NOTE — TOC Initial Note (Signed)
Transition of Care (TOC) - Initial/Assessment Note   Spoke to patient and husband at bedside to discuss cost of Eliquis .   Eliquis 30 day supply $258.66 due to $145.00 deductible. After deductible reached 30 day supply will be $113.66.   Can use 30 day free card one time.   Patient would like to use [facility]. [place] will fill prescription using 30 day free card and deliver to bedside prior to discharge.   [place] open Monday through Friday 0830 to 1630.  Per chart anticipated discharge date 1 to 2 days.   Secure chatted  MD , he will send prescription to [facility]  Patient Details  Name: [name] MRN: [id number] Date of Birth: February 01, 1946  Transition of [facility]) CM/SW Contact:    Marilu Favre, RN Phone Number: 03/05/2022, 11:38 AM  Clinical Narrative:                   Expected Discharge Plan: Home/Self Care Barriers to Discharge: Continued Medical Work up   Patient Goals and CMS Choice Patient states their goals for this hospitalization and ongoing recovery are:: to return to home   Choice offered to / list presented to : NA      Expected Discharge Plan and Services       Living arrangements for the past 2 months: Single Family Home Expected Discharge Date: 03/04/22               DME Arranged: N/A DME Agency: NA       HH Arranged: NA          Prior Living Arrangements/Services Living arrangements for the past 2 months: Single Family Home Lives with:: Spouse Patient language and need for interpreter reviewed:: Yes Do you feel safe going back to the place where you live?: Yes      Need for Family Participation in Patient Care: Yes (Comment) Care giver support system in place?: Yes (comment)   Criminal Activity/Legal Involvement Pertinent to Current Situation/Hospitalization: No - Comment as needed  Activities of Daily Living Home Assistive Devices/Equipment: None ADL Screening (condition at time of admission) Patient's  cognitive ability adequate to safely complete daily activities?: Yes Is the patient deaf or have difficulty hearing?: Yes Does the patient have difficulty seeing, even when wearing glasses/contacts?: No Does the patient have difficulty concentrating, remembering, or making decisions?: No Patient able to express need for assistance with ADLs?: Yes Does the patient have difficulty dressing or bathing?: No Independently performs ADLs?: Yes (appropriate for developmental age) Does the patient have difficulty walking or climbing stairs?: No Weakness of Legs: Both Weakness of Arms/Hands: Both  Permission Sought/Granted   Permission granted to share information with : Yes, Verbal Permission Granted  Share Information with NAME: husband Fritz Pickerel           Emotional Assessment Appearance:: Appears stated age Attitude/Demeanor/Rapport: Engaged Affect (typically observed): Accepting Orientation: : Oriented to Self, Oriented to Place, Oriented to  Time, Oriented to Situation Alcohol / Substance Use: Not Applicable Psych Involvement: No (comment)  Admission diagnosis:  Hiatal hernia [K44.9] S/P Nissen fundoplication (without gastrostomy tube) procedure [Z98.890] Patient Active Problem List   Diagnosis Date Noted   S/P Nissen fundoplication (without gastrostomy tube) procedure 03/03/2022   Nonrheumatic mitral valve regurgitation 11/12/2021   Nonrheumatic tricuspid valve regurgitation 11/12/2021   Palpitations 11/12/2021   Atrial dilatation 05/20/2021   Constipation 05/20/2021   Hearing loss 05/20/2021   Hiatal hernia 05/20/2021   Iron  deficiency anemia 05/20/2021   Lesion of liver 05/20/2021   Obesity 05/20/2021   Osteochondropathy 05/20/2021   Personal history of colonic polyps 05/20/2021   Primary osteoarthritis 05/20/2021   Pulmonary hypertension ([place]) 05/20/2021   Pure hypercholesterolemia 05/20/2021   Vitamin D deficiency 05/20/2021   Laryngopharyngeal reflux (LPR) 05/09/2021    Chronic cough 05/08/2021   Family history of thyroid cancer 05/08/2021   Ascending aorta dilatation (HCC) 04/09/2021   Abnormal echocardiogram 04/09/2021   Biatrial enlargement 04/09/2021   Mild tricuspid regurgitation 04/09/2021   PCP:  Kristen Loader, FNP Pharmacy:   [facility], [place] - [address] AT [facility] [address] [place] [address] Phone: [phone] Fax: [phone]  [facility] [address] [address] Phone: [phone] Fax: [phone]     Social Determinants of Health (SDOH) Social History: SDOH Screenings   Food Insecurity: No Food Insecurity (03/03/2022)  Housing: Low Risk  (03/03/2022)  Transportation Needs: No Transportation Needs (03/03/2022)  Utilities: Not At Risk (03/03/2022)  Depression (PHQ2-9): Low Risk  (07/03/2021)  Tobacco Use: Low Risk  (03/04/2022)   SDOH Interventions:     Readmission Risk Interventions     No data to display

## 2022-03-05 NOTE — Progress Notes (Signed)
2 Days Post-Op   Subjective/Chief Complaint: Pt doing well this AM Some abd soreness Tol PO On heparin gtt On 1L O2   Objective: Vital signs in last 24 hours: Temp:  [98.1 F (36.7 C)-98.8 F (37.1 C)] 98.3 F (36.8 C) (02/01 0909) Pulse Rate:  [80-92] 84 (02/01 0909) Resp:  [17-22] 17 (02/01 0909) BP: (117-156)/(60-90) 144/82 (02/01 0909) SpO2:  [87 %-99 %] 99 % (02/01 0909) Last BM Date : 03/02/22  Intake/Output from previous day: 01/31 0701 - 02/01 0700 In: -  Out: 300 [Urine:300] Intake/Output this shift: No intake/output data recorded.  General appearance: alert and cooperative GI: soft, non-tender; bowel sounds normal; no masses,  no organomegaly and inc c/d/i  Lab Results:  Recent Labs    03/04/22 2247 03/05/22 0526  WBC 7.4 6.7  HGB 12.2 12.4  HCT 36.1 36.7  PLT 174 173   BMET Recent Labs    03/04/22 0216  NA 136  K 4.6  CL 103  CO2 24  GLUCOSE 143*  BUN 10  CREATININE 0.76  CALCIUM 8.6*   PT/INR No results for input(s): "LABPROT", "INR" in the last 72 hours. ABG No results for input(s): "PHART", "HCO3" in the last 72 hours.  Invalid input(s): "PCO2", "PO2"  Studies/Results: CT Angio Chest Pulmonary Embolism (PE) W or WO Contrast  Addendum Date: 03/04/2022   ADDENDUM REPORT: 03/04/2022 14:25 ADDENDUM: Critical Value/emergent results were called by telephone at the time of interpretation on 03/04/2022 at 2:24 pm to provider [facility] , who verbally acknowledged these results. Electronically Signed   By: Marijo Conception M.D.   On: 03/04/2022 14:25   Result Date: 03/04/2022 CLINICAL DATA:  High probability of pulmonary embolism. EXAM: CT ANGIOGRAPHY CHEST WITH CONTRAST TECHNIQUE: Multidetector CT imaging of the chest was performed using the standard protocol during bolus administration of intravenous contrast. Multiplanar CT image reconstructions and MIPs were obtained to evaluate the vascular anatomy. RADIATION DOSE REDUCTION: This exam  was performed according to the departmental dose-optimization program which includes automated exposure control, adjustment of the mA and/or kV according to patient size and/or use of iterative reconstruction technique. CONTRAST:  63m OMNIPAQUE IOHEXOL 350 MG/ML SOLN COMPARISON:  Jun 13, 2021. FINDINGS: Cardiovascular: Linear filling defect is seen in distal left pulmonary artery extending into upper lobe branches consistent with pulmonary embolus. Smaller filling defect is also seen in upper lobe branch of right pulmonary artery. RV LV ratio of 1.9 is noted suggesting right heart strain. No pericardial effusion. Mediastinum/Nodes: Thyroid gland is unremarkable. No adenopathy is noted. Large amount of fluid and gas is noted in the distal esophageal region consistent recent hiatal hernia surgery. Gas is noted in anterior mediastinum as well consistent with recent surgery. Lungs/Pleura: No pneumothorax is noted. Mild bilateral posterior basilar subsegmental atelectasis is noted. Upper Abdomen: No acute abnormality. Musculoskeletal: Stable old midthoracic compression fracture. No acute osseous abnormality is noted. Review of the MIP images confirms the above findings. IMPRESSION: Large linear filling defect seen in distal left pulmonary artery extending into upper lobe branches consistent with acute pulmonary embolus. Smaller filling defect seen in peripheral portion of upper lobe branch of right pulmonary artery. Positive for acute PE with CTevidence of right heart strain (RV/LV Ratio = 1.9) consistent with at least submassive (intermediate risk) PE. The presence of right heart strain has been associated with an increased risk of morbidity and mortality. Large amount of fluid and gas seen in distal esophageal region consistent with recent hiatal hernia surgery. Gas  is also noted in the anterior mediastinum and subcutaneous tissues of the anterior chest wall also most likely due to recent surgery. Mild bilateral  posterior basilar subsegmental atelectasis. Aortic Atherosclerosis (ICD10-I70.0). Electronically Signed: By: Marijo Conception M.D. On: 03/04/2022 14:22   DG ESOPHAGUS W SINGLE CM (SOL OR THIN BA)  Result Date: 03/04/2022 CLINICAL DATA:  Provided history: Status post Nissen fundoplication (without gastrostomy tube) procedure. Additional history obtained from [place] assisted hiatal hernia repair with mesh and Toupet fundoplication on 79/89/2119. EXAM: ESOPHAGUS/BARIUM SWALLOW/TABLET STUDY TECHNIQUE: A problem-oriented water-soluble contrast esophagram was performed to assess for leak or obstruction status post hiatal hernia repair and Toupet fundoplication. The exam was performed by Pasty Spillers, PA-C, and was supervised and interpreted by Dr. Kellie Simmering. FLUOROSCOPY: Fluoroscopy time: 1 minute (9.4 mGy). COMPARISON:  Esophagram 12/22/2021. FINDINGS: A problem-oriented water-soluble contrast esophagram was performed to assess for leak or obstruction status post hiatal hernia repair and Toupet fundoplication. Narrowed appearance of the distal esophagus/GE junction, consistent with the provided history of fundoplication. Mildly delayed passage of contrast from the distal esophagus into the stomach, possibly due to postoperative edema. No extraluminal contrast demonstrated to suggest a leak. Moderate intermittent esophageal dysmotility with tertiary contractions. IMPRESSION: 1. A problem-oriented water-soluble contrast esophagram was performed to assess for leak or obstruction status post hiatal hernia repair and Toupet fundoplication. 2. No extraluminal contrast demonstrated to suggest a leak. 3. Mildly delayed passage of contrast from the distal esophagus into the stomach, possibly due to postoperative edema. 4. Moderate esophageal dysmotility. Electronically Signed   By: Kellie Simmering D.O.   On: 03/04/2022 10:56    Anti-infectives: Anti-infectives (From admission, onward)     Start     Dose/Rate Route Frequency Ordered Stop   03/03/22 0600  ceFAZolin (ANCEF) IVPB 2g/100 mL premix        2 g 200 mL/hr over 30 Minutes Intravenous On call to O.R. 03/03/22 0554 03/03/22 0746       Assessment/Plan: s/p Procedure(s): XI ROBOTIC ASSISTED HIATAL HERNIA REPAIR WITH MESH AND FUNDOPLICATION (N/A) PE  POD 2 -on heparin gtt.  will transition to eliquis at lunch time.  Appreciate Medicine assistance. -con't pureed diet, will add ensure supplements -con't to mobilize, pulm toilet -hopefully home in next 1-2d if can be weaned off O2.     LOS: 0 days    [name] 03/05/2022

## 2022-03-05 NOTE — Progress Notes (Signed)
Hospitalist Consult Progress Note   [name]  FHQ:[id number]  DOB: 1945-12-02  DOA: 03/03/2022  PCP: Kristen Loader, FNP  Requesting provider: Dr. Rosendo Gros Reason for consultation: PE, hypoxia    History of Present Illness: [name] is a 77 y.o. female with past medical history of GERD, AAA, moderate MR/TR, pHTN, HLD admitted for hiatal hernia repair.  After surgery she developed diaphoresis and dizziness with associated hypoxia. She endorsed having some exertional dyspnea at home prior to surgery as well and has been less ambulatory but not dramatically bedbound. She underwent CTA chest after developing her symptoms and was found to have bilateral pulmonary embolisms.  She was started on a heparin drip and oxygen.  Interval History: No events overnight.  Breathing comfortably when seen this morning.  Denies any chest pain or palpitations.  No leg swelling or leg pain.  Echo and duplex had yet to be performed when seen this morning.  Assessment and Plan:  Acute PE -CTA chest shows PE involving left pulmonary artery extending into upper lobe branches along with upper lobe branch of the right pulmonary artery -Follow-up echo and lower extremity duplex -Heparin drip initiated after diagnosis.  Anticoagulation discussed and patient okay transitioning to Eliquis  Right LE DVT - duplex notes age-indeterminate DVT involving right soleal vein - continue eliquis  Acute hypoxic respiratory distress - etiology suspected due to PE; patient developed hypoxia, diaphoresis, dizziness with onset of symptoms - patient ambulated this morning, 2/1 and had O2 desat to 88% on RA -Will need to perform walk test again in morning, if still hypoxic, may need home O2   Past Medical History: Past Medical History:  Diagnosis Date   Anemia    Ascending aorta dilatation (HCC)    Atrial dilatation    Bartholin cyst    Compression fracture    lumbar    Constipation     Disorder of bone density and structure, unspecified    Fatty liver    Hearing loss    History of colon polyps    History of hiatal hernia    History of SCC (squamous cell carcinoma) of skin    Hypertension    Liver lesion    Mild cardiomegaly    Mitral regurgitation    Multinodular goiter    Obesity    Other iron deficiency anemia    Primary osteoarthritis    Pulmonary hypertension ([place])    Pure hypercholesterolemia    Scoliosis    Sensory - neural hearing loss    right   Tremor    Tricuspid regurgitation    Vitamin D deficiency     Past Surgical History: Past Surgical History:  Procedure Laterality Date   APPENDECTOMY     BONE ANCHORED HEARING AID IMPLANT Right 10/10/2014   Procedure: BONE ANCHORED HEARING AID (BAHA) IMPLANT TEMPORAL BONE RIGHT EAR;  Surgeon: Vicie Mutters, MD;  Location: [facility];  Service: ENT;  Laterality: Right;   BREAST REDUCTION SURGERY     CARPAL TUNNEL RELEASE Right    CATARACT EXTRACTION Bilateral    ROTATOR CUFF REPAIR Right    STAPEDECTOMY Bilateral    has metal implants (1 in each ear) - pt cannot have a MRI of her head/brain   UMBILICAL HERNIA REPAIR     XI ROBOTIC ASSISTED HIATAL HERNIA REPAIR N/A 03/03/2022   Procedure: XI ROBOTIC ASSISTED HIATAL HERNIA REPAIR WITH MESH  AND FUNDOPLICATION;  Surgeon: Ralene Ok, MD;  Location: [facility];  Service: General;  Laterality: N/A;    Allergies:  No Known Allergies  Social History:  reports that she has never smoked. She has never used smokeless tobacco. She reports current alcohol use. She reports that she does not use drugs.  Family History: Family History  Problem Relation Age of Onset   Cancer Sister     Objective: Physical Exam: Vitals:   03/04/22 1847 03/04/22 2138 03/05/22 0609 03/05/22 0909  BP: (!) 153/81 (!) 152/89 (!) 145/74 (!) 144/82  Pulse: 85 92 81 84  Resp: '18 18 20 17  '$ Temp: 98.7 F (37.1 C) 98.5 F (36.9 C) 98.2 F (36.8 C) 98.3 F (36.8 C)   TempSrc: Oral Oral Oral Oral  SpO2: 96% 94% 97% 99%  Weight:      Height:       Physical Exam Constitutional:      General: She is not in acute distress.    Appearance: Normal appearance.  HENT:     Head: Normocephalic and atraumatic.     Mouth/Throat:     Mouth: Mucous membranes are moist.  Eyes:     Extraocular Movements: Extraocular movements intact.  Cardiovascular:     Rate and Rhythm: Normal rate and regular rhythm.  Pulmonary:     Effort: Pulmonary effort is normal.     Breath sounds: Normal breath sounds. No wheezing or rhonchi.  Abdominal:     General: Bowel sounds are normal. There is no distension.     Palpations: Abdomen is soft.     Tenderness: There is no abdominal tenderness.  Musculoskeletal:        General: Normal range of motion.     Cervical back: Normal range of motion and neck supple.  Skin:    General: Skin is warm and dry.  Neurological:     General: No focal deficit present.     Mental Status: She is alert.  Psychiatric:        Mood and Affect: Mood normal.     Data reviewed:  I have personally reviewed following labs and imaging studies Results for orders placed or performed during the hospital encounter of 03/03/22 (from the past 48 hour(s))  Basic metabolic panel     Status: Abnormal   Collection Time: 03/04/22  2:16 AM  Result Value Ref Range   Sodium 136 135 - 145 mmol/L   Potassium 4.6 3.5 - 5.1 mmol/L   Chloride 103 98 - 111 mmol/L   CO2 24 22 - 32 mmol/L   Glucose, Bld 143 (H) 70 - 99 mg/dL    Comment: Glucose reference range applies only to samples taken after fasting for at least 8 hours.   BUN 10 8 - 23 mg/dL   Creatinine, Ser 0.76 0.44 - 1.00 mg/dL   Calcium 8.6 (L) 8.9 - 10.3 mg/dL   GFR, Estimated >60 >60 mL/min    Comment: (NOTE) Calculated using the CKD-EPI Creatinine Equation (2021)    Anion gap 9 5 - 15    Comment: Performed at [facility] [address], [place] [address]  CBC     Status: None    Collection Time: 03/04/22  2:16 AM  Result Value Ref Range   WBC 10.1 4.0 - 10.5 K/uL   RBC 4.12 3.87 - 5.11 MIL/uL   Hemoglobin 13.1 12.0 - 15.0 g/dL   HCT 38.6 36.0 - 46.0 %   MCV 93.7 80.0 -  100.0 fL   MCH 31.8 26.0 - 34.0 pg   MCHC 33.9 30.0 - 36.0 g/dL   RDW 12.2 11.5 - 15.5 %   Platelets 194 150 - 400 K/uL   nRBC 0.0 0.0 - 0.2 %    Comment: Performed at [facility], [place] [address], [place], [place] [address]  Troponin I (High Sensitivity)     Status: Abnormal   Collection Time: 03/04/22  3:46 PM  Result Value Ref Range   Troponin I (High Sensitivity) 157 (HH) <18 ng/L    Comment: CRITICAL RESULT CALLED TO, READ BACK BY AND VERIFIED WITH J RAHGLA,RN 1708 03/04/2022 WBOND (NOTE) Elevated high sensitivity troponin I (hsTnI) values and significant  changes across serial measurements may suggest ACS but many other  chronic and acute conditions are known to elevate hsTnI results.  Refer to the "Links" section for chest pain algorithms and additional  guidance. Performed at [facility], [place] [address] [place], [place] [address]   Brain natriuretic peptide     Status: Abnormal   Collection Time: 03/04/22  3:46 PM  Result Value Ref Range   B Natriuretic Peptide 356.4 (H) 0.0 - 100.0 pg/mL    Comment: Performed at [facility] [address], [place], [place] [address]  Troponin I (High Sensitivity)     Status: Abnormal   Collection Time: 03/04/22  5:37 PM  Result Value Ref Range   Troponin I (High Sensitivity) 195 (HH) <18 ng/L    Comment: CRITICAL VALUE NOTED. VALUE IS CONSISTENT WITH PREVIOUSLY REPORTED/CALLED VALUE (NOTE) Elevated high sensitivity troponin I (hsTnI) values and significant  changes across serial measurements may suggest ACS but many other  chronic and acute conditions are known to elevate hsTnI results.  Refer to the "Links" section for chest pain algorithms and additional  guidance. Performed at [facility], [place] [address], [place], [place] [address]   Heparin level (unfractionated)     Status: None   Collection Time: 03/04/22 10:47 PM  Result Value Ref Range   Heparin Unfractionated 0.54 0.30 - 0.70 IU/mL    Comment: (NOTE) The clinical reportable range upper limit is being lowered to >1.10 to align with the FDA approved guidance for the current laboratory assay.  If heparin results are below expected values, and patient dosage has  been confirmed, suggest follow up testing of antithrombin III levels. Performed at [facility], [place] [address], [place], [place] [address]   CBC     Status: Abnormal   Collection Time: 03/04/22 10:47 PM  Result Value Ref Range   WBC 7.4 4.0 - 10.5 K/uL   RBC 3.86 (L) 3.87 - 5.11 MIL/uL   Hemoglobin 12.2 12.0 - 15.0 g/dL   HCT 36.1 36.0 - 46.0 %   MCV 93.5 80.0 - 100.0 fL   MCH 31.6 26.0 - 34.0 pg   MCHC 33.8 30.0 - 36.0 g/dL   RDW 12.2 11.5 - 15.5 %   Platelets 174 150 - 400 K/uL   nRBC 0.0 0.0 - 0.2 %    Comment: Performed at [facility], [place] [address] [place] [address]  CBC     Status: Abnormal   Collection Time: 03/05/22  5:26 AM  Result Value Ref Range   WBC 6.7 4.0 - 10.5 K/uL   RBC 3.86 (L) 3.87 - 5.11 MIL/uL   Hemoglobin 12.4 12.0 - 15.0 g/dL   HCT 36.7 36.0 - 46.0 %  MCV 95.1 80.0 - 100.0 fL   MCH 32.1 26.0 - 34.0 pg   MCHC 33.8 30.0 - 36.0 g/dL   RDW 12.3 11.5 - 15.5 %   Platelets 173 150 - 400 K/uL   nRBC 0.0 0.0 - 0.2 %    Comment: Performed at [facility], [place] [address], [place], [place] [address]  Heparin level (unfractionated)     Status: None   Collection Time: 03/05/22  5:26 AM  Result Value Ref Range   Heparin Unfractionated 0.52 0.30 - 0.70 IU/mL    Comment: (NOTE) The clinical reportable range upper limit is being lowered to >1.10 to align with the FDA approved guidance for the current laboratory assay.  If heparin results are below expected values, and patient dosage has  been confirmed, suggest  follow up testing of antithrombin III levels. Performed at [facility], [place] [address] [place], [place] [address]     Labs:  CBC: Recent Labs  Lab 03/04/22 0216 03/04/22 2247 03/05/22 0526  WBC 10.1 7.4 6.7  HGB 13.1 12.2 12.4  HCT 38.6 36.1 36.7  MCV 93.7 93.5 95.1  PLT 194 174 782    Basic Metabolic Panel: Recent Labs  Lab 03/04/22 0216  NA 136  K 4.6  CL 103  CO2 24  GLUCOSE 143*  BUN 10  CREATININE 0.76  CALCIUM 8.6*   GFR Estimated Creatinine Clearance: 58.2 mL/min (by C-G formula based on SCr of 0.76 mg/dL). Liver Function Tests: No results for input(s): "AST", "ALT", "ALKPHOS", "BILITOT", "PROT", "ALBUMIN" in the last 168 hours. No results for input(s): "LIPASE", "AMYLASE" in the last 168 hours. No results for input(s): "AMMONIA" in the last 168 hours. Coagulation profile No results for input(s): "INR", "PROTIME" in the last 168 hours.  Cardiac Enzymes: No results for input(s): "CKTOTAL", "CKMB", "CKMBINDEX", "TROPONINI" in the last 168 hours. BNP: Invalid input(s): "POCBNP" CBG: No results for input(s): "GLUCAP" in the last 168 hours. D-Dimer No results for input(s): "DDIMER" in the last 72 hours. Hgb A1c No results for input(s): "HGBA1C" in the last 72 hours. Lipid Profile No results for input(s): "CHOL", "HDL", "LDLCALC", "TRIG", "CHOLHDL", "LDLDIRECT" in the last 72 hours. Thyroid function studies No results for input(s): "TSH", "T4TOTAL", "T3FREE", "THYROIDAB" in the last 72 hours.  Invalid input(s): "FREET3" Anemia work up No results for input(s): "VITAMINB12", "FOLATE", "FERRITIN", "TIBC", "IRON", "RETICCTPCT" in the last 72 hours. Urinalysis No results found for: "COLORURINE", "APPEARANCEUR", "LABSPEC", "PHURINE", "GLUCOSEU", "HGBUR", "BILIRUBINUR", "KETONESUR", "PROTEINUR", "UROBILINOGEN", "NITRITE", "LEUKOCYTESUR"  Microbiology No results found for this or any previous visit (from the past 240 hour(s)).  Inpatient Medications:    Scheduled Meds:  apixaban  10 mg Oral BID   Followed by   Derrill Memo ON 03/12/2022] apixaban  5 mg Oral BID   feeding supplement  1 Container Oral TID BM   Ensure Max Protein  11 oz Oral Daily   PRN Meds: HYDROcodone-acetaminophen, HYDROmorphone (DILAUDID) injection, iohexol, ondansetron **OR** ondansetron (ZOFRAN) IV, polyvinyl alcohol Continuous Infusions:  dextrose 5 % and 0.9% NaCl Stopped (03/04/22 1332)    Radiological Exams on Admission: VAS [place] LOWER EXTREMITY VENOUS (DVT)  Result Date: 03/05/2022  Lower Venous DVT Study Patient Name:  VINCENZA DAIL  Date of Exam:   03/05/2022 Medical Rec #: [id number]         Accession #:    [id number] Date of Birth: July 05, 1945         Patient Gender: F Patient Age:   40  years Exam Location:  [facility] Procedure:      VAS [place] LOWER EXTREMITY VENOUS (DVT) Referring Phys: RAJIV PATEL --------------------------------------------------------------------------------  Indications: Pulmonary embolism.  Comparison Study: no prior Performing Technologist: Archie Patten RVS  Examination Guidelines: A complete evaluation includes B-mode imaging, spectral Doppler, color Doppler, and power Doppler as needed of all accessible portions of each vessel. Bilateral testing is considered an integral part of a complete examination. Limited examinations for reoccurring indications may be performed as noted. The reflux portion of the exam is performed with the patient in reverse Trendelenburg.  +---------+---------------+---------+-----------+----------+-----------------+ RIGHT    CompressibilityPhasicitySpontaneityPropertiesThrombus Aging    +---------+---------------+---------+-----------+----------+-----------------+ CFV      Full           Yes      Yes                                    +---------+---------------+---------+-----------+----------+-----------------+ SFJ      Full                                                            +---------+---------------+---------+-----------+----------+-----------------+ FV Prox  Full                                                           +---------+---------------+---------+-----------+----------+-----------------+ FV Mid   Full                                                           +---------+---------------+---------+-----------+----------+-----------------+ FV DistalFull                                                           +---------+---------------+---------+-----------+----------+-----------------+ PFV      Full                                                           +---------+---------------+---------+-----------+----------+-----------------+ POP      Full           Yes      Yes                                    +---------+---------------+---------+-----------+----------+-----------------+ PTV      Full                                                           +---------+---------------+---------+-----------+----------+-----------------+  PERO     Full                                                           +---------+---------------+---------+-----------+----------+-----------------+ Soleal   None                                         Age Indeterminate +---------+---------------+---------+-----------+----------+-----------------+   +---------+---------------+---------+-----------+----------+--------------+ LEFT     CompressibilityPhasicitySpontaneityPropertiesThrombus Aging +---------+---------------+---------+-----------+----------+--------------+ CFV      Full           Yes      Yes                                 +---------+---------------+---------+-----------+----------+--------------+ SFJ      Full                                                        +---------+---------------+---------+-----------+----------+--------------+ FV Prox  Full                                                         +---------+---------------+---------+-----------+----------+--------------+ FV Mid   Full                                                        +---------+---------------+---------+-----------+----------+--------------+ FV DistalFull                                                        +---------+---------------+---------+-----------+----------+--------------+ PFV      Full                                                        +---------+---------------+---------+-----------+----------+--------------+ POP      Full           Yes      Yes                                 +---------+---------------+---------+-----------+----------+--------------+ PTV      Full                                                        +---------+---------------+---------+-----------+----------+--------------+  PERO     Full                                                        +---------+---------------+---------+-----------+----------+--------------+    Summary: RIGHT: - Findings consistent with age indeterminate deep vein thrombosis involving the right soleal veins. - No cystic structure found in the popliteal fossa.  LEFT: - There is no evidence of deep vein thrombosis in the lower extremity.  - No cystic structure found in the popliteal fossa.  *See table(s) above for measurements and observations.    Preliminary    CT Angio Chest Pulmonary Embolism (PE) W or WO Contrast  Addendum Date: 03/04/2022   ADDENDUM REPORT: 03/04/2022 14:25 ADDENDUM: Critical Value/emergent results were called by telephone at the time of interpretation on 03/04/2022 at 2:24 pm to provider [facility] , who verbally acknowledged these results. Electronically Signed   By: Marijo Conception M.D.   On: 03/04/2022 14:25   Result Date: 03/04/2022 CLINICAL DATA:  High probability of pulmonary embolism. EXAM: CT ANGIOGRAPHY CHEST WITH CONTRAST TECHNIQUE: Multidetector CT imaging of the chest was performed using  the standard protocol during bolus administration of intravenous contrast. Multiplanar CT image reconstructions and MIPs were obtained to evaluate the vascular anatomy. RADIATION DOSE REDUCTION: This exam was performed according to the departmental dose-optimization program which includes automated exposure control, adjustment of the mA and/or kV according to patient size and/or use of iterative reconstruction technique. CONTRAST:  79m OMNIPAQUE IOHEXOL 350 MG/ML SOLN COMPARISON:  Jun 13, 2021. FINDINGS: Cardiovascular: Linear filling defect is seen in distal left pulmonary artery extending into upper lobe branches consistent with pulmonary embolus. Smaller filling defect is also seen in upper lobe branch of right pulmonary artery. RV LV ratio of 1.9 is noted suggesting right heart strain. No pericardial effusion. Mediastinum/Nodes: Thyroid gland is unremarkable. No adenopathy is noted. Large amount of fluid and gas is noted in the distal esophageal region consistent recent hiatal hernia surgery. Gas is noted in anterior mediastinum as well consistent with recent surgery. Lungs/Pleura: No pneumothorax is noted. Mild bilateral posterior basilar subsegmental atelectasis is noted. Upper Abdomen: No acute abnormality. Musculoskeletal: Stable old midthoracic compression fracture. No acute osseous abnormality is noted. Review of the MIP images confirms the above findings. IMPRESSION: Large linear filling defect seen in distal left pulmonary artery extending into upper lobe branches consistent with acute pulmonary embolus. Smaller filling defect seen in peripheral portion of upper lobe branch of right pulmonary artery. Positive for acute PE with CTevidence of right heart strain (RV/LV Ratio = 1.9) consistent with at least submassive (intermediate risk) PE. The presence of right heart strain has been associated with an increased risk of morbidity and mortality. Large amount of fluid and gas seen in distal esophageal region  consistent with recent hiatal hernia surgery. Gas is also noted in the anterior mediastinum and subcutaneous tissues of the anterior chest wall also most likely due to recent surgery. Mild bilateral posterior basilar subsegmental atelectasis. Aortic Atherosclerosis (ICD10-I70.0). Electronically Signed: By: JMarijo ConceptionM.D. On: 03/04/2022 14:22   DG ESOPHAGUS W SINGLE CM (SOL OR THIN BA)  Result Date: 03/04/2022 CLINICAL DATA:  Provided history: Status post Nissen fundoplication (without gastrostomy tube) procedure. Additional history obtained from eHendersonassisted hiatal hernia repair with mesh and Toupet fundoplication  on 03/03/2022. EXAM: ESOPHAGUS/BARIUM SWALLOW/TABLET STUDY TECHNIQUE: A problem-oriented water-soluble contrast esophagram was performed to assess for leak or obstruction status post hiatal hernia repair and Toupet fundoplication. The exam was performed by Pasty Spillers, PA-C, and was supervised and interpreted by Dr. Kellie Simmering. FLUOROSCOPY: Fluoroscopy time: 1 minute (9.4 mGy). COMPARISON:  Esophagram 12/22/2021. FINDINGS: A problem-oriented water-soluble contrast esophagram was performed to assess for leak or obstruction status post hiatal hernia repair and Toupet fundoplication. Narrowed appearance of the distal esophagus/GE junction, consistent with the provided history of fundoplication. Mildly delayed passage of contrast from the distal esophagus into the stomach, possibly due to postoperative edema. No extraluminal contrast demonstrated to suggest a leak. Moderate intermittent esophageal dysmotility with tertiary contractions. IMPRESSION: 1. A problem-oriented water-soluble contrast esophagram was performed to assess for leak or obstruction status post hiatal hernia repair and Toupet fundoplication. 2. No extraluminal contrast demonstrated to suggest a leak. 3. Mildly delayed passage of contrast from the distal esophagus into the stomach, possibly due to  postoperative edema. 4. Moderate esophageal dysmotility. Electronically Signed   By: Kellie Simmering D.O.   On: 03/04/2022 10:56    Thank you for this consultation. [facility] hospitalist team will follow the patient with you.   Time spent: Greater than 50% of the 35 minute visit was spent in counseling/coordination of care for the patient as laid out in the A&P.   Dwyane Dee M.D. [facility] 03/05/2022, 2:38 PM Pager: Secure chat

## 2022-03-06 DIAGNOSIS — I82401 Acute embolism and thrombosis of unspecified deep veins of right lower extremity: Secondary | ICD-10-CM | POA: Diagnosis not present

## 2022-03-06 DIAGNOSIS — K449 Diaphragmatic hernia without obstruction or gangrene: Secondary | ICD-10-CM | POA: Diagnosis not present

## 2022-03-06 DIAGNOSIS — I2699 Other pulmonary embolism without acute cor pulmonale: Secondary | ICD-10-CM | POA: Diagnosis not present

## 2022-03-06 NOTE — Progress Notes (Signed)
Hospitalist Consult Progress Note   [name]  QHU:[id number]  DOB: 13-Feb-1945  DOA: 03/03/2022  PCP: Kristen Loader, FNP  Requesting provider: Dr. Rosendo Gros Reason for consultation: PE, hypoxia    History of Present Illness: [name] is a 77 y.o. female with past medical history of GERD, AAA, moderate MR/TR, pHTN, HLD admitted for hiatal hernia repair.  After surgery she developed diaphoresis and dizziness with associated hypoxia. She endorsed having some exertional dyspnea at home prior to surgery as well and has been less ambulatory but not dramatically bedbound. She underwent CTA chest after developing her symptoms and was found to have bilateral pulmonary embolisms.  She was started on a heparin drip and oxygen.  Interval History: No events overnight.  Reviewed echo and LE duplex findings with patient, husband, son this morning.  Still low end of normal on her oxygen saturation while on O2. Still needs to repeat walk test today.  Denies any pleuritic pain, shortness of breath, dizziness. She has not ambulated much and states yesterday she only walked a few steps in the room and was limited by some dyspnea and pain. Informed her, she may need temporary home O2 if still consistently becoming hypoxic with exertion.  Assessment and Plan:  Stable for discharge from medicine standpoint. Issue is whether or not to send home with O2 vs hospitalize longer to see if comes off. Patient obviously preferring no O2 at home.   Acute PE -CTA chest shows PE involving left pulmonary artery extending into upper lobe branches along with upper lobe branch of the right pulmonary artery -Echo negative for right heart strain or failure but does show right ventricular size upper limit of normal due to PE causing elevated PA pressures as expected; EF normal, 55 to 60%, indeterminate diastolic parameters, no RWMA; dilated LA and RA noted.  No mitral stenosis - no bleeding;  continue eliquis  Right LE DVT - duplex notes age-indeterminate DVT involving right soleal vein - continue eliquis  Acute hypoxic respiratory distress - etiology suspected due to PE; patient developed hypoxia, diaphoresis, dizziness with onset of symptoms - patient ambulated 2/1 and had O2 desat to 88% on RA -Will need to perform walk test daily; if still hypoxic, may need home O2   Past Medical History: Past Medical History:  Diagnosis Date   Anemia    Ascending aorta dilatation (HCC)    Atrial dilatation    Bartholin cyst    Compression fracture    lumbar    Constipation    Disorder of bone density and structure, unspecified    Fatty liver    Hearing loss    History of colon polyps    History of hiatal hernia    History of SCC (squamous cell carcinoma) of skin    Hypertension    Liver lesion    Mild cardiomegaly    Mitral regurgitation    Multinodular goiter    Obesity    Other iron deficiency anemia    Primary osteoarthritis    Pulmonary hypertension (HCC)    Pure hypercholesterolemia    Scoliosis    Sensory - neural hearing loss    right   Tremor    Tricuspid regurgitation    Vitamin D deficiency     Past Surgical History: Past Surgical History:  Procedure Laterality Date   APPENDECTOMY     BONE ANCHORED HEARING AID IMPLANT Right 10/10/2014  Procedure: BONE ANCHORED HEARING AID (BAHA) IMPLANT TEMPORAL BONE RIGHT EAR;  Surgeon: Vicie Mutters, MD;  Location: [facility];  Service: ENT;  Laterality: Right;   BREAST REDUCTION SURGERY     CARPAL TUNNEL RELEASE Right    CATARACT EXTRACTION Bilateral    ROTATOR CUFF REPAIR Right    STAPEDECTOMY Bilateral    has metal implants (1 in each ear) - pt cannot have a MRI of her head/brain   UMBILICAL HERNIA REPAIR     XI ROBOTIC ASSISTED HIATAL HERNIA REPAIR N/A 03/03/2022   Procedure: XI ROBOTIC ASSISTED HIATAL HERNIA REPAIR WITH MESH AND FUNDOPLICATION;  Surgeon: Ralene Ok, MD;  Location: [facility];   Service: General;  Laterality: N/A;    Allergies:  No Known Allergies  Social History:  reports that she has never smoked. She has never used smokeless tobacco. She reports current alcohol use. She reports that she does not use drugs.  Family History: Family History  Problem Relation Age of Onset   Cancer Sister     Objective: Physical Exam: Vitals:   03/05/22 1507 03/05/22 2128 03/06/22 0437 03/06/22 0815  BP: (!) 142/77 (!) 146/93 (!) 148/80 (!) 151/78  Pulse: 77 83 89 82  Resp: '17 18 18 17  '$ Temp: 97.7 F (36.5 C) 99.2 F (37.3 C) 99.1 F (37.3 C) 98.3 F (36.8 C)  TempSrc: Oral Oral Oral Oral  SpO2: 93% 93% 92% 93%  Weight:      Height:       Physical Exam Constitutional:      General: She is not in acute distress.    Appearance: Normal appearance.  HENT:     Head: Normocephalic and atraumatic.     Mouth/Throat:     Mouth: Mucous membranes are moist.  Eyes:     Extraocular Movements: Extraocular movements intact.  Cardiovascular:     Rate and Rhythm: Normal rate and regular rhythm.  Pulmonary:     Effort: Pulmonary effort is normal.     Breath sounds: Normal breath sounds. No wheezing or rhonchi.  Abdominal:     General: Bowel sounds are normal. There is no distension.     Palpations: Abdomen is soft.     Tenderness: There is no abdominal tenderness.  Musculoskeletal:        General: Normal range of motion.     Cervical back: Normal range of motion and neck supple.  Skin:    General: Skin is warm and dry.  Neurological:     General: No focal deficit present.     Mental Status: She is alert.  Psychiatric:        Mood and Affect: Mood normal.     Data reviewed:  I have personally reviewed following labs and imaging studies Results for orders placed or performed during the hospital encounter of 03/03/22 (from the past 48 hour(s))  Troponin I (High Sensitivity)     Status: Abnormal   Collection Time: 03/04/22  3:46 PM  Result Value Ref Range    Troponin I (High Sensitivity) 157 (HH) <18 ng/L    Comment: CRITICAL RESULT CALLED TO, READ BACK BY AND VERIFIED WITH J RAHGLA,RN 1708 03/04/2022 WBOND (NOTE) Elevated high sensitivity troponin I (hsTnI) values and significant  changes across serial measurements may suggest ACS but many other  chronic and acute conditions are known to elevate hsTnI results.  Refer to the "Links" section for chest pain algorithms and additional  guidance. Performed at [facility], [place]  [address], [place], [place] [address]   Brain natriuretic peptide     Status: Abnormal   Collection Time: 03/04/22  3:46 PM  Result Value Ref Range   B Natriuretic Peptide 356.4 (H) 0.0 - 100.0 pg/mL    Comment: Performed at [facility] [address], [place], [place] [address]  Troponin I (High Sensitivity)     Status: Abnormal   Collection Time: 03/04/22  5:37 PM  Result Value Ref Range   Troponin I (High Sensitivity) 195 (HH) <18 ng/L    Comment: CRITICAL VALUE NOTED. VALUE IS CONSISTENT WITH PREVIOUSLY REPORTED/CALLED VALUE (NOTE) Elevated high sensitivity troponin I (hsTnI) values and significant  changes across serial measurements may suggest ACS but many other  chronic and acute conditions are known to elevate hsTnI results.  Refer to the "Links" section for chest pain algorithms and additional  guidance. Performed at [facility], [place] [address], [place], [place] [address]   Heparin level (unfractionated)     Status: None   Collection Time: 03/04/22 10:47 PM  Result Value Ref Range   Heparin Unfractionated 0.54 0.30 - 0.70 IU/mL    Comment: (NOTE) The clinical reportable range upper limit is being lowered to >1.10 to align with the FDA approved guidance for the current laboratory assay.  If heparin results are below expected values, and patient dosage has  been confirmed, suggest follow up testing of antithrombin III levels. Performed at [facility], [place] [address],  [place], [place] [address]   CBC     Status: Abnormal   Collection Time: 03/04/22 10:47 PM  Result Value Ref Range   WBC 7.4 4.0 - 10.5 K/uL   RBC 3.86 (L) 3.87 - 5.11 MIL/uL   Hemoglobin 12.2 12.0 - 15.0 g/dL   HCT 36.1 36.0 - 46.0 %   MCV 93.5 80.0 - 100.0 fL   MCH 31.6 26.0 - 34.0 pg   MCHC 33.8 30.0 - 36.0 g/dL   RDW 12.2 11.5 - 15.5 %   Platelets 174 150 - 400 K/uL   nRBC 0.0 0.0 - 0.2 %    Comment: Performed at [facility], [place] [address] [place], [place] [address]  CBC     Status: Abnormal   Collection Time: 03/05/22  5:26 AM  Result Value Ref Range   WBC 6.7 4.0 - 10.5 K/uL   RBC 3.86 (L) 3.87 - 5.11 MIL/uL   Hemoglobin 12.4 12.0 - 15.0 g/dL   HCT 36.7 36.0 - 46.0 %   MCV 95.1 80.0 - 100.0 fL   MCH 32.1 26.0 - 34.0 pg   MCHC 33.8 30.0 - 36.0 g/dL   RDW 12.3 11.5 - 15.5 %   Platelets 173 150 - 400 K/uL   nRBC 0.0 0.0 - 0.2 %    Comment: Performed at [facility], [place] [address] [place], [place] [address]  Heparin level (unfractionated)     Status: None   Collection Time: 03/05/22  5:26 AM  Result Value Ref Range   Heparin Unfractionated 0.52 0.30 - 0.70 IU/mL    Comment: (NOTE) The clinical reportable range upper limit is being lowered to >1.10 to align with the FDA approved guidance for the current laboratory assay.  If heparin results are below expected values, and patient dosage has  been confirmed, suggest follow up testing of antithrombin III levels. Performed at [facility], [place] [address], [place], [place] [address]     Labs:  CBC: Recent Labs  Lab 03/04/22 0216 03/04/22 2247 03/05/22 0526  WBC 10.1 7.4 6.7  HGB 13.1 12.2 12.4  HCT 38.6 36.1 36.7  MCV 93.7 93.5 95.1  PLT 194 174 173     Basic Metabolic Panel: Recent Labs  Lab 03/04/22 0216  NA 136  K 4.6  CL 103  CO2 24  GLUCOSE 143*  BUN 10  CREATININE 0.76  CALCIUM 8.6*    GFR Estimated Creatinine Clearance: 58.2 mL/min (by C-G formula based on SCr of 0.76  mg/dL). Liver Function Tests: No results for input(s): "AST", "ALT", "ALKPHOS", "BILITOT", "PROT", "ALBUMIN" in the last 168 hours. No results for input(s): "LIPASE", "AMYLASE" in the last 168 hours. No results for input(s): "AMMONIA" in the last 168 hours. Coagulation profile No results for input(s): "INR", "PROTIME" in the last 168 hours.  Cardiac Enzymes: No results for input(s): "CKTOTAL", "CKMB", "CKMBINDEX", "TROPONINI" in the last 168 hours. BNP: Invalid input(s): "POCBNP" CBG: No results for input(s): "GLUCAP" in the last 168 hours. D-Dimer No results for input(s): "DDIMER" in the last 72 hours. Hgb A1c No results for input(s): "HGBA1C" in the last 72 hours. Lipid Profile No results for input(s): "CHOL", "HDL", "LDLCALC", "TRIG", "CHOLHDL", "LDLDIRECT" in the last 72 hours. Thyroid function studies No results for input(s): "TSH", "T4TOTAL", "T3FREE", "THYROIDAB" in the last 72 hours.  Invalid input(s): "FREET3" Anemia work up No results for input(s): "VITAMINB12", "FOLATE", "FERRITIN", "TIBC", "IRON", "RETICCTPCT" in the last 72 hours. Urinalysis No results found for: "COLORURINE", "APPEARANCEUR", "LABSPEC", "PHURINE", "GLUCOSEU", "HGBUR", "BILIRUBINUR", "KETONESUR", "PROTEINUR", "UROBILINOGEN", "NITRITE", "LEUKOCYTESUR"  Microbiology No results found for this or any previous visit (from the past 240 hour(s)).  Inpatient Medications:   Scheduled Meds:  apixaban  10 mg Oral BID   Followed by   Derrill Memo ON 03/12/2022] apixaban  5 mg Oral BID   feeding supplement  1 Container Oral TID BM   Ensure Max Protein  11 oz Oral Daily   PRN Meds: HYDROcodone-acetaminophen, HYDROmorphone (DILAUDID) injection, iohexol, ondansetron **OR** ondansetron (ZOFRAN) IV, polyvinyl alcohol Continuous Infusions:  dextrose 5 % and 0.9% NaCl Stopped (03/04/22 1332)    Radiological Exams on Admission: VAS [place] LOWER EXTREMITY VENOUS (DVT)  Result Date: 03/06/2022  Lower Venous DVT Study Patient  Name:  [name]  Date of Exam:   03/05/2022 Medical Rec #: [id number]         Accession #:    [id number] Date of Birth: 1945/10/22         Patient Gender: F Patient Age:   25 years Exam Location:  [facility] Procedure:      VAS [place] LOWER EXTREMITY VENOUS (DVT) Referring Phys: RAJIV PATEL --------------------------------------------------------------------------------  Indications: Pulmonary embolism.  Comparison Study: no prior Performing Technologist: Archie Patten RVS  Examination Guidelines: A complete evaluation includes B-mode imaging, spectral Doppler, color Doppler, and power Doppler as needed of all accessible portions of each vessel. Bilateral testing is considered an integral part of a complete examination. Limited examinations for reoccurring indications may be performed as noted. The reflux portion of the exam is performed with the patient in reverse Trendelenburg.  +---------+---------------+---------+-----------+----------+-----------------+ RIGHT    CompressibilityPhasicitySpontaneityPropertiesThrombus Aging    +---------+---------------+---------+-----------+----------+-----------------+ CFV      Full           Yes      Yes                                    +---------+---------------+---------+-----------+----------+-----------------+  SFJ      Full                                                           +---------+---------------+---------+-----------+----------+-----------------+ FV Prox  Full                                                           +---------+---------------+---------+-----------+----------+-----------------+ FV Mid   Full                                                           +---------+---------------+---------+-----------+----------+-----------------+ FV DistalFull                                                           +---------+---------------+---------+-----------+----------+-----------------+ PFV      Full                                                            +---------+---------------+---------+-----------+----------+-----------------+ POP      Full           Yes      Yes                                    +---------+---------------+---------+-----------+----------+-----------------+ PTV      Full                                                           +---------+---------------+---------+-----------+----------+-----------------+ PERO     Full                                                           +---------+---------------+---------+-----------+----------+-----------------+ Soleal   None                                         Age Indeterminate +---------+---------------+---------+-----------+----------+-----------------+   +---------+---------------+---------+-----------+----------+--------------+ LEFT     CompressibilityPhasicitySpontaneityPropertiesThrombus Aging +---------+---------------+---------+-----------+----------+--------------+ CFV      Full           Yes      Yes                                 +---------+---------------+---------+-----------+----------+--------------+  SFJ      Full                                                        +---------+---------------+---------+-----------+----------+--------------+ FV Prox  Full                                                        +---------+---------------+---------+-----------+----------+--------------+ FV Mid   Full                                                        +---------+---------------+---------+-----------+----------+--------------+ FV DistalFull                                                        +---------+---------------+---------+-----------+----------+--------------+ PFV      Full                                                        +---------+---------------+---------+-----------+----------+--------------+ POP      Full           Yes       Yes                                 +---------+---------------+---------+-----------+----------+--------------+ PTV      Full                                                        +---------+---------------+---------+-----------+----------+--------------+ PERO     Full                                                        +---------+---------------+---------+-----------+----------+--------------+     Summary: RIGHT: - Findings consistent with age indeterminate deep vein thrombosis involving the right soleal veins. - No cystic structure found in the popliteal fossa.  LEFT: - There is no evidence of deep vein thrombosis in the lower extremity.  - No cystic structure found in the popliteal fossa.  *See table(s) above for measurements and observations. Electronically signed by Jamelle Haring on 03/06/2022 at 7:28:10 AM.    Final    ECHOCARDIOGRAM COMPLETE  Result Date: 03/05/2022    ECHOCARDIOGRAM REPORT   Patient Name:   [name] Date of Exam: 03/05/2022 Medical Rec #:  [id number]  Height:       64.0 in Accession #:    [id number]       Weight:       176.0 lb Date of Birth:  Sep 18, 1945        BSA:          1.853 m Patient Age:    60 years         BP:           144/82 mmHg Patient Gender: F                HR:           81 bpm. Exam Location:  Inpatient Procedure: 2D Echo, 3D Echo, Cardiac Doppler and Color Doppler Indications:     Pulmonary Embolus I26.09  History:         Patient has no prior history of Echocardiogram examinations.                  Pulmonary HTN, Mitral Valve Disease; Risk Factors:Non-Smoker                  and Hypertension.  Sonographer:     Greer Pickerel Referring Phys:  [id number] Randell Loop PATEL Diagnosing Phys: Vernell Leep MD  Sonographer Comments: Image acquisition challenging due to patient body habitus and Image acquisition challenging due to respiratory motion. IMPRESSIONS  1. Left ventricular ejection fraction, by estimation, is 55 to 60%. The left ventricle has  normal function. The left ventricle has no regional wall motion abnormalities. Left ventricular diastolic parameters are indeterminate.  2. Right ventricular systolic function is normal. The right ventricular size is upper limit normal. There is moderately elevated pulmonary artery systolic pressure.  3. Left atrial size was moderately dilated.  4. Right atrial size was moderately dilated.  5. The mitral valve is normal in structure. Mild mitral valve regurgitation. No evidence of mitral stenosis.  6. Tricuspid valve regurgitation is moderate.  7. The aortic valve is normal in structure. Aortic valve regurgitation is not visualized. No aortic stenosis is present.  8. The inferior vena cava is dilated in size with >50% respiratory variability, suggesting right atrial pressure of 8 mmHg. Comparison(s): Compared to outpatient study in 11/2021, estimated PASP increased from 30 mmHg. FINDINGS  Left Ventricle: Left ventricular ejection fraction, by estimation, is 55 to 60%. The left ventricle has normal function. The left ventricle has no regional wall motion abnormalities. The left ventricular internal cavity size was normal in size. There is  no left ventricular hypertrophy. Left ventricular diastolic parameters are indeterminate. Right Ventricle: The right ventricular size is upper limit normal. No increase in right ventricular wall thickness. Right ventricular systolic function is normal. There is moderately elevated pulmonary artery systolic pressure. The tricuspid regurgitant velocity is 3.36 m/s, and with an assumed right atrial pressure of 8 mmHg, the estimated right ventricular systolic pressure is 85.2 mmHg. Left Atrium: Left atrial size was moderately dilated. Right Atrium: Right atrial size was moderately dilated. Pericardium: Echogenic structure in liver parenchyma, possible a cyst. There is no evidence of pericardial effusion. Mitral Valve: The mitral valve is normal in structure. Mild mitral valve  regurgitation. No evidence of mitral valve stenosis. Tricuspid Valve: The tricuspid valve is grossly normal. Tricuspid valve regurgitation is moderate. Aortic Valve: The aortic valve is normal in structure. Aortic valve regurgitation is not visualized. No aortic stenosis is present. Pulmonic Valve: The pulmonic valve was grossly normal. Pulmonic valve regurgitation is mild. Aorta: The aortic root  is normal in size and structure. Venous: The inferior vena cava is dilated in size with greater than 50% respiratory variability, suggesting right atrial pressure of 8 mmHg. IAS/Shunts: The interatrial septum was not assessed.  LEFT VENTRICLE PLAX 2D LVIDd:         4.10 cm   Diastology LVIDs:         2.80 cm   LV e' medial:    7.29 cm/s LV PW:         0.90 cm   LV E/e' medial:  11.7 LV IVS:        0.90 cm   LV e' lateral:   11.10 cm/s LVOT diam:     1.80 cm   LV E/e' lateral: 7.7 LV SV:         62 LV SV Index:   34 LVOT Area:     2.54 cm  RIGHT VENTRICLE RV S prime:     25.60 cm/s TAPSE (M-mode): 1.8 cm LEFT ATRIUM              Index        RIGHT ATRIUM           Index LA diam:        3.50 cm  1.89 cm/m   RA Area:     26.40 cm LA Vol (A2C):   70.0 ml  37.78 ml/m  RA Volume:   86.00 ml  46.41 ml/m LA Vol (A4C):   132.0 ml 71.24 ml/m LA Biplane Vol: 101.0 ml 54.51 ml/m  AORTIC VALVE             PULMONIC VALVE LVOT Vmax:   122.00 cm/s PR End Diast Vel: 6.97 msec LVOT Vmean:  79.800 cm/s LVOT VTI:    0.244 m  AORTA Ao Root diam: 3.20 cm Ao Asc diam:  3.40 cm MITRAL VALVE               TRICUSPID VALVE MV Area (PHT): 3.33 cm    TR Peak grad:   45.2 mmHg MV Decel Time: 228 msec    TR Vmax:        336.00 cm/s MV E velocity: 85.40 cm/s MV A velocity: 78.60 cm/s  SHUNTS MV E/A ratio:  1.09        Systemic VTI:  0.24 m                            Systemic Diam: 1.80 cm Vernell Leep MD Electronically signed by Vernell Leep MD Signature Date/Time: 03/05/2022/5:01:17 PM    Final    CT Angio Chest Pulmonary Embolism (PE)  W or WO Contrast  Addendum Date: 03/04/2022   ADDENDUM REPORT: 03/04/2022 14:25 ADDENDUM: Critical Value/emergent results were called by telephone at the time of interpretation on 03/04/2022 at 2:24 pm to provider [facility] , who verbally acknowledged these results. Electronically Signed   By: Marijo Conception M.D.   On: 03/04/2022 14:25   Result Date: 03/04/2022 CLINICAL DATA:  High probability of pulmonary embolism. EXAM: CT ANGIOGRAPHY CHEST WITH CONTRAST TECHNIQUE: Multidetector CT imaging of the chest was performed using the standard protocol during bolus administration of intravenous contrast. Multiplanar CT image reconstructions and MIPs were obtained to evaluate the vascular anatomy. RADIATION DOSE REDUCTION: This exam was performed according to the departmental dose-optimization program which includes automated exposure control, adjustment of the mA and/or kV according to patient size and/or use of iterative reconstruction technique.  CONTRAST:  12m OMNIPAQUE IOHEXOL 350 MG/ML SOLN COMPARISON:  Jun 13, 2021. FINDINGS: Cardiovascular: Linear filling defect is seen in distal left pulmonary artery extending into upper lobe branches consistent with pulmonary embolus. Smaller filling defect is also seen in upper lobe branch of right pulmonary artery. RV LV ratio of 1.9 is noted suggesting right heart strain. No pericardial effusion. Mediastinum/Nodes: Thyroid gland is unremarkable. No adenopathy is noted. Large amount of fluid and gas is noted in the distal esophageal region consistent recent hiatal hernia surgery. Gas is noted in anterior mediastinum as well consistent with recent surgery. Lungs/Pleura: No pneumothorax is noted. Mild bilateral posterior basilar subsegmental atelectasis is noted. Upper Abdomen: No acute abnormality. Musculoskeletal: Stable old midthoracic compression fracture. No acute osseous abnormality is noted. Review of the MIP images confirms the above findings. IMPRESSION: Large  linear filling defect seen in distal left pulmonary artery extending into upper lobe branches consistent with acute pulmonary embolus. Smaller filling defect seen in peripheral portion of upper lobe branch of right pulmonary artery. Positive for acute PE with CTevidence of right heart strain (RV/LV Ratio = 1.9) consistent with at least submassive (intermediate risk) PE. The presence of right heart strain has been associated with an increased risk of morbidity and mortality. Large amount of fluid and gas seen in distal esophageal region consistent with recent hiatal hernia surgery. Gas is also noted in the anterior mediastinum and subcutaneous tissues of the anterior chest wall also most likely due to recent surgery. Mild bilateral posterior basilar subsegmental atelectasis. Aortic Atherosclerosis (ICD10-I70.0). Electronically Signed: By: JMarijo ConceptionM.D. On: 03/04/2022 14:22    Thank you for this consultation. THoly [facility] the patient with you.   Time spent: Greater than 50% of the 35 minute visit was spent in counseling/coordination of care for the patient as laid out in the A&P.   DDwyane DeeM.D. [facility] 03/06/2022, 1:04 PM Pager: Secure chat

## 2022-03-06 NOTE — Progress Notes (Signed)
3 Days Post-Op   Subjective/Chief Complaint: Doing well this  Got up some yesterday, still with some soreness [place] and Echo reviewed on eliquis   Objective: Vital signs in last 24 hours: Temp:  [97.7 F (36.5 C)-99.2 F (37.3 C)] 99.1 F (37.3 C) (02/02 0437) Pulse Rate:  [77-89] 89 (02/02 0437) Resp:  [17-18] 18 (02/02 0437) BP: (142-148)/(77-93) 148/80 (02/02 0437) SpO2:  [92 %-99 %] 92 % (02/02 0437) Last BM Date : 03/02/22  Intake/Output from previous day: 02/01 0701 - 02/02 0700 In: 360 [P.O.:360] Out: -  Intake/Output this shift: No intake/output data recorded.  General appearance: alert and cooperative GI: soft, non-tender; bowel sounds normal; no masses,  no organomegaly and inc c/d/i  Lab Results:  Recent Labs    03/04/22 2247 03/05/22 0526  WBC 7.4 6.7  HGB 12.2 12.4  HCT 36.1 36.7  PLT 174 173   BMET Recent Labs    03/04/22 0216  NA 136  K 4.6  CL 103  CO2 24  GLUCOSE 143*  BUN 10  CREATININE 0.76  CALCIUM 8.6*   PT/INR No results for input(s): "LABPROT", "INR" in the last 72 hours. ABG No results for input(s): "PHART", "HCO3" in the last 72 hours.  Invalid input(s): "PCO2", "PO2"  Studies/Results: VAS [place] LOWER EXTREMITY VENOUS (DVT)  Result Date: 03/06/2022  Lower Venous DVT Study Patient Name:  [name]  Date of Exam:   03/05/2022 Medical Rec #: [id number]         Accession #:    [id number] Date of Birth: [birth year]         Patient Gender: F Patient Age:   77 years Exam Location:  [facility] Procedure:      VAS [place] LOWER EXTREMITY VENOUS (DVT) Referring Phys: RAJIV PATEL --------------------------------------------------------------------------------  Indications: Pulmonary embolism.  Comparison Study: no prior Performing Technologist: Archie Patten RVS  Examination Guidelines: A complete evaluation includes B-mode imaging, spectral Doppler, color Doppler, and power Doppler as needed of all accessible portions of each vessel.  Bilateral testing is considered an integral part of a complete examination. Limited examinations for reoccurring indications may be performed as noted. The reflux portion of the exam is performed with the patient in reverse Trendelenburg.  +---------+---------------+---------+-----------+----------+-----------------+ RIGHT    CompressibilityPhasicitySpontaneityPropertiesThrombus Aging    +---------+---------------+---------+-----------+----------+-----------------+ CFV      Full           Yes      Yes                                    +---------+---------------+---------+-----------+----------+-----------------+ SFJ      Full                                                           +---------+---------------+---------+-----------+----------+-----------------+ FV Prox  Full                                                           +---------+---------------+---------+-----------+----------+-----------------+ FV Mid   Full                                                           +---------+---------------+---------+-----------+----------+-----------------+  FV DistalFull                                                           +---------+---------------+---------+-----------+----------+-----------------+ PFV      Full                                                           +---------+---------------+---------+-----------+----------+-----------------+ POP      Full           Yes      Yes                                    +---------+---------------+---------+-----------+----------+-----------------+ PTV      Full                                                           +---------+---------------+---------+-----------+----------+-----------------+ PERO     Full                                                           +---------+---------------+---------+-----------+----------+-----------------+ Soleal   None                                          Age Indeterminate +---------+---------------+---------+-----------+----------+-----------------+   +---------+---------------+---------+-----------+----------+--------------+ LEFT     CompressibilityPhasicitySpontaneityPropertiesThrombus Aging +---------+---------------+---------+-----------+----------+--------------+ CFV      Full           Yes      Yes                                 +---------+---------------+---------+-----------+----------+--------------+ SFJ      Full                                                        +---------+---------------+---------+-----------+----------+--------------+ FV Prox  Full                                                        +---------+---------------+---------+-----------+----------+--------------+ FV Mid   Full                                                        +---------+---------------+---------+-----------+----------+--------------+  FV DistalFull                                                        +---------+---------------+---------+-----------+----------+--------------+ PFV      Full                                                        +---------+---------------+---------+-----------+----------+--------------+ POP      Full           Yes      Yes                                 +---------+---------------+---------+-----------+----------+--------------+ PTV      Full                                                        +---------+---------------+---------+-----------+----------+--------------+ PERO     Full                                                        +---------+---------------+---------+-----------+----------+--------------+     Summary: RIGHT: - Findings consistent with age indeterminate deep vein thrombosis involving the right soleal veins. - No cystic structure found in the popliteal fossa.  LEFT: - There is no evidence of deep vein thrombosis in the lower  extremity.  - No cystic structure found in the popliteal fossa.  *See table(s) above for measurements and observations. Electronically signed by Jamelle Haring on 03/06/2022 at 7:28:10 AM.    Final    ECHOCARDIOGRAM COMPLETE  Result Date: 03/05/2022    ECHOCARDIOGRAM REPORT   Patient Name:   [name] Date of Exam: 03/05/2022 Medical Rec #:  [id number]        Height:       64.0 in Accession #:    [id number]       Weight:       176.0 lb Date of Birth:  1945-10-12        BSA:          1.853 m Patient Age:    37 years         BP:           144/82 mmHg Patient Gender: F                HR:           81 bpm. Exam Location:  Inpatient Procedure: 2D Echo, 3D Echo, Cardiac Doppler and Color Doppler Indications:     Pulmonary Embolus I26.09  History:         Patient has no prior history of Echocardiogram examinations.                  Pulmonary HTN, Mitral Valve Disease; Risk Factors:Non-Smoker  and Hypertension.  Sonographer:     Greer Pickerel Referring Phys:  [id number] Randell Loop PATEL Diagnosing Phys: Vernell Leep MD  Sonographer Comments: Image acquisition challenging due to patient body habitus and Image acquisition challenging due to respiratory motion. IMPRESSIONS  1. Left ventricular ejection fraction, by estimation, is 55 to 60%. The left ventricle has normal function. The left ventricle has no regional wall motion abnormalities. Left ventricular diastolic parameters are indeterminate.  2. Right ventricular systolic function is normal. The right ventricular size is upper limit normal. There is moderately elevated pulmonary artery systolic pressure.  3. Left atrial size was moderately dilated.  4. Right atrial size was moderately dilated.  5. The mitral valve is normal in structure. Mild mitral valve regurgitation. No evidence of mitral stenosis.  6. Tricuspid valve regurgitation is moderate.  7. The aortic valve is normal in structure. Aortic valve regurgitation is not visualized. No aortic  stenosis is present.  8. The inferior vena cava is dilated in size with >50% respiratory variability, suggesting right atrial pressure of 8 mmHg. Comparison(s): Compared to outpatient study in 11/2021, estimated PASP increased from 30 mmHg. FINDINGS  Left Ventricle: Left ventricular ejection fraction, by estimation, is 55 to 60%. The left ventricle has normal function. The left ventricle has no regional wall motion abnormalities. The left ventricular internal cavity size was normal in size. There is  no left ventricular hypertrophy. Left ventricular diastolic parameters are indeterminate. Right Ventricle: The right ventricular size is upper limit normal. No increase in right ventricular wall thickness. Right ventricular systolic function is normal. There is moderately elevated pulmonary artery systolic pressure. The tricuspid regurgitant velocity is 3.36 m/s, and with an assumed right atrial pressure of 8 mmHg, the estimated right ventricular systolic pressure is 74.2 mmHg. Left Atrium: Left atrial size was moderately dilated. Right Atrium: Right atrial size was moderately dilated. Pericardium: Echogenic structure in liver parenchyma, possible a cyst. There is no evidence of pericardial effusion. Mitral Valve: The mitral valve is normal in structure. Mild mitral valve regurgitation. No evidence of mitral valve stenosis. Tricuspid Valve: The tricuspid valve is grossly normal. Tricuspid valve regurgitation is moderate. Aortic Valve: The aortic valve is normal in structure. Aortic valve regurgitation is not visualized. No aortic stenosis is present. Pulmonic Valve: The pulmonic valve was grossly normal. Pulmonic valve regurgitation is mild. Aorta: The aortic root is normal in size and structure. Venous: The inferior vena cava is dilated in size with greater than 50% respiratory variability, suggesting right atrial pressure of 8 mmHg. IAS/Shunts: The interatrial septum was not assessed.  LEFT VENTRICLE PLAX 2D LVIDd:          4.10 cm   Diastology LVIDs:         2.80 cm   LV e' medial:    7.29 cm/s LV PW:         0.90 cm   LV E/e' medial:  11.7 LV IVS:        0.90 cm   LV e' lateral:   11.10 cm/s LVOT diam:     1.80 cm   LV E/e' lateral: 7.7 LV SV:         62 LV SV Index:   34 LVOT Area:     2.54 cm  RIGHT VENTRICLE RV S prime:     25.60 cm/s TAPSE (M-mode): 1.8 cm LEFT ATRIUM              Index        RIGHT ATRIUM  Index LA diam:        3.50 cm  1.89 cm/m   RA Area:     26.40 cm LA Vol (A2C):   70.0 ml  37.78 ml/m  RA Volume:   86.00 ml  46.41 ml/m LA Vol (A4C):   132.0 ml 71.24 ml/m LA Biplane Vol: 101.0 ml 54.51 ml/m  AORTIC VALVE             PULMONIC VALVE LVOT Vmax:   122.00 cm/s PR End Diast Vel: 6.97 msec LVOT Vmean:  79.800 cm/s LVOT VTI:    0.244 m  AORTA Ao Root diam: 3.20 cm Ao Asc diam:  3.40 cm MITRAL VALVE               TRICUSPID VALVE MV Area (PHT): 3.33 cm    TR Peak grad:   45.2 mmHg MV Decel Time: 228 msec    TR Vmax:        336.00 cm/s MV E velocity: 85.40 cm/s MV A velocity: 78.60 cm/s  SHUNTS MV E/A ratio:  1.09        Systemic VTI:  0.24 m                            Systemic Diam: 1.80 cm Vernell Leep MD Electronically signed by Vernell Leep MD Signature Date/Time: 03/05/2022/5:01:17 PM    Final    CT Angio Chest Pulmonary Embolism (PE) W or WO Contrast  Addendum Date: 03/04/2022   ADDENDUM REPORT: 03/04/2022 14:25 ADDENDUM: Critical Value/emergent results were called by telephone at the time of interpretation on 03/04/2022 at 2:24 pm to provider [facility] , who verbally acknowledged these results. Electronically Signed   By: Marijo Conception M.D.   On: 03/04/2022 14:25   Result Date: 03/04/2022 CLINICAL DATA:  High probability of pulmonary embolism. EXAM: CT ANGIOGRAPHY CHEST WITH CONTRAST TECHNIQUE: Multidetector CT imaging of the chest was performed using the standard protocol during bolus administration of intravenous contrast. Multiplanar CT image reconstructions and MIPs  were obtained to evaluate the vascular anatomy. RADIATION DOSE REDUCTION: This exam was performed according to the departmental dose-optimization program which includes automated exposure control, adjustment of the mA and/or kV according to patient size and/or use of iterative reconstruction technique. CONTRAST:  17m OMNIPAQUE IOHEXOL 350 MG/ML SOLN COMPARISON:  Jun 13, 2021. FINDINGS: Cardiovascular: Linear filling defect is seen in distal left pulmonary artery extending into upper lobe branches consistent with pulmonary embolus. Smaller filling defect is also seen in upper lobe branch of right pulmonary artery. RV LV ratio of 1.9 is noted suggesting right heart strain. No pericardial effusion. Mediastinum/Nodes: Thyroid gland is unremarkable. No adenopathy is noted. Large amount of fluid and gas is noted in the distal esophageal region consistent recent hiatal hernia surgery. Gas is noted in anterior mediastinum as well consistent with recent surgery. Lungs/Pleura: No pneumothorax is noted. Mild bilateral posterior basilar subsegmental atelectasis is noted. Upper Abdomen: No acute abnormality. Musculoskeletal: Stable old midthoracic compression fracture. No acute osseous abnormality is noted. Review of the MIP images confirms the above findings. IMPRESSION: Large linear filling defect seen in distal left pulmonary artery extending into upper lobe branches consistent with acute pulmonary embolus. Smaller filling defect seen in peripheral portion of upper lobe branch of right pulmonary artery. Positive for acute PE with CTevidence of right heart strain (RV/LV Ratio = 1.9) consistent with at least submassive (intermediate risk) PE. The presence of right heart strain  has been associated with an increased risk of morbidity and mortality. Large amount of fluid and gas seen in distal esophageal region consistent with recent hiatal hernia surgery. Gas is also noted in the anterior mediastinum and subcutaneous tissues of  the anterior chest wall also most likely due to recent surgery. Mild bilateral posterior basilar subsegmental atelectasis. Aortic Atherosclerosis (ICD10-I70.0). Electronically Signed: By: Marijo Conception M.D. On: 03/04/2022 14:22   DG ESOPHAGUS W SINGLE CM (SOL OR THIN BA)  Result Date: 03/04/2022 CLINICAL DATA:  Provided history: Status post Nissen fundoplication (without gastrostomy tube) procedure. Additional history obtained from [place] assisted hiatal hernia repair with mesh and Toupet fundoplication on 85/46/2703. EXAM: ESOPHAGUS/BARIUM SWALLOW/TABLET STUDY TECHNIQUE: A problem-oriented water-soluble contrast esophagram was performed to assess for leak or obstruction status post hiatal hernia repair and Toupet fundoplication. The exam was performed by Pasty Spillers, PA-C, and was supervised and interpreted by Dr. Kellie Simmering. FLUOROSCOPY: Fluoroscopy time: 1 minute (9.4 mGy). COMPARISON:  Esophagram 12/22/2021. FINDINGS: A problem-oriented water-soluble contrast esophagram was performed to assess for leak or obstruction status post hiatal hernia repair and Toupet fundoplication. Narrowed appearance of the distal esophagus/GE junction, consistent with the provided history of fundoplication. Mildly delayed passage of contrast from the distal esophagus into the stomach, possibly due to postoperative edema. No extraluminal contrast demonstrated to suggest a leak. Moderate intermittent esophageal dysmotility with tertiary contractions. IMPRESSION: 1. A problem-oriented water-soluble contrast esophagram was performed to assess for leak or obstruction status post hiatal hernia repair and Toupet fundoplication. 2. No extraluminal contrast demonstrated to suggest a leak. 3. Mildly delayed passage of contrast from the distal esophagus into the stomach, possibly due to postoperative edema. 4. Moderate esophageal dysmotility. Electronically Signed   By: Kellie Simmering D.O.   On: 03/04/2022  10:56    Anti-infectives: Anti-infectives (From admission, onward)    Start     Dose/Rate Route Frequency Ordered Stop   03/03/22 0600  ceFAZolin (ANCEF) IVPB 2g/100 mL premix        2 g 200 mL/hr over 30 Minutes Intravenous On call to O.R. 03/03/22 0554 03/03/22 0746       Assessment/Plan: s/p Procedure(s): XI ROBOTIC ASSISTED HIATAL HERNIA REPAIR WITH MESH AND FUNDOPLICATION (N/A) POD 3 -on to eliquis for DVT/PE.  Appreciate Medicine assistance. -Con't to mobilize and wean O2 as tol.   May need home O2.  pulm toilet -con't pureed diet, will add ensure supplements -hopefully home in next 1-2d  Answered all questions to husband and son in room.   LOS: 1 day    Ralene Ok 03/06/2022

## 2022-03-06 NOTE — Progress Notes (Signed)
Patient one assist with walker up to room chair.  Patient able to ambulate around room on room air o2 sat measured at 90%.  02 decreased to 0.5 LPM [place], patient tolerating well at 95%.

## 2022-03-07 DIAGNOSIS — J9601 Acute respiratory failure with hypoxia: Secondary | ICD-10-CM | POA: Diagnosis not present

## 2022-03-07 DIAGNOSIS — I2699 Other pulmonary embolism without acute cor pulmonale: Secondary | ICD-10-CM | POA: Diagnosis not present

## 2022-03-07 DIAGNOSIS — I82401 Acute embolism and thrombosis of unspecified deep veins of right lower extremity: Secondary | ICD-10-CM | POA: Diagnosis not present

## 2022-03-07 DIAGNOSIS — K449 Diaphragmatic hernia without obstruction or gangrene: Secondary | ICD-10-CM | POA: Diagnosis not present

## 2022-03-07 LAB — CBC
HCT: 37.6 % (ref 36.0–46.0)
Hemoglobin: 12.8 g/dL (ref 12.0–15.0)
MCH: 31.4 pg (ref 26.0–34.0)
MCHC: 34 g/dL (ref 30.0–36.0)
MCV: 92.2 fL (ref 80.0–100.0)
Platelets: 183 10*3/uL (ref 150–400)
RBC: 4.08 MIL/uL (ref 3.87–5.11)
RDW: 11.8 % (ref 11.5–15.5)
WBC: 7.4 10*3/uL (ref 4.0–10.5)
nRBC: 0 % (ref 0.0–0.2)

## 2022-03-07 NOTE — Progress Notes (Signed)
Patient ambulated using front wheel walker and one assist out of room into hallway approximately 91f.  Patient 02 sats remained above 94% room air while ambulating.

## 2022-03-07 NOTE — Progress Notes (Signed)
4 Days Post-Op   Subjective/Chief Complaint: Moving better. No dysphagia or nausea. O2 off this AM    Objective: Vital signs in last 24 hours: Temp:  [98.3 F (36.8 C)-100.2 F (37.9 C)] 99.2 F (37.3 C) (02/03 0309) Pulse Rate:  [82-83] 82 (02/03 0309) Resp:  [16-17] 17 (02/03 0309) BP: (142-154)/(76-85) 154/85 (02/03 0309) SpO2:  [92 %-94 %] 94 % (02/03 0309) Last BM Date : 03/02/22  Intake/Output from previous day: 02/02 0701 - 02/03 0700 In: 1594.5 [P.O.:480; I.V.:1114.5] Out: 650 [Urine:650] Intake/Output this shift: No intake/output data recorded.  General appearance: alert and cooperative GI: soft, non-tender; bowel sounds normal; no masses,  no organomegaly and inc c/d/i  Lab Results:  Recent Labs    03/05/22 0526 03/07/22 0235  WBC 6.7 7.4  HGB 12.4 12.8  HCT 36.7 37.6  PLT 173 183    BMET No results for input(s): "NA", "K", "CL", "CO2", "GLUCOSE", "BUN", "CREATININE", "CALCIUM" in the last 72 hours.  PT/INR No results for input(s): "LABPROT", "INR" in the last 72 hours. ABG No results for input(s): "PHART", "HCO3" in the last 72 hours.  Invalid input(s): "PCO2", "PO2"  Studies/Results: VAS [place] LOWER EXTREMITY VENOUS (DVT)  Result Date: 03/06/2022  Lower Venous DVT Study Patient Name:  [name]  Date of Exam:   03/05/2022 Medical Rec #: [id number]         Accession #:    [id number] Date of Birth: 31-Dec-1945         Patient Gender: F Patient Age:   77 years Exam Location:  [facility] Procedure:      VAS [place] LOWER EXTREMITY VENOUS (DVT) Referring Phys: RAJIV PATEL --------------------------------------------------------------------------------  Indications: Pulmonary embolism.  Comparison Study: no prior Performing Technologist: Archie Patten RVS  Examination Guidelines: A complete evaluation includes B-mode imaging, spectral Doppler, color Doppler, and power Doppler as needed of all accessible portions of each vessel. Bilateral testing is  considered an integral part of a complete examination. Limited examinations for reoccurring indications may be performed as noted. The reflux portion of the exam is performed with the patient in reverse Trendelenburg.  +---------+---------------+---------+-----------+----------+-----------------+ RIGHT    CompressibilityPhasicitySpontaneityPropertiesThrombus Aging    +---------+---------------+---------+-----------+----------+-----------------+ CFV      Full           Yes      Yes                                    +---------+---------------+---------+-----------+----------+-----------------+ SFJ      Full                                                           +---------+---------------+---------+-----------+----------+-----------------+ FV Prox  Full                                                           +---------+---------------+---------+-----------+----------+-----------------+ FV Mid   Full                                                           +---------+---------------+---------+-----------+----------+-----------------+  FV DistalFull                                                           +---------+---------------+---------+-----------+----------+-----------------+ PFV      Full                                                           +---------+---------------+---------+-----------+----------+-----------------+ POP      Full           Yes      Yes                                    +---------+---------------+---------+-----------+----------+-----------------+ PTV      Full                                                           +---------+---------------+---------+-----------+----------+-----------------+ PERO     Full                                                           +---------+---------------+---------+-----------+----------+-----------------+ Soleal   None                                         Age  Indeterminate +---------+---------------+---------+-----------+----------+-----------------+   +---------+---------------+---------+-----------+----------+--------------+ LEFT     CompressibilityPhasicitySpontaneityPropertiesThrombus Aging +---------+---------------+---------+-----------+----------+--------------+ CFV      Full           Yes      Yes                                 +---------+---------------+---------+-----------+----------+--------------+ SFJ      Full                                                        +---------+---------------+---------+-----------+----------+--------------+ FV Prox  Full                                                        +---------+---------------+---------+-----------+----------+--------------+ FV Mid   Full                                                        +---------+---------------+---------+-----------+----------+--------------+  FV DistalFull                                                        +---------+---------------+---------+-----------+----------+--------------+ PFV      Full                                                        +---------+---------------+---------+-----------+----------+--------------+ POP      Full           Yes      Yes                                 +---------+---------------+---------+-----------+----------+--------------+ PTV      Full                                                        +---------+---------------+---------+-----------+----------+--------------+ PERO     Full                                                        +---------+---------------+---------+-----------+----------+--------------+     Summary: RIGHT: - Findings consistent with age indeterminate deep vein thrombosis involving the right soleal veins. - No cystic structure found in the popliteal fossa.  LEFT: - There is no evidence of deep vein thrombosis in the lower extremity.  - No  cystic structure found in the popliteal fossa.  *See table(s) above for measurements and observations. Electronically signed by Jamelle Haring on 03/06/2022 at 7:28:10 AM.    Final    ECHOCARDIOGRAM COMPLETE  Result Date: 03/05/2022    ECHOCARDIOGRAM REPORT   Patient Name:   [name] Date of Exam: 03/05/2022 Medical Rec #:  [id number]        Height:       64.0 in Accession #:    [id number]       Weight:       176.0 lb Date of Birth:  07-Nov-1945        BSA:          1.853 m Patient Age:    34 years         BP:           144/82 mmHg Patient Gender: F                HR:           81 bpm. Exam Location:  Inpatient Procedure: 2D Echo, 3D Echo, Cardiac Doppler and Color Doppler Indications:     Pulmonary Embolus I26.09  History:         Patient has no prior history of Echocardiogram examinations.                  Pulmonary HTN, Mitral Valve Disease; Risk Factors:Non-Smoker  and Hypertension.  Sonographer:     Greer Pickerel Referring Phys:  [id number] Randell Loop PATEL Diagnosing Phys: Vernell Leep MD  Sonographer Comments: Image acquisition challenging due to patient body habitus and Image acquisition challenging due to respiratory motion. IMPRESSIONS  1. Left ventricular ejection fraction, by estimation, is 55 to 60%. The left ventricle has normal function. The left ventricle has no regional wall motion abnormalities. Left ventricular diastolic parameters are indeterminate.  2. Right ventricular systolic function is normal. The right ventricular size is upper limit normal. There is moderately elevated pulmonary artery systolic pressure.  3. Left atrial size was moderately dilated.  4. Right atrial size was moderately dilated.  5. The mitral valve is normal in structure. Mild mitral valve regurgitation. No evidence of mitral stenosis.  6. Tricuspid valve regurgitation is moderate.  7. The aortic valve is normal in structure. Aortic valve regurgitation is not visualized. No aortic stenosis is present.  8.  The inferior vena cava is dilated in size with >50% respiratory variability, suggesting right atrial pressure of 8 mmHg. Comparison(s): Compared to outpatient study in 11/2021, estimated PASP increased from 30 mmHg. FINDINGS  Left Ventricle: Left ventricular ejection fraction, by estimation, is 55 to 60%. The left ventricle has normal function. The left ventricle has no regional wall motion abnormalities. The left ventricular internal cavity size was normal in size. There is  no left ventricular hypertrophy. Left ventricular diastolic parameters are indeterminate. Right Ventricle: The right ventricular size is upper limit normal. No increase in right ventricular wall thickness. Right ventricular systolic function is normal. There is moderately elevated pulmonary artery systolic pressure. The tricuspid regurgitant velocity is 3.36 m/s, and with an assumed right atrial pressure of 8 mmHg, the estimated right ventricular systolic pressure is 88.4 mmHg. Left Atrium: Left atrial size was moderately dilated. Right Atrium: Right atrial size was moderately dilated. Pericardium: Echogenic structure in liver parenchyma, possible a cyst. There is no evidence of pericardial effusion. Mitral Valve: The mitral valve is normal in structure. Mild mitral valve regurgitation. No evidence of mitral valve stenosis. Tricuspid Valve: The tricuspid valve is grossly normal. Tricuspid valve regurgitation is moderate. Aortic Valve: The aortic valve is normal in structure. Aortic valve regurgitation is not visualized. No aortic stenosis is present. Pulmonic Valve: The pulmonic valve was grossly normal. Pulmonic valve regurgitation is mild. Aorta: The aortic root is normal in size and structure. Venous: The inferior vena cava is dilated in size with greater than 50% respiratory variability, suggesting right atrial pressure of 8 mmHg. IAS/Shunts: The interatrial septum was not assessed.  LEFT VENTRICLE PLAX 2D LVIDd:         4.10 cm    Diastology LVIDs:         2.80 cm   LV e' medial:    7.29 cm/s LV PW:         0.90 cm   LV E/e' medial:  11.7 LV IVS:        0.90 cm   LV e' lateral:   11.10 cm/s LVOT diam:     1.80 cm   LV E/e' lateral: 7.7 LV SV:         62 LV SV Index:   34 LVOT Area:     2.54 cm  RIGHT VENTRICLE RV S prime:     25.60 cm/s TAPSE (M-mode): 1.8 cm LEFT ATRIUM              Index        RIGHT ATRIUM  Index LA diam:        3.50 cm  1.89 cm/m   RA Area:     26.40 cm LA Vol (A2C):   70.0 ml  37.78 ml/m  RA Volume:   86.00 ml  46.41 ml/m LA Vol (A4C):   132.0 ml 71.24 ml/m LA Biplane Vol: 101.0 ml 54.51 ml/m  AORTIC VALVE             PULMONIC VALVE LVOT Vmax:   122.00 cm/s PR End Diast Vel: 6.97 msec LVOT Vmean:  79.800 cm/s LVOT VTI:    0.244 m  AORTA Ao Root diam: 3.20 cm Ao Asc diam:  3.40 cm MITRAL VALVE               TRICUSPID VALVE MV Area (PHT): 3.33 cm    TR Peak grad:   45.2 mmHg MV Decel Time: 228 msec    TR Vmax:        336.00 cm/s MV E velocity: 85.40 cm/s MV A velocity: 78.60 cm/s  SHUNTS MV E/A ratio:  1.09        Systemic VTI:  0.24 m                            Systemic Diam: 1.80 cm Vernell Leep MD Electronically signed by Vernell Leep MD Signature Date/Time: 03/05/2022/5:01:17 PM    Final     Anti-infectives: Anti-infectives (From admission, onward)    Start     Dose/Rate Route Frequency Ordered Stop   03/03/22 0600  ceFAZolin (ANCEF) IVPB 2g/100 mL premix        2 g 200 mL/hr over 30 Minutes Intravenous On call to O.R. 03/03/22 0554 03/03/22 0746       Assessment/Plan: s/p Procedure(s): XI ROBOTIC ASSISTED HIATAL HERNIA REPAIR WITH MESH AND FUNDOPLICATION (N/A) POD 4 -on to eliquis for DVT/PE.  Appreciate Medicine assistance. -Con't to mobilize and wean O2 as tol. Off oxygen this AM -con't pureed diet, will add ensure supplements -Can likely discharge today or tomorrow if cleared by medical team   LOS: 2 days    Clovis Riley 03/07/2022

## 2022-03-07 NOTE — Progress Notes (Signed)
Hospitalist Consult Progress Note   ZARINAH OVIATT  CHY:[id number]  DOB: Mar 31, 1945  DOA: 03/03/2022  PCP: Kristen Loader, FNP  Requesting provider: Dr. Rosendo Gros Reason for consultation: PE, hypoxia    History of Present Illness: [name] is a 77 y.o. female with past medical history of GERD, AAA, moderate MR/TR, pHTN, HLD admitted for hiatal hernia repair.  After surgery she developed diaphoresis and dizziness with associated hypoxia. She endorsed having some exertional dyspnea at home prior to surgery as well and has been less ambulatory but not dramatically bedbound. She underwent CTA chest after developing her symptoms and was found to have bilateral pulmonary embolisms.  She was started on a heparin drip and oxygen.  Interval History: No events overnight.  Husband present bedside this morning. Ambulating better today and she is hoping for 1 more night in the hospital to continue working on further ambulation and ensuring oxygen does not drop.  She did better on walking saturation test today with no hypoxia.  Assessment and Plan:  Acute PE -CTA chest shows PE involving left pulmonary artery extending into upper lobe branches along with upper lobe branch of the right pulmonary artery -Echo negative for right heart strain or failure but does show right ventricular size upper limit of normal due to PE causing elevated PA pressures as expected; EF normal, 55 to 60%, indeterminate diastolic parameters, no RWMA; dilated LA and RA noted.  No mitral stenosis - no bleeding; continue eliquis -Would be considered stable for discharge from IM standpoint on Sunday if no further hypoxia with ambulating  Right LE DVT - duplex notes age-indeterminate DVT involving right soleal vein - continue eliquis  Acute hypoxic respiratory distress - etiology suspected due to PE; patient developed hypoxia, diaphoresis, dizziness with onset of symptoms -Passed walk test today,  ambulated approximately 40 feet with saturations greater than 94% -Repeat Sunday and hopeful for no further hypoxia    Past Medical History: Past Medical History:  Diagnosis Date   Anemia    Ascending aorta dilatation (HCC)    Atrial dilatation    Bartholin cyst    Compression fracture    lumbar    Constipation    Disorder of bone density and structure, unspecified    Fatty liver    Hearing loss    History of colon polyps    History of hiatal hernia    History of SCC (squamous cell carcinoma) of skin    Hypertension    Liver lesion    Mild cardiomegaly    Mitral regurgitation    Multinodular goiter    Obesity    Other iron deficiency anemia    Primary osteoarthritis    Pulmonary hypertension ([place])    Pure hypercholesterolemia    Scoliosis    Sensory - neural hearing loss    right   Tremor    Tricuspid regurgitation    Vitamin D deficiency     Past Surgical History: Past Surgical History:  Procedure Laterality Date   APPENDECTOMY     BONE ANCHORED HEARING AID IMPLANT Right 10/10/2014   Procedure: BONE ANCHORED HEARING AID (BAHA) IMPLANT TEMPORAL BONE RIGHT EAR;  Surgeon: Vicie Mutters, MD;  Location: [place];  Service: ENT;  Laterality: Right;   BREAST REDUCTION SURGERY     CARPAL TUNNEL RELEASE Right    CATARACT EXTRACTION Bilateral    ROTATOR CUFF REPAIR Right  STAPEDECTOMY Bilateral    has metal implants (1 in each ear) - pt cannot have a MRI of her head/brain   UMBILICAL HERNIA REPAIR     XI ROBOTIC ASSISTED HIATAL HERNIA REPAIR N/A 03/03/2022   Procedure: XI ROBOTIC ASSISTED HIATAL HERNIA REPAIR WITH MESH AND FUNDOPLICATION;  Surgeon: Ralene Ok, MD;  Location: [place];  Service: General;  Laterality: N/A;    Allergies:  No Known Allergies  Social History:  reports that she has never smoked. She has never used smokeless tobacco. She reports current alcohol use. She reports that she does not use drugs.  Family History: Family  History  Problem Relation Age of Onset   Cancer Sister     Objective: Physical Exam: Vitals:   03/06/22 1718 03/06/22 2002 03/07/22 0309 03/07/22 0834  BP: (!) 150/76 (!) 142/85 (!) 154/85 (!) 146/72  Pulse: 83 82 82 81  Resp: '17 16 17 16  '$ Temp: 100.2 F (37.9 C) 100.2 F (37.9 C) 99.2 F (37.3 C) 98.7 F (37.1 C)  TempSrc: Oral Oral Oral Oral  SpO2: 92% 94% 94% 91%  Weight:      Height:       Physical Exam Constitutional:      General: She is not in acute distress.    Appearance: Normal appearance.  HENT:     Head: Normocephalic and atraumatic.     Mouth/Throat:     Mouth: Mucous membranes are moist.  Eyes:     Extraocular Movements: Extraocular movements intact.  Cardiovascular:     Rate and Rhythm: Normal rate and regular rhythm.  Pulmonary:     Effort: Pulmonary effort is normal.     Breath sounds: Normal breath sounds. No wheezing or rhonchi.  Abdominal:     General: Bowel sounds are normal. There is no distension.     Palpations: Abdomen is soft.     Tenderness: There is no abdominal tenderness.  Musculoskeletal:        General: Normal range of motion.     Cervical back: Normal range of motion and neck supple.  Skin:    General: Skin is warm and dry.  Neurological:     General: No focal deficit present.     Mental Status: She is alert.  Psychiatric:        Mood and Affect: Mood normal.     Data reviewed:  I have personally reviewed following labs and imaging studies Results for orders placed or performed during the hospital encounter of 03/03/22 (from the past 48 hour(s))  CBC     Status: None   Collection Time: 03/07/22  2:35 AM  Result Value Ref Range   WBC 7.4 4.0 - 10.5 K/uL   RBC 4.08 3.87 - 5.11 MIL/uL   Hemoglobin 12.8 12.0 - 15.0 g/dL   HCT 37.6 36.0 - 46.0 %   MCV 92.2 80.0 - 100.0 fL   MCH 31.4 26.0 - 34.0 pg   MCHC 34.0 30.0 - 36.0 g/dL   RDW 11.8 11.5 - 15.5 %   Platelets 183 150 - 400 K/uL   nRBC 0.0 0.0 - 0.2 %    Comment:  Performed at [facility], [place] [address], [place], [place] [address]    Labs:  CBC: Recent Labs  Lab 03/04/22 0216 03/04/22 2247 03/05/22 0526 03/07/22 0235  WBC 10.1 7.4 6.7 7.4  HGB 13.1 12.2 12.4 12.8  HCT 38.6 36.1 36.7 37.6  MCV 93.7 93.5 95.1 92.2  PLT 194 174  173 183     Basic Metabolic Panel: Recent Labs  Lab 03/04/22 0216  NA 136  K 4.6  CL 103  CO2 24  GLUCOSE 143*  BUN 10  CREATININE 0.76  CALCIUM 8.6*    GFR Estimated Creatinine Clearance: 58.2 mL/min (by C-G formula based on SCr of 0.76 mg/dL). Liver Function Tests: No results for input(s): "AST", "ALT", "ALKPHOS", "BILITOT", "PROT", "ALBUMIN" in the last 168 hours. No results for input(s): "LIPASE", "AMYLASE" in the last 168 hours. No results for input(s): "AMMONIA" in the last 168 hours. Coagulation profile No results for input(s): "INR", "PROTIME" in the last 168 hours.  Cardiac Enzymes: No results for input(s): "CKTOTAL", "CKMB", "CKMBINDEX", "TROPONINI" in the last 168 hours. BNP: Invalid input(s): "POCBNP" CBG: No results for input(s): "GLUCAP" in the last 168 hours. D-Dimer No results for input(s): "DDIMER" in the last 72 hours. Hgb A1c No results for input(s): "HGBA1C" in the last 72 hours. Lipid Profile No results for input(s): "CHOL", "HDL", "LDLCALC", "TRIG", "CHOLHDL", "LDLDIRECT" in the last 72 hours. Thyroid function studies No results for input(s): "TSH", "T4TOTAL", "T3FREE", "THYROIDAB" in the last 72 hours.  Invalid input(s): "FREET3" Anemia work up No results for input(s): "VITAMINB12", "FOLATE", "FERRITIN", "TIBC", "IRON", "RETICCTPCT" in the last 72 hours. Urinalysis No results found for: "COLORURINE", "APPEARANCEUR", "LABSPEC", "PHURINE", "GLUCOSEU", "HGBUR", "BILIRUBINUR", "KETONESUR", "PROTEINUR", "UROBILINOGEN", "NITRITE", "LEUKOCYTESUR"  Microbiology No results found for this or any previous visit (from the past 240 hour(s)).  Inpatient Medications:    Scheduled Meds:  apixaban  10 mg Oral BID   Followed by   Derrill Memo ON 03/12/2022] apixaban  5 mg Oral BID   feeding supplement  1 Container Oral TID BM   Ensure Max Protein  11 oz Oral Daily   PRN Meds: HYDROcodone-acetaminophen, HYDROmorphone (DILAUDID) injection, iohexol, ondansetron **OR** ondansetron (ZOFRAN) IV, polyvinyl alcohol Continuous Infusions:    Radiological Exams on Admission: No results found.  Thank you for this consultation. [facility] hospitalist team will follow the patient with you.   Time spent: Greater than 50% of the 35 minute visit was spent in counseling/coordination of care for the patient as laid out in the A&P.   Dwyane Dee M.D. [facility] 03/07/2022, 3:53 PM Pager: Secure chat

## 2022-03-08 DIAGNOSIS — K449 Diaphragmatic hernia without obstruction or gangrene: Secondary | ICD-10-CM | POA: Diagnosis not present

## 2022-03-08 DIAGNOSIS — J9601 Acute respiratory failure with hypoxia: Secondary | ICD-10-CM | POA: Diagnosis not present

## 2022-03-08 DIAGNOSIS — I2699 Other pulmonary embolism without acute cor pulmonale: Secondary | ICD-10-CM | POA: Diagnosis not present

## 2022-03-08 LAB — CBC
HCT: 35.6 % — ABNORMAL LOW (ref 36.0–46.0)
Hemoglobin: 12.1 g/dL (ref 12.0–15.0)
MCH: 31.6 pg (ref 26.0–34.0)
MCHC: 34 g/dL (ref 30.0–36.0)
MCV: 93 fL (ref 80.0–100.0)
Platelets: 214 10*3/uL (ref 150–400)
RBC: 3.83 MIL/uL — ABNORMAL LOW (ref 3.87–5.11)
RDW: 11.9 % (ref 11.5–15.5)
WBC: 8.5 10*3/uL (ref 4.0–10.5)
nRBC: 0 % (ref 0.0–0.2)

## 2022-03-08 MED ORDER — HYDROCODONE-ACETAMINOPHEN 7.5-325 MG/15ML PO SOLN
10.0000 mL | Freq: Four times a day (QID) | ORAL | 0 refills | Status: AC | PRN
  Filled 2022-03-08: qty 120, 5d supply, fill #0

## 2022-03-08 MED ORDER — BISACODYL 10 MG RE SUPP: 10.0000 mg | Freq: Every day | RECTAL | Status: DC | PRN

## 2022-03-08 MED ORDER — DOCUSATE SODIUM 100 MG PO CAPS
100.0000 mg | ORAL_CAPSULE | Freq: Two times a day (BID) | ORAL | Status: DC
  Administered 2022-03-08 – 2022-03-09 (×3): 100 mg via ORAL
  Filled 2022-03-08 (×3): qty 1

## 2022-03-08 MED ORDER — HYDROCODONE-ACETAMINOPHEN 7.5-325 MG/15ML PO SOLN
10.0000 mL | Freq: Four times a day (QID) | ORAL | Status: DC | PRN
  Administered 2022-03-08 – 2022-03-09 (×2): 10 mL via ORAL
  Filled 2022-03-08 (×2): qty 15

## 2022-03-08 NOTE — Progress Notes (Signed)
Mobility Specialist: Progress Note   03/08/22 1527  Mobility  Activity Ambulated with assistance in hallway  Level of Assistance Contact guard assist, steadying assist  Assistive Device Front wheel walker  Distance Ambulated (ft) 550 ft  Activity Response Tolerated well  Mobility Referral Yes  $Mobility charge 1 Mobility   Post-Mobility: 85 HR, 94% SpO2  Pt received in the bed and agreeable to mobility. Standby assist with bed mobility and contact guard during ambulation. Stopped x2 for brief standing breaks secondary to fatigue, otherwise asymptomatic. Pt back to bed after session with call bell at her side.   Elias-Fela Solis Brodi Nery Mobility Specialist Please contact via SecureChat or Rehab office at [phone]

## 2022-03-08 NOTE — Progress Notes (Signed)
Patient has ambulated in hallway x3 this shift, twice with this nurse and once with mobility specialist. All ambulation sessions patient has remained on room air, no c/o SOB. Last ambulation session, patient paused x1 due to fatigue, but quickly resumed pace. Patient in bed at this time, husband at bedside, and call bell in reach.

## 2022-03-08 NOTE — Progress Notes (Signed)
Patient ambulated with walker and this nurse approximately 255f on room air, maintaining O2 sats 98-100%. Able to maintain conversation, no SOB. Began in chair, ambulated to bathroom, then in the hallway then ambulated back to bed. Family at bedside, call bell within reach. Continuous pulse ox monitor reapplied.

## 2022-03-08 NOTE — Progress Notes (Signed)
Per previous shift, patient had to be placed back on 0.5-1L O2 nasal cannula. Upon this nurse's assessment this morning, cannula out of nose and patient on room air, O2 sat 97%. Patient aware. Will assess O2 need when patient gets OOB. Husband remains at bedside, made aware to notify staff if SOB occurs, verbalized understanding.

## 2022-03-08 NOTE — Progress Notes (Signed)
Hospitalist Consult Progress Note   [name]  AVW:[id number]  DOB: September 26, 1945  DOA: 03/03/2022  PCP: Kristen Loader, FNP  Requesting provider: Dr. Rosendo Gros Reason for consultation: PE, hypoxia    History of Present Illness: [name] is a 77 y.o. female with past medical history of GERD, AAA, moderate MR/TR, pHTN, HLD admitted for hiatal hernia repair.  After surgery she developed diaphoresis and dizziness with associated hypoxia. She endorsed having some exertional dyspnea at home prior to surgery as well and has been less ambulatory but not dramatically bedbound. She underwent CTA chest after developing her symptoms and was found to have bilateral pulmonary embolisms.  She was started on a heparin drip and oxygen.  Interval History: Reported to have had a near syncopal event last night when ambulating.  She felt dizzy and diaphoretic as she did initially after hernia surgery.  She was placed on 1 L oxygen. This morning she is feeling much better and was able to ambulate on room air with nursing with no further O2 desaturations.  Assessment and Plan:  Acute PE -CTA chest shows PE involving left pulmonary artery extending into upper lobe branches along with upper lobe branch of the right pulmonary artery -Echo negative for right heart strain or failure but does show right ventricular size upper limit of normal due to PE causing elevated PA pressures as expected; EF normal, 55 to 60%, indeterminate diastolic parameters, no RWMA; dilated LA and RA noted.  No mitral stenosis - no bleeding; continue eliquis - hard to say what happened last night (pain induced? Lower suspicion for further DVT dislodgement given quick resolution of symptoms and is again ambulating this morning on RA with sats now even better than yesterday, 98-100%) - reasonable to watch 1 more night and confirm hypoxia remains resolved   Right LE DVT - duplex notes age-indeterminate DVT  involving right soleal vein - continue eliquis  Acute hypoxic respiratory distress - etiology suspected due to PE; patient developed hypoxia, diaphoresis, dizziness with onset of symptoms -Passed walk test Saturday, ambulated approximately 40 feet with saturations greater than 94% but had some sort of near-syncope later that evening - ambulated again today, Sunday, 274f with sats on RA 98-100% - continue watching O2 again today/tonight   Past Medical History: Past Medical History:  Diagnosis Date   Anemia    Ascending aorta dilatation (HCC)    Atrial dilatation    Bartholin cyst    Compression fracture    lumbar    Constipation    Disorder of bone density and structure, unspecified    Fatty liver    Hearing loss    History of colon polyps    History of hiatal hernia    History of SCC (squamous cell carcinoma) of skin    Hypertension    Liver lesion    Mild cardiomegaly    Mitral regurgitation    Multinodular goiter    Obesity    Other iron deficiency anemia    Primary osteoarthritis    [place]    Pure hypercholesterolemia    Scoliosis    Sensory - neural hearing loss    right   Tremor    Tricuspid regurgitation    Vitamin D deficiency     Past Surgical History: Past Surgical History:  Procedure Laterality Date   APPENDECTOMY     BONE ANCHORED HEARING AID IMPLANT Right 10/10/2014  Procedure: BONE ANCHORED HEARING AID (BAHA) IMPLANT TEMPORAL BONE RIGHT EAR;  Surgeon: Vicie Mutters, MD;  Location: [facility];  Service: ENT;  Laterality: Right;   BREAST REDUCTION SURGERY     CARPAL TUNNEL RELEASE Right    CATARACT EXTRACTION Bilateral    ROTATOR CUFF REPAIR Right    STAPEDECTOMY Bilateral    has metal implants (1 in each ear) - pt cannot have a MRI of her head/brain   UMBILICAL HERNIA REPAIR     XI ROBOTIC ASSISTED HIATAL HERNIA REPAIR N/A 03/03/2022   Procedure: XI ROBOTIC ASSISTED HIATAL HERNIA REPAIR WITH MESH AND  FUNDOPLICATION;  Surgeon: Ralene Ok, MD;  Location: [facility];  Service: General;  Laterality: N/A;    Allergies:  No Known Allergies  Social History:  reports that she has never smoked. She has never used smokeless tobacco. She reports current alcohol use. She reports that she does not use drugs.  Family History: Family History  Problem Relation Age of Onset   Cancer Sister     Objective: Physical Exam: Vitals:   03/07/22 0834 03/07/22 1602 03/07/22 2236 03/08/22 0541  BP: (!) 146/72 125/62 (!) 150/70 122/72  Pulse: 81 73 95 75  Resp: '16 16 18 16  '$ Temp: 98.7 F (37.1 C) 97.8 F (36.6 C) 97.7 F (36.5 C) 97.9 F (36.6 C)  TempSrc: Oral Oral Oral Oral  SpO2: 91% 97% 94% 97%  Weight:      Height:       Physical Exam Constitutional:      General: She is not in acute distress.    Appearance: Normal appearance.  HENT:     Head: Normocephalic and atraumatic.     Mouth/Throat:     Mouth: Mucous membranes are moist.  Eyes:     Extraocular Movements: Extraocular movements intact.  Cardiovascular:     Rate and Rhythm: Normal rate and regular rhythm.  Pulmonary:     Effort: Pulmonary effort is normal.     Breath sounds: Normal breath sounds. No wheezing or rhonchi.  Abdominal:     General: Bowel sounds are normal. There is no distension.     Palpations: Abdomen is soft.     Tenderness: There is no abdominal tenderness.  Musculoskeletal:        General: Normal range of motion.     Cervical back: Normal range of motion and neck supple.  Skin:    General: Skin is warm and dry.  Neurological:     General: No focal deficit present.     Mental Status: She is alert.  Psychiatric:        Mood and Affect: Mood normal.     Data reviewed:  I have personally reviewed following labs and imaging studies Results for orders placed or performed during the hospital encounter of 03/03/22 (from the past 48 hour(s))  CBC     Status: None   Collection Time: 03/07/22  2:35 AM   Result Value Ref Range   WBC 7.4 4.0 - 10.5 K/uL   RBC 4.08 3.87 - 5.11 MIL/uL   Hemoglobin 12.8 12.0 - 15.0 g/dL   HCT 37.6 36.0 - 46.0 %   MCV 92.2 80.0 - 100.0 fL   MCH 31.4 26.0 - 34.0 pg   MCHC 34.0 30.0 - 36.0 g/dL   RDW 11.8 11.5 - 15.5 %   Platelets 183 150 - 400 K/uL   nRBC 0.0 0.0 - 0.2 %    Comment: Performed at [facility]  [facility], [place] [address] [place], [place] [address]  CBC     Status: Abnormal   Collection Time: 03/08/22  2:47 AM  Result Value Ref Range   WBC 8.5 4.0 - 10.5 K/uL   RBC 3.83 (L) 3.87 - 5.11 MIL/uL   Hemoglobin 12.1 12.0 - 15.0 g/dL   HCT 35.6 (L) 36.0 - 46.0 %   MCV 93.0 80.0 - 100.0 fL   MCH 31.6 26.0 - 34.0 pg   MCHC 34.0 30.0 - 36.0 g/dL   RDW 11.9 11.5 - 15.5 %   Platelets 214 150 - 400 K/uL   nRBC 0.0 0.0 - 0.2 %    Comment: Performed at [facility], [place] [address] [place], [place] [address]    Labs:  CBC: Recent Labs  Lab 03/04/22 0216 03/04/22 2247 03/05/22 0526 03/07/22 0235 03/08/22 0247  WBC 10.1 7.4 6.7 7.4 8.5  HGB 13.1 12.2 12.4 12.8 12.1  HCT 38.6 36.1 36.7 37.6 35.6*  MCV 93.7 93.5 95.1 92.2 93.0  PLT 194 174 173 183 214     Basic Metabolic Panel: Recent Labs  Lab 03/04/22 0216  NA 136  K 4.6  CL 103  CO2 24  GLUCOSE 143*  BUN 10  CREATININE 0.76  CALCIUM 8.6*    GFR Estimated Creatinine Clearance: 58.2 mL/min (by C-G formula based on SCr of 0.76 mg/dL). Liver Function Tests: No results for input(s): "AST", "ALT", "ALKPHOS", "BILITOT", "PROT", "ALBUMIN" in the last 168 hours. No results for input(s): "LIPASE", "AMYLASE" in the last 168 hours. No results for input(s): "AMMONIA" in the last 168 hours. Coagulation profile No results for input(s): "INR", "PROTIME" in the last 168 hours.  Cardiac Enzymes: No results for input(s): "CKTOTAL", "CKMB", "CKMBINDEX", "TROPONINI" in the last 168 hours. BNP: Invalid input(s): "POCBNP" CBG: No results for input(s): "GLUCAP" in the last 168  hours. D-Dimer No results for input(s): "DDIMER" in the last 72 hours. Hgb A1c No results for input(s): "HGBA1C" in the last 72 hours. Lipid Profile No results for input(s): "CHOL", "HDL", "LDLCALC", "TRIG", "CHOLHDL", "LDLDIRECT" in the last 72 hours. Thyroid function studies No results for input(s): "TSH", "T4TOTAL", "T3FREE", "THYROIDAB" in the last 72 hours.  Invalid input(s): "FREET3" Anemia work up No results for input(s): "VITAMINB12", "FOLATE", "FERRITIN", "TIBC", "IRON", "RETICCTPCT" in the last 72 hours. Urinalysis No results found for: "COLORURINE", "APPEARANCEUR", "LABSPEC", "PHURINE", "GLUCOSEU", "HGBUR", "BILIRUBINUR", "KETONESUR", "PROTEINUR", "UROBILINOGEN", "NITRITE", "LEUKOCYTESUR"  Microbiology No results found for this or any previous visit (from the past 240 hour(s)).  Inpatient Medications:   Scheduled Meds:  apixaban  10 mg Oral BID   Followed by   Derrill Memo ON 03/12/2022] apixaban  5 mg Oral BID   docusate sodium  100 mg Oral BID   feeding supplement  1 Container Oral TID BM   Ensure Max Protein  11 oz Oral Daily   PRN Meds: bisacodyl, HYDROcodone-acetaminophen, HYDROmorphone (DILAUDID) injection, iohexol, ondansetron **OR** ondansetron (ZOFRAN) IV, polyvinyl alcohol Continuous Infusions:    Radiological Exams on Admission: No results found.  Thank you for this consultation. [facility] hospitalist team will follow the patient with you.   Time spent: Greater than 50% of the 35 minute visit was spent in counseling/coordination of care for the patient as laid out in the A&P.   Dwyane Dee M.D. [facility] 03/08/2022, 2:00 PM Pager: Secure chat

## 2022-03-08 NOTE — Progress Notes (Signed)
5 Days Post-Op   Subjective/Chief Complaint: Had a good day and was off O2 all day yesterday, but had a near syncopal experience when she got up to use the restroom around 10pm. Has been on O2 since then. On 1l this am   Objective: Vital signs in last 24 hours: Temp:  [97.7 F (36.5 C)-98.7 F (37.1 C)] 97.9 F (36.6 C) (02/04 0541) Pulse Rate:  [73-95] 75 (02/04 0541) Resp:  [16-18] 16 (02/04 0541) BP: (122-150)/(62-72) 122/72 (02/04 0541) SpO2:  [91 %-97 %] 97 % (02/04 0541) Last BM Date : 03/02/22  Intake/Output from previous day: No intake/output data recorded. Intake/Output this shift: No intake/output data recorded.  General appearance: alert and cooperative. Sats 93% on 1L. Up to 99 when doing IS, pulls up to 1250 GI: soft, non-tender; bowel sounds normal; no masses,  no organomegaly and inc c/d/i  Lab Results:  Recent Labs    03/07/22 0235 03/08/22 0247  WBC 7.4 8.5  HGB 12.8 12.1  HCT 37.6 35.6*  PLT 183 214    BMET No results for input(s): "NA", "K", "CL", "CO2", "GLUCOSE", "BUN", "CREATININE", "CALCIUM" in the last 72 hours.  PT/INR No results for input(s): "LABPROT", "INR" in the last 72 hours. ABG No results for input(s): "PHART", "HCO3" in the last 72 hours.  Invalid input(s): "PCO2", "PO2"  Studies/Results: No results found.  Anti-infectives: Anti-infectives (From admission, onward)    Start     Dose/Rate Route Frequency Ordered Stop   03/03/22 0600  ceFAZolin (ANCEF) IVPB 2g/100 mL premix        2 g 200 mL/hr over 30 Minutes Intravenous On call to O.R. 03/03/22 0554 03/03/22 0746       Assessment/Plan: s/p Procedure(s): XI ROBOTIC ASSISTED HIATAL HERNIA REPAIR WITH MESH AND FUNDOPLICATION (N/A) POD 5 -on to eliquis for DVT/PE.  Appreciate Medicine assistance. -Encouraged aggressive mobilization and IS -con't pureed diet, will add ensure supplements. Added stool softeners as no BM since surgery.  -resent her post op pain rx to the  [facility] per her request -Wean off o2. Possible DC tomorrow if no further issues.    LOS: 3 days    Clovis Riley 03/08/2022

## 2022-03-09 ENCOUNTER — Other Ambulatory Visit (HOSPITAL_COMMUNITY): Payer: Self-pay

## 2022-03-09 DIAGNOSIS — I2699 Other pulmonary embolism without acute cor pulmonale: Secondary | ICD-10-CM | POA: Diagnosis not present

## 2022-03-09 DIAGNOSIS — J9601 Acute respiratory failure with hypoxia: Secondary | ICD-10-CM | POA: Diagnosis not present

## 2022-03-09 DIAGNOSIS — K449 Diaphragmatic hernia without obstruction or gangrene: Secondary | ICD-10-CM | POA: Diagnosis not present

## 2022-03-09 LAB — CBC
HCT: 36 % (ref 36.0–46.0)
Hemoglobin: 12.6 g/dL (ref 12.0–15.0)
MCH: 31.8 pg (ref 26.0–34.0)
MCHC: 35 g/dL (ref 30.0–36.0)
MCV: 90.9 fL (ref 80.0–100.0)
Platelets: 245 10*3/uL (ref 150–400)
RBC: 3.96 MIL/uL (ref 3.87–5.11)
RDW: 11.8 % (ref 11.5–15.5)
WBC: 5.6 10*3/uL (ref 4.0–10.5)
nRBC: 0 % (ref 0.0–0.2)

## 2022-03-09 NOTE — Progress Notes (Signed)
Patient walked on RA with husband about 300 feet.  No shortness of breath reported to me.

## 2022-03-09 NOTE — Care Management Important Message (Signed)
Important Message  Patient Details  Name: [name] MRN: [id number] Date of Birth: 21-Apr-1945   Medicare Important Message Given:  Yes     Hannah Beat 03/09/2022, 2:45 PM

## 2022-03-09 NOTE — Progress Notes (Signed)
Hospitalist Consult Progress Note   BRINLYNN GORTON  VVO:[id number]  DOB: 1945/05/26  DOA: 03/03/2022  PCP: Kristen Loader, FNP  Requesting provider: Dr. Rosendo Gros Reason for consultation: PE, hypoxia    History of Present Illness: [name] is a 77 y.o. female with past medical history of GERD, AAA, moderate MR/TR, pHTN, HLD admitted for hiatal hernia repair.  After surgery she developed diaphoresis and dizziness with associated hypoxia. She endorsed having some exertional dyspnea at home prior to surgery as well and has been less ambulatory but not dramatically bedbound. She underwent CTA chest after developing her symptoms and was found to have bilateral pulmonary embolisms.  She was started on a heparin drip and oxygen.  Interval History: No events overnight.  Did extremely well with further ambulation yesterday and no further hypoxia with exertion. She feels comfortable with discharging home today.  Assessment and Plan:  Acute PE -CTA chest shows PE involving left pulmonary artery extending into upper lobe branches along with upper lobe branch of the right pulmonary artery -Echo negative for right heart strain or failure but does show right ventricular size upper limit of normal due to PE causing elevated PA pressures as expected; EF normal, 55 to 60%, indeterminate diastolic parameters, no RWMA; dilated LA and RA noted.  No mitral stenosis - no bleeding; continue eliquis - hard to say what happened last night (pain induced? Lower suspicion for further DVT dislodgement given quick resolution of symptoms and is again ambulating this morning on RA with sats now even better than yesterday, 98-100%) -Continued to ambulate well yesterday with no further hypoxia.  Stable for discharge from IM standpoint  Right LE DVT - duplex notes age-indeterminate DVT involving right soleal vein - continue eliquis  Acute hypoxic respiratory distress - resolved  - etiology  suspected due to PE; patient developed hypoxia, diaphoresis, dizziness with onset of symptoms -Passed walk test Saturday, ambulated approximately 40 feet with saturations greater than 94% but had some sort of near-syncope later that evening - ambulated again today, Sunday, 293f with sats on RA 98-100% - no further hypoxia with exertion prior to discharge    Past Medical History: Past Medical History:  Diagnosis Date   Anemia    Ascending aorta dilatation (HCC)    Atrial dilatation    Bartholin cyst    Compression fracture    lumbar    Constipation    Disorder of bone density and structure, unspecified    Fatty liver    Hearing loss    History of colon polyps    History of hiatal hernia    History of SCC (squamous cell carcinoma) of skin    Hypertension    Liver lesion    Mild cardiomegaly    Mitral regurgitation    Multinodular goiter    Obesity    Other iron deficiency anemia    Primary osteoarthritis    [place]    Pure hypercholesterolemia    Scoliosis    Sensory - neural hearing loss    right   Tremor    Tricuspid regurgitation    Vitamin D deficiency     Past Surgical History: Past Surgical History:  Procedure Laterality Date   APPENDECTOMY     BONE ANCHORED HEARING AID IMPLANT Right 10/10/2014   Procedure: BONE ANCHORED HEARING AID (BAHA) IMPLANT TEMPORAL BONE RIGHT EAR;  Surgeon: EVicie Mutters MD;  Location: [facility];  Service: ENT;  Laterality: Right;   BREAST REDUCTION SURGERY     CARPAL TUNNEL RELEASE Right    CATARACT EXTRACTION Bilateral    ROTATOR CUFF REPAIR Right    STAPEDECTOMY Bilateral    has metal implants (1 in each ear) - pt cannot have a MRI of her head/brain   UMBILICAL HERNIA REPAIR     XI ROBOTIC ASSISTED HIATAL HERNIA REPAIR N/A 03/03/2022   Procedure: XI ROBOTIC ASSISTED HIATAL HERNIA REPAIR WITH MESH AND FUNDOPLICATION;  Surgeon: Ralene Ok, MD;  Location: [facility];  Service: General;  Laterality:  N/A;    Allergies:  No Known Allergies  Social History:  reports that she has never smoked. She has never used smokeless tobacco. She reports current alcohol use. She reports that she does not use drugs.  Family History: Family History  Problem Relation Age of Onset   Cancer Sister     Objective: Physical Exam: Vitals:   03/08/22 1401 03/08/22 2110 03/09/22 0615 03/09/22 0718  BP: 127/70 (!) 142/73 (!) 142/71 128/76  Pulse: 80 80 75 77  Resp: '16 18 18 16  '$ Temp: 97.6 F (36.4 C) (!) 97.4 F (36.3 C) 98.2 F (36.8 C) 98.2 F (36.8 C)  TempSrc: Oral Oral Oral Oral  SpO2: 97% 95% 94% 98%  Weight:      Height:       Physical Exam Constitutional:      General: She is not in acute distress.    Appearance: Normal appearance.  HENT:     Head: Normocephalic and atraumatic.     Mouth/Throat:     Mouth: Mucous membranes are moist.  Eyes:     Extraocular Movements: Extraocular movements intact.  Cardiovascular:     Rate and Rhythm: Normal rate and regular rhythm.  Pulmonary:     Effort: Pulmonary effort is normal.     Breath sounds: Normal breath sounds. No wheezing or rhonchi.  Abdominal:     General: Bowel sounds are normal. There is no distension.     Palpations: Abdomen is soft.     Tenderness: There is no abdominal tenderness.  Musculoskeletal:        General: Normal range of motion.     Cervical back: Normal range of motion and neck supple.  Skin:    General: Skin is warm and dry.  Neurological:     General: No focal deficit present.     Mental Status: She is alert.  Psychiatric:        Mood and Affect: Mood normal.     Data reviewed:  I have personally reviewed following labs and imaging studies Results for orders placed or performed during the hospital encounter of 03/03/22 (from the past 48 hour(s))  CBC     Status: Abnormal   Collection Time: 03/08/22  2:47 AM  Result Value Ref Range   WBC 8.5 4.0 - 10.5 K/uL   RBC 3.83 (L) 3.87 - 5.11 MIL/uL    Hemoglobin 12.1 12.0 - 15.0 g/dL   HCT 35.6 (L) 36.0 - 46.0 %   MCV 93.0 80.0 - 100.0 fL   MCH 31.6 26.0 - 34.0 pg   MCHC 34.0 30.0 - 36.0 g/dL   RDW 11.9 11.5 - 15.5 %   Platelets 214 150 - 400 K/uL   nRBC 0.0 0.0 - 0.2 %    Comment: Performed at [facility], [place] [address], [place], [place] [address]  CBC     Status:  None   Collection Time: 03/09/22  1:58 AM  Result Value Ref Range   WBC 5.6 4.0 - 10.5 K/uL   RBC 3.96 3.87 - 5.11 MIL/uL   Hemoglobin 12.6 12.0 - 15.0 g/dL   HCT 36.0 36.0 - 46.0 %   MCV 90.9 80.0 - 100.0 fL   MCH 31.8 26.0 - 34.0 pg   MCHC 35.0 30.0 - 36.0 g/dL   RDW 11.8 11.5 - 15.5 %   Platelets 245 150 - 400 K/uL   nRBC 0.0 0.0 - 0.2 %    Comment: Performed at [facility], [place] [address], [place], [place] [address]    Labs:  CBC: Recent Labs  Lab 03/04/22 2247 03/05/22 0526 03/07/22 0235 03/08/22 0247 03/09/22 0158  WBC 7.4 6.7 7.4 8.5 5.6  HGB 12.2 12.4 12.8 12.1 12.6  HCT 36.1 36.7 37.6 35.6* 36.0  MCV 93.5 95.1 92.2 93.0 90.9  PLT 174 173 183 214 759    Basic Metabolic Panel: Recent Labs  Lab 03/04/22 0216  NA 136  K 4.6  CL 103  CO2 24  GLUCOSE 143*  BUN 10  CREATININE 0.76  CALCIUM 8.6*   GFR Estimated Creatinine Clearance: 58.2 mL/min (by C-G formula based on SCr of 0.76 mg/dL). Liver Function Tests: No results for input(s): "AST", "ALT", "ALKPHOS", "BILITOT", "PROT", "ALBUMIN" in the last 168 hours. No results for input(s): "LIPASE", "AMYLASE" in the last 168 hours. No results for input(s): "AMMONIA" in the last 168 hours. Coagulation profile No results for input(s): "INR", "PROTIME" in the last 168 hours.  Cardiac Enzymes: No results for input(s): "CKTOTAL", "CKMB", "CKMBINDEX", "TROPONINI" in the last 168 hours. BNP: Invalid input(s): "POCBNP" CBG: No results for input(s): "GLUCAP" in the last 168 hours. D-Dimer No results for input(s): "DDIMER" in the last 72 hours. Hgb A1c No results for input(s):  "HGBA1C" in the last 72 hours. Lipid Profile No results for input(s): "CHOL", "HDL", "LDLCALC", "TRIG", "CHOLHDL", "LDLDIRECT" in the last 72 hours. Thyroid function studies No results for input(s): "TSH", "T4TOTAL", "T3FREE", "THYROIDAB" in the last 72 hours.  Invalid input(s): "FREET3" Anemia work up No results for input(s): "VITAMINB12", "FOLATE", "FERRITIN", "TIBC", "IRON", "RETICCTPCT" in the last 72 hours. Urinalysis No results found for: "COLORURINE", "APPEARANCEUR", "LABSPEC", "PHURINE", "GLUCOSEU", "HGBUR", "BILIRUBINUR", "KETONESUR", "PROTEINUR", "UROBILINOGEN", "NITRITE", "LEUKOCYTESUR"  Microbiology No results found for this or any previous visit (from the past 240 hour(s)).  Inpatient Medications:   Scheduled Meds:  apixaban  10 mg Oral BID   Followed by   Derrill Memo ON 03/12/2022] apixaban  5 mg Oral BID   docusate sodium  100 mg Oral BID   feeding supplement  1 Container Oral TID BM   Ensure Max Protein  11 oz Oral Daily   PRN Meds: bisacodyl, HYDROcodone-acetaminophen, HYDROmorphone (DILAUDID) injection, iohexol, ondansetron **OR** ondansetron (ZOFRAN) IV, polyvinyl alcohol Continuous Infusions:    Radiological Exams on Admission: No results found.  Thank you for this consultation. [facility] hospitalist team will follow the patient with you.   Time spent: Greater than 50% of the 35 minute visit was spent in counseling/coordination of care for the patient as laid out in the A&P.   Dwyane Dee M.D. [facility] 03/09/2022, 11:50 AM Pager: Secure chat

## 2022-03-09 NOTE — Plan of Care (Signed)
  Problem: Education: Goal: Knowledge of General Education information will improve Description: Including pain rating scale, medication(s)/side effects and non-pharmacologic comfort measures 03/09/2022 0958 by Santa Lighter, RN Outcome: Adequate for Discharge 03/09/2022 0957 by Santa Lighter, RN Outcome: Progressing   Problem: Health Behavior/Discharge Planning: Goal: Ability to manage health-related needs will improve 03/09/2022 0958 by Santa Lighter, RN Outcome: Adequate for Discharge 03/09/2022 0957 by Santa Lighter, RN Outcome: Progressing   Problem: Clinical Measurements: Goal: Ability to maintain clinical measurements within normal limits will improve Outcome: Adequate for Discharge Goal: Will remain free from infection Outcome: Adequate for Discharge Goal: Diagnostic test results will improve Outcome: Adequate for Discharge Goal: Respiratory complications will improve Outcome: Adequate for Discharge Goal: Cardiovascular complication will be avoided Outcome: Adequate for Discharge   Problem: Activity: Goal: Risk for activity intolerance will decrease Outcome: Adequate for Discharge   Problem: Nutrition: Goal: Adequate nutrition will be maintained Outcome: Adequate for Discharge   Problem: Coping: Goal: Level of anxiety will decrease Outcome: Adequate for Discharge   Problem: Elimination: Goal: Will not experience complications related to bowel motility Outcome: Adequate for Discharge Goal: Will not experience complications related to urinary retention Outcome: Adequate for Discharge   Problem: Pain Managment: Goal: General experience of comfort will improve Outcome: Adequate for Discharge   Problem: Safety: Goal: Ability to remain free from injury will improve Outcome: Adequate for Discharge   Problem: Skin Integrity: Goal: Risk for impaired skin integrity will decrease Outcome: Adequate for Discharge

## 2022-03-09 NOTE — Plan of Care (Signed)
  Problem: Education: Goal: Knowledge of General Education information will improve Description Including pain rating scale, medication(s)/side effects and non-pharmacologic comfort measures Outcome: Progressing   Problem: Health Behavior/Discharge Planning: Goal: Ability to manage health-related needs will improve Outcome: Progressing   

## 2022-03-09 NOTE — Progress Notes (Signed)
6 Days Post-Op   Subjective/Chief Complaint: Feeling very well today and ready to go home.  Off all O2 today   Objective: Vital signs in last 24 hours: Temp:  [97.4 F (36.3 C)-98.2 F (36.8 C)] 98.2 F (36.8 C) (02/05 0718) Pulse Rate:  [75-80] 77 (02/05 0718) Resp:  [16-18] 16 (02/05 0718) BP: (127-142)/(70-76) 128/76 (02/05 0718) SpO2:  [94 %-98 %] 98 % (02/05 0718) Last BM Date : 03/02/22  Intake/Output from previous day: 02/04 0701 - 02/05 0700 In: 480 [P.O.:480] Out: 350 [Urine:350] Intake/Output this shift: No intake/output data recorded.  General appearance: alert and cooperative. Sats 98% on RA. GI: soft, non-tender; bowel sounds normal; and inc c/d/i  Lab Results:  Recent Labs    03/08/22 0247 03/09/22 0158  WBC 8.5 5.6  HGB 12.1 12.6  HCT 35.6* 36.0  PLT 214 245   BMET No results for input(s): "NA", "K", "CL", "CO2", "GLUCOSE", "BUN", "CREATININE", "CALCIUM" in the last 72 hours.  PT/INR No results for input(s): "LABPROT", "INR" in the last 72 hours. ABG No results for input(s): "PHART", "HCO3" in the last 72 hours.  Invalid input(s): "PCO2", "PO2"  Studies/Results: No results found.  Anti-infectives: Anti-infectives (From admission, onward)    Start     Dose/Rate Route Frequency Ordered Stop   03/03/22 0600  ceFAZolin (ANCEF) IVPB 2g/100 mL premix        2 g 200 mL/hr over 30 Minutes Intravenous On call to O.R. 03/03/22 0554 03/03/22 0746       Assessment/Plan: s/p Procedure(s): XI ROBOTIC ASSISTED HIATAL HERNIA REPAIR WITH MESH AND FUNDOPLICATION (N/A) POD 6 -on to eliquis for DVT/PE.  Appreciate Medicine assistance. -Encouraged aggressive mobilization and IS -con't pureed diet, will add ensure supplements. Added stool softeners as no BM since surgery.  -resent her post op pain rx to the [facility] per her request -Weaned off O2.  Stable for DC home today   LOS: 4 days    [name] 03/09/2022

## 2022-03-09 NOTE — Progress Notes (Signed)
   03/09/22 1100  Mobility  Level of Assistance Standby assist, set-up cues, supervision of patient - no hands on  Assistive Device Front wheel walker  Distance Ambulated (ft) 40 ft  Activity Response Tolerated well  Mobility Referral Yes  $Mobility charge 1 Mobility   Mobility Specialist Progress Note  Pt was in bed and agreeable. Had no c/o pain. Returned to bed w/ all needs met and call bell in reach.  Lucious Groves Mobility Specialist  Please contact via SecureChat or Rehab office at [phone]

## 2022-03-09 NOTE — Progress Notes (Signed)
Discharge instructions reviewed with pt and husband.  Copy of instructions given to pt. Scripts being filled by [facility] and will be given to pt prior to discharge. Education on special diet/consistencies reviewed with pt/husband and handout provided in d/c packet. Pt getting dressed and to call out when ready.  Pt to be d/c'd via wheelchair with belongings, with husband.          To be escorted by staff/hospital volunteer.

## 2022-03-16 NOTE — Discharge Summary (Signed)
Physician Discharge Summary  Patient ID: [name] MRN: [id number] DOB/AGE: 1945-07-15 77 y.o.  Admit date: 03/03/2022 Discharge date: 03/16/2022  Admission Diagnoses:robo HHR and toupet fundo  Discharge Diagnoses:  Principal Problem:   Hiatal hernia Active Problems:   S/P Nissen fundoplication (without gastrostomy tube) procedure   DVT (deep venous thrombosis) ([place])   Acute pulmonary embolism ([place])   Discharged Condition: good  Hospital Course:Was admitted postoperatively.  Patient had a postoperative esophagram which was negative for leak.  She was started on liquid diet.  Patient had some difficulty with breathing.  She underwent CT scan which was positive for bilateral PEs.  She was started on heparin and eventually transition to Eliquis.  Patient did require some oxygen.  Medicine team was involved to help with her transition from heparin to Eliquis as well as help with oxygen requirements.  Patient was otherwise ambulating on her own.  She eventually was able to come off O2 and was deemed stable for discharge and discharged home.  Consults:  medicine   Significant Diagnostic Studies: radiology: CT scan: bilateral PEs. [place] with DVT  Treatments: surgery: as above and heparin and eliquis for DVT  Discharge Exam: Blood pressure 128/76, pulse 77, temperature 98.2 F (36.8 C), temperature source Oral, resp. rate 16, height 5' 4"$  (1.626 m), weight 79.8 kg, SpO2 98 %. General appearance: alert and cooperative GI: soft, non-tender; bowel sounds normal; no masses,  no organomegaly and inc c/d/i  Disposition: Discharge disposition: 01-Home or Self Care       Discharge Instructions     Diet - low sodium heart healthy   Complete by: As directed    Increase activity slowly   Complete by: As directed       Allergies as of 03/09/2022   No Known Allergies      Medication List     TAKE these medications    acetaminophen 500 MG tablet Commonly known as:  TYLENOL Take 1,000 mg by mouth every 8 (eight) hours as needed (pain.).   B-complex with vitamin C tablet Take 1 tablet by mouth daily.   carboxymethylcellulose 0.5 % Soln Commonly known as: REFRESH PLUS Place 1 drop into both eyes 3 (three) times daily as needed (dry/irritated eyes.).   cetirizine 10 MG tablet Commonly known as: ZYRTEC Take 10 mg by mouth daily as needed (seasonal allergies.).   COLLAGEN PO Take 2 Scoops by mouth daily. Mix with coffee (Collagen Peptides)   Eliquis DVT/PE Starter Pack Generic drug: Apixaban Starter Pack (43m and 561m Take 2 tablets (1061mtwice daily for 7 days, then take 1 tablet (5mg70mwice daily.   apixaban 5 MG Tabs tablet Commonly known as: ELIQUIS Take 1 tablet by mouth twice daily.   ferrous sulfate 325 (65 FE) MG tablet Take 325 mg by mouth daily with breakfast.   fluorouracil 5 % cream Commonly known as: EFUDEX Apply 1 Application topically 2 (two) times daily as needed (skin lesions).   Fosamax 70 MG tablet Generic drug: alendronate Take 70 mg by mouth every Sunday.   Lumify 0.025 % Soln Generic drug: Brimonidine Tartrate Place 1 drop into both eyes 2 (two) times daily as needed (dry/irritated eyes.).   Magnesium 250 MG Tabs Take 250 mg by mouth daily.   melatonin 5 MG Tabs Take 5 mg by mouth at bedtime.   metoprolol succinate 25 MG 24 hr tablet Commonly known as: TOPROL-XL Take 25 mg by mouth at bedtime.   METRONIDAZOLE (TOPICAL) 0.75 % Lotn Apply  1 Application topically daily as needed (rosacea).   Misc Intestinal Flora Regulat Caps Take 1 capsule by mouth daily.   multivitamin with minerals Tabs tablet Take 1 tablet by mouth daily.   omeprazole 20 MG capsule Commonly known as: PRILOSEC Take 20 mg by mouth 2 (two) times daily.   PRESERVISION AREDS 2 PO Take 1 tablet by mouth in the morning and at bedtime.   rosuvastatin 10 MG tablet Commonly known as: CRESTOR Take 10 mg by mouth at bedtime.    tretinoin 0.05 % cream Commonly known as: RETIN-A Apply 1 application  topically daily as needed (rosacea.).   TURMERIC PO Take 1 capsule by mouth daily.   VITAMIN B-12 PO Take 1,000 mcg by mouth daily.   VITAMIN D-3 PO Take 1 tablet by mouth in the morning.       ASK your doctor about these medications    HYDROcodone-acetaminophen 7.5-325 mg/15 ml solution Commonly known as: HYCET Take 10 mLs by mouth 4 (four) times daily as needed for up to 5 days for moderate pain. Ask about: Should I take this medication?        Follow-up Information     Ralene Ok, MD. Schedule an appointment as soon as possible for a visit.   Specialty: General Surgery Why: Post op visit Contact information: [address] [place] [address] [phone]                 Signed: Ralene Ok 03/16/2022, 8:59 AM

## 2022-03-20 ENCOUNTER — Other Ambulatory Visit: Payer: Self-pay | Admitting: Cardiology

## 2022-03-20 DIAGNOSIS — I2699 Other pulmonary embolism without acute cor pulmonale: Secondary | ICD-10-CM

## 2022-03-20 DIAGNOSIS — I361 Nonrheumatic tricuspid (valve) insufficiency: Secondary | ICD-10-CM

## 2022-03-20 MED ORDER — APIXABAN 5 MG PO TABS: ORAL_TABLET | ORAL | 2 refills | Status: DC

## 2022-03-23 DIAGNOSIS — Z09 Encounter for follow-up examination after completed treatment for conditions other than malignant neoplasm: Secondary | ICD-10-CM | POA: Diagnosis not present

## 2022-03-23 DIAGNOSIS — R0683 Snoring: Secondary | ICD-10-CM | POA: Diagnosis not present

## 2022-03-23 DIAGNOSIS — K449 Diaphragmatic hernia without obstruction or gangrene: Secondary | ICD-10-CM | POA: Diagnosis not present

## 2022-03-31 DIAGNOSIS — R197 Diarrhea, unspecified: Secondary | ICD-10-CM | POA: Diagnosis not present

## 2022-04-05 DIAGNOSIS — R040 Epistaxis: Secondary | ICD-10-CM | POA: Diagnosis not present

## 2022-04-07 DIAGNOSIS — J3489 Other specified disorders of nose and nasal sinuses: Secondary | ICD-10-CM | POA: Diagnosis not present

## 2022-04-07 DIAGNOSIS — J323 Chronic sphenoidal sinusitis: Secondary | ICD-10-CM | POA: Diagnosis not present

## 2022-04-23 DIAGNOSIS — D1801 Hemangioma of skin and subcutaneous tissue: Secondary | ICD-10-CM | POA: Diagnosis not present

## 2022-04-23 DIAGNOSIS — D225 Melanocytic nevi of trunk: Secondary | ICD-10-CM | POA: Diagnosis not present

## 2022-04-23 DIAGNOSIS — L570 Actinic keratosis: Secondary | ICD-10-CM | POA: Diagnosis not present

## 2022-04-23 DIAGNOSIS — L821 Other seborrheic keratosis: Secondary | ICD-10-CM | POA: Diagnosis not present

## 2022-04-23 DIAGNOSIS — Z85828 Personal history of other malignant neoplasm of skin: Secondary | ICD-10-CM | POA: Diagnosis not present

## 2022-04-23 DIAGNOSIS — D485 Neoplasm of uncertain behavior of skin: Secondary | ICD-10-CM | POA: Diagnosis not present

## 2022-04-23 DIAGNOSIS — C44219 Basal cell carcinoma of skin of left ear and external auricular canal: Secondary | ICD-10-CM | POA: Diagnosis not present

## 2022-04-23 DIAGNOSIS — D0471 Carcinoma in situ of skin of right lower limb, including hip: Secondary | ICD-10-CM | POA: Diagnosis not present

## 2022-04-30 DIAGNOSIS — R197 Diarrhea, unspecified: Secondary | ICD-10-CM | POA: Diagnosis not present

## 2022-04-30 DIAGNOSIS — Z6830 Body mass index (BMI) 30.0-30.9, adult: Secondary | ICD-10-CM | POA: Diagnosis not present

## 2022-04-30 DIAGNOSIS — Z7901 Long term (current) use of anticoagulants: Secondary | ICD-10-CM | POA: Diagnosis not present

## 2022-04-30 DIAGNOSIS — I872 Venous insufficiency (chronic) (peripheral): Secondary | ICD-10-CM | POA: Diagnosis not present

## 2022-05-04 ENCOUNTER — Other Ambulatory Visit: Payer: Self-pay

## 2022-05-04 DIAGNOSIS — I2699 Other pulmonary embolism without acute cor pulmonale: Secondary | ICD-10-CM

## 2022-05-04 MED ORDER — APIXABAN 5 MG PO TABS: ORAL_TABLET | ORAL | 2 refills | Status: DC

## 2022-05-07 DIAGNOSIS — R195 Other fecal abnormalities: Secondary | ICD-10-CM | POA: Diagnosis not present

## 2022-05-07 DIAGNOSIS — M545 Low back pain, unspecified: Secondary | ICD-10-CM | POA: Diagnosis not present

## 2022-05-07 DIAGNOSIS — R140 Abdominal distension (gaseous): Secondary | ICD-10-CM | POA: Diagnosis not present

## 2022-05-14 ENCOUNTER — Ambulatory Visit: Payer: Medicare Other | Admitting: Cardiology

## 2022-05-14 DIAGNOSIS — R040 Epistaxis: Secondary | ICD-10-CM | POA: Diagnosis not present

## 2022-05-19 DIAGNOSIS — L820 Inflamed seborrheic keratosis: Secondary | ICD-10-CM | POA: Diagnosis not present

## 2022-05-19 DIAGNOSIS — Z85828 Personal history of other malignant neoplasm of skin: Secondary | ICD-10-CM | POA: Diagnosis not present

## 2022-05-20 DIAGNOSIS — R197 Diarrhea, unspecified: Secondary | ICD-10-CM | POA: Diagnosis not present

## 2022-06-02 DIAGNOSIS — Z961 Presence of intraocular lens: Secondary | ICD-10-CM | POA: Diagnosis not present

## 2022-06-10 ENCOUNTER — Encounter: Payer: Self-pay | Admitting: Internal Medicine

## 2022-06-10 ENCOUNTER — Telehealth: Payer: Self-pay | Admitting: Internal Medicine

## 2022-06-10 NOTE — Telephone Encounter (Signed)
Hi Dr. Leone Payor,  [name]. Said she was referred to you by Dr. Sampson Goon (Anesthesiologist) who is a really good friend her family. She also has GI history with Dr. [name] and recently had a hernia procedure in January. We are trying to obtain all her records for you to review but we are having a bit of a delay with request. She will be stopping by tomorrow to bring what she has available. Some records look like are available in Epic Care Everywhere for you to review, As mentioned she is on your schedule for next available in September for now until you advise further on scheduling.    Thank you

## 2022-06-11 DIAGNOSIS — C44219 Basal cell carcinoma of skin of left ear and external auricular canal: Secondary | ICD-10-CM | POA: Diagnosis not present

## 2022-06-16 ENCOUNTER — Encounter: Payer: Self-pay | Admitting: Internal Medicine

## 2022-06-16 ENCOUNTER — Other Ambulatory Visit (INDEPENDENT_AMBULATORY_CARE_PROVIDER_SITE_OTHER): Payer: Medicare Other

## 2022-06-16 ENCOUNTER — Ambulatory Visit (INDEPENDENT_AMBULATORY_CARE_PROVIDER_SITE_OTHER): Payer: Medicare Other | Admitting: Internal Medicine

## 2022-06-16 VITALS — BP 126/80 | HR 47 | Ht 64.0 in | Wt 169.4 lb

## 2022-06-16 DIAGNOSIS — R194 Change in bowel habit: Secondary | ICD-10-CM

## 2022-06-16 DIAGNOSIS — K6289 Other specified diseases of anus and rectum: Secondary | ICD-10-CM

## 2022-06-16 DIAGNOSIS — R159 Full incontinence of feces: Secondary | ICD-10-CM | POA: Diagnosis not present

## 2022-06-16 DIAGNOSIS — K9289 Other specified diseases of the digestive system: Secondary | ICD-10-CM | POA: Diagnosis not present

## 2022-06-16 DIAGNOSIS — Z9889 Other specified postprocedural states: Secondary | ICD-10-CM

## 2022-06-16 LAB — COMPREHENSIVE METABOLIC PANEL
ALT: 11 U/L (ref 0–35)
AST: 19 U/L (ref 0–37)
Albumin: 4.1 g/dL (ref 3.5–5.2)
Alkaline Phosphatase: 51 U/L (ref 39–117)
BUN: 15 mg/dL (ref 6–23)
CO2: 30 mEq/L (ref 19–32)
Calcium: 9.7 mg/dL (ref 8.4–10.5)
Chloride: 102 mEq/L (ref 96–112)
Creatinine, Ser: 0.72 mg/dL (ref 0.40–1.20)
GFR: 79.6 mL/min (ref >60.00)
Glucose, Bld: 98 mg/dL (ref 70–99)
Potassium: 4.1 mEq/L (ref 3.5–5.1)
Sodium: 139 mEq/L (ref 135–145)
Total Bilirubin: 0.6 mg/dL (ref 0.2–1.2)
Total Protein: 7.1 g/dL (ref 6.0–8.3)

## 2022-06-16 LAB — CBC WITH DIFFERENTIAL/PLATELET
Basophils Absolute: 0 10*3/uL (ref 0.0–0.1)
Basophils Relative: 0.3 % (ref 0.0–3.0)
Eosinophils Absolute: 0 10*3/uL (ref 0.0–0.7)
Eosinophils Relative: 1.2 % (ref 0.0–5.0)
HCT: 39 % (ref 36.0–46.0)
Hemoglobin: 13.1 g/dL (ref 12.0–15.0)
Lymphocytes Relative: 25.1 % (ref 12.0–46.0)
Lymphs Abs: 1 10*3/uL (ref 0.7–4.0)
MCHC: 33.6 g/dL (ref 30.0–36.0)
MCV: 92 fl (ref 78.0–100.0)
Monocytes Absolute: 0.3 10*3/uL (ref 0.1–1.0)
Monocytes Relative: 8.3 % (ref 3.0–12.0)
Neutro Abs: 2.6 10*3/uL (ref 1.4–7.7)
Neutrophils Relative %: 65.1 % (ref 43.0–77.0)
Platelets: 292 10*3/uL (ref 150.0–400.0)
RBC: 4.24 Mil/uL (ref 3.87–5.11)
RDW: 13.2 % (ref 11.5–15.5)
WBC: 4 10*3/uL (ref 4.0–10.5)

## 2022-06-16 LAB — TSH: TSH: 0.6 u[IU]/mL (ref 0.35–5.50)

## 2022-06-16 NOTE — Telephone Encounter (Signed)
Pt made aware of Dr. Leone Payor recommendations: Pt was scheduled for today at 2:30 PM: Pt made aware: Pt verbalized understanding with all questions answered.

## 2022-06-16 NOTE — Telephone Encounter (Signed)
I have an opening today - please offer to her

## 2022-06-16 NOTE — Progress Notes (Signed)
[name] 77 y.o. January 11, 1946 [id number]  Assessment & Plan:   Encounter Diagnoses  Name Primary?   Gas bloat syndrome Yes   S/P laparoscopic Toupet fundoplication    Full incontinence of feces    Altered bowel habits    Decreased anal sphincter tone     Very complicated situation.  She definitely has signs and symptoms of gas bloat syndrome after hiatal hernia repair.  She had a partial fundoplication so it should be less likely to have this problem but I think she has it.  On top of that she has a lot of bowel habit issues and she has signs of anal sphincter incompetence and history of pelvic floor dysfunction.  When she had simulated defecation it felt like pelvic organs were pressing on the rectum.  She had 1 PT appointment in 2023 but reports what she did after that was very successful and controlled urinary symptoms.  She did not recall or report that she was having fecal issues but the PT report clearly indicates that she had issues with incomplete defecation and inadequate wiping and cleansing with fecal residual in the perineum.  I saw that after she left so we did not review that today.  I think she may be complicating things by understandably using loperamide when she has her multiple bowel movement days.  I am going to evaluate as below with the following tests.  The gastric emptying study is to check gastric motility status post fundoplication.  I suppose it is possible though it does not sound like a vagal nerve injury has taken place but with all of these signs and symptoms it is worth checking.  I mentioned but did not pursue repeat physical therapy.  I think that is going to be important though may not be enough, her anal sphincter seems very weak.  MR Depacon prophy may be in order.  She is to stop pre and probiotics.  They are not helping and I suspect they would not.    Pristine toilet paper spray for wiping is recommended.    Benefiber 1 tablespoon daily to  try to provide bulkier stools and more effective defecation to reduce these days of multiple unloading of stools and incontinence issues.  She is using Gas-X and she can increase that to 4 times daily.  She is to try to minimize Imodium use.  Follow-up plans pending these results.  Other possible investigations could be repeat barium swallow versus upper GI series versus EGD.  I do not think a colonoscopy is going to be indicated but if her calprotectin is elevated we might need to look for inflammatory bowel disease.  Overall these problems really began after her hiatal hernia repair though I do think she had pre-existing pelvic floor dysfunction that is a part of her problem now also.  Orders Placed This Encounter  Procedures   Calprotectin, Fecal   NM Gastric Emptying   CBC with Differential/Platelet   Comprehensive metabolic panel   TSH    CC: Soundra Pilon, FNP Dr. Axel Filler  Subjective:   Chief Complaint: Change in bowel habits with bloating gas constipation and diarrhea and fecal incontinence  HPI 77 year old white woman presents with her husband because of the above complaints, these all seem to have begun after a laparoscopic hiatal hernia repair and toupee fundoplication on March 03, 2022.  She reports that about a year ago she developed COVID while in [place] and had a lot of coughing.  She  had some persistent problems and was evaluated by ENT.  Indication for a barium swallow in November 2023 was a fixed large hiatal hernia with 75% of the stomach intrathoracic, and extrinsic mass effect on the thoracic esophagus by the large hiatal hernia with a near horizontal course of a portion of the mid thoracic esophagus and transient delay in passage of the swallowed barium tablet.  Moderate dysmotility of the esophagus and mild reflux esophagitis suspected.  She was referred to Dr. Derrell Lolling.  She actually saw him in initially in May 2023 after a CT scan demonstrated a large  hiatal hernia.  He recommended maximizing medical therapy at that time.  After the barium swallow she returned to Dr. Derrell Lolling in December 2023.  She reported some dysphagia at that time and some coughing still and it was decided to perform hiatal hernia repair.  On March 03, 2022 she did have hernia repair robotic assisted hiatal hernia repair with mesh and toupee fundoplication.  She suffered a pulmonary embolism afterwards and is on Eliquis since then.  On January 31 she had a postop water-soluble contrast esophagram that did not show a leak there was mildly delayed passage of contrast from the distal esophagus into the stomach and moderate esophageal dysmotility.  Over time she developed increasing problems with postprandial bloating occurring an hour or so after eating borborygmi and irregular erratic bowel habits.  None of the symptoms existed prior to the surgery.  She is unable to belch.  She has not vomited.  Sometimes she has right upper quadrant pain.  She passes flatus 20 times a day or more and is malodorous.  She will go for days with poor defecation and then have explosive soft stools with fecal incontinence.  This happens at least a couple of times a month.  She will take Imodium usually 1 or 2 tablets when she has the soft mushy diarrhea type problems, and then may be sometimes up to 4 in a day though that is rare.  Then she has some issues where she does not really have stool except for some orange secretion leakage in between.  This has persisted and not resolved despite probiotics prebiotic's FODMAPs diet gluten-free and a course of metronidazole.  Through primary care on April 17 she had C. difficile toxin gene NAA negative stool culture negative.  She does not drink carbonated beverages and she uses just a little bit of treatment sweetener with 1 or 2 cups of decaf coffee a day but she did that prior to surgery as well.  She does have a history of going to pelvic floor physical therapy  last year due to urinary urgency and incontinence and had successful resolution of her urinary symptoms.  She was not having bowel habit problems then.  She had been referred to PT by Dr. Leda Quail and 02/21/2021 exam documents normal anal sphincter tone. Below is the history of 07/15/2021 PT evaluation she only had 1 visit.  I have not pasted the physical evaluation but she did have stool externally on the perineum, and she reported she had to shower after defecation to fully clean, and also had significantly decreased strength coordination and endurance of pelvic floor muscles. BOWEL MOVEMENT Pain with bowel movement: No Type of bowel movement:Type (Bristol Stool Scale) 4, Frequency daily, and Strain No Fully empty rectum: Yes: most of the time fully empty but sometimes feels like she needs to go a second time Leakage: No Pads: No Fiber supplement: No   URINATION  Pain with urination: No Fully empty bladder: No sometimes waits a little bit after feeling done then more empties Stream: Strong Urgency: Yes:   Frequency: 2-3 hours sometimes but less when drinking irritants, doesn't get up at night but sometimes does get up at 5am to urinate.  Leakage: Urge to void and Walking to the bathroom Pads: Yes: 1-2 pad per day    Review of systems positive for being on Eliquis for the PE, there is some question whether or not she might have some atrial fibrillation issues and she is following with cardiology.  She had Mohs surgery on her ear (left) for skin cancer recently.  Otherwise negative.   Previous endoscopic evaluations, former patient of Dr. [name]:  Colonoscopy 05/01/2021 3 polyps in the ascending colon each were 6 mm, sigmoid and descending diverticulosis.  He did have a history of polyps in the past as well.  Polyps were tubular adenomas and lymphoid tissue  EGD 2014 healed gastric ulcer (NSAID induced) 3 cm hiatal hernia described. No Known Allergies Current Meds  Medication Sig    apixaban (ELIQUIS) 5 MG TABS tablet Take 1 tablet by mouth twice daily.   Brimonidine Tartrate (LUMIFY) 0.025 % SOLN Place 1 drop into both eyes 2 (two) times daily as needed (dry/irritated eyes.).   carboxymethylcellulose (REFRESH PLUS) 0.5 % SOLN Place 1 drop into both eyes 3 (three) times daily as needed (dry/irritated eyes.).   cetirizine (ZYRTEC) 10 MG tablet Take 10 mg by mouth daily as needed (seasonal allergies.).   COLLAGEN PO Take 2 Scoops by mouth daily. Mix with coffee (Collagen Peptides)   fluorouracil (EFUDEX) 5 % cream Apply 1 Application topically 2 (two) times daily as needed (skin lesions).   Magnesium 250 MG TABS Take 250 mg by mouth daily.   melatonin 5 MG TABS Take 5 mg by mouth at bedtime.   metoprolol succinate (TOPROL-XL) 25 MG 24 hr tablet Take 25 mg by mouth at bedtime.   METRONIDAZOLE, TOPICAL, 0.75 % LOTN Apply 1 Application topically daily as needed (rosacea).   rosuvastatin (CRESTOR) 10 MG tablet Take 10 mg by mouth at bedtime.   tretinoin (RETIN-A) 0.05 % cream Apply 1 application  topically daily as needed (rosacea.).   [DISCONTINUED] Cholecalciferol (VITAMIN D-3 PO) Take 1 tablet by mouth in the morning.   [DISCONTINUED] ferrous sulfate 325 (65 FE) MG tablet Take 325 mg by mouth daily with breakfast.   [DISCONTINUED] Multiple Vitamin (MULTIVITAMIN WITH MINERALS) TABS tablet Take 1 tablet by mouth daily.   [DISCONTINUED] Multiple Vitamins-Minerals (PRESERVISION AREDS 2 PO) Take 1 tablet by mouth in the morning and at bedtime.   [DISCONTINUED] Probiotic Product (MISC INTESTINAL FLORA REGULAT) CAPS Take 1 capsule by mouth daily.   [DISCONTINUED] TURMERIC PO Take 1 capsule by mouth daily.   Past Medical History:  Diagnosis Date   Anemia    Ascending aorta dilatation (HCC)    Atrial dilatation    Bartholin cyst    Compression fracture    lumbar    Constipation    Disorder of bone density and structure, unspecified    Fatty liver    Hearing loss    History of  colon polyps    History of hiatal hernia    History of SCC (squamous cell carcinoma) of skin    Hypertension    Liver lesion    Mild cardiomegaly    Mitral regurgitation    Multinodular goiter    Obesity    Other iron deficiency anemia  Primary osteoarthritis    Pulmonary hypertension (HCC)    Pure hypercholesterolemia    Scoliosis    Sensory - neural hearing loss    right   Tremor    Tricuspid regurgitation    Vitamin D deficiency    Past Surgical History:  Procedure Laterality Date   APPENDECTOMY     BONE ANCHORED HEARING AID IMPLANT Right 10/10/2014   Procedure: BONE ANCHORED HEARING AID (BAHA) IMPLANT TEMPORAL BONE RIGHT EAR;  Surgeon: Ermalinda Barrios, MD;  Location:  SURGERY CENTER;  Service: ENT;  Laterality: Right;   BREAST REDUCTION SURGERY     CARPAL TUNNEL RELEASE Right    CATARACT EXTRACTION Bilateral    ROTATOR CUFF REPAIR Right    STAPEDECTOMY Bilateral    has metal implants (1 in each ear) - pt cannot have a MRI of her head/brain   UMBILICAL HERNIA REPAIR     XI ROBOTIC ASSISTED HIATAL HERNIA REPAIR N/A 03/03/2022   Procedure: XI ROBOTIC ASSISTED HIATAL HERNIA REPAIR WITH MESH AND FUNDOPLICATION;  Surgeon: Axel Filler, MD;  Location: [facility] OR;  Service: General;  Laterality: N/A;   Social History   Social History Narrative   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   Review of Systems  See HPI Objective:   Physical Exam @BP  126/80   Pulse (!) 47   Ht 5\' 4"  (1.626 m)   Wt 169 lb 6.4 oz (76.8 kg)   BMI 29.08 kg/m @  General:  Well-developed, well-nourished and in no acute distress Eyes:  anicteric. ENT:   Mouth and posterior pharynx free of lesions.  Neck:   supple w/o thyromegaly or mass.  Lungs: Clear to auscultation bilaterally. Heart:  S1S2, no rubs, murmurs, gallops. Abdomen:  soft, non-tender, no  hepatosplenomegaly, hernia, or mass and BS+ - increased + tympanitic upper Rectal:  Patti [place], CMA present.  Feces at anus - soft and brown  Decreased resting and vol anal tone (marked) No mass, rectocele Simulated defecation w/ decreased descent and suspected pressure from pelvic organs     Neuro:  A&O x 3.  Psych:  appropriate mood and  Affect.   Data Reviewed: See HPI

## 2022-06-16 NOTE — Patient Instructions (Addendum)
Your provider has requested that you go to the basement level for lab work before leaving today. Press "B" on the elevator. The lab is located at the first door on the left as you exit the elevator.  Due to recent changes in healthcare laws, you may see the results of your imaging and laboratory studies on MyChart before your provider has had a chance to review them.  We understand that in some cases there may be results that are confusing or concerning to you. Not all laboratory results come back in the same time frame and the provider may be waiting for multiple results in order to interpret others.  Please give [place] 48 hours in order for your provider to thoroughly review all the results before contacting the office for clarification of your results.   Stop your pre and pro biotics.  Take over the counter Gas-X as follows: 2 tablets four times a day.   Use over the counter Imodium on bad days.  Paediatric nurse paper spray from [employer] or [employer].. Works great, spray on the toilet paper and it helps to clean after bowel movements.   Take a tablespoon of Benefiber daily.  You have been scheduled for a gastric emptying scan at [facility] on 07/08/2022 at 7:30AM. Please arrive at least 15 minutes prior to your appointment for registration. Please make certain not to have anything to eat or drink after midnight the night before your test. Hold all stomach medications (ex: Zofran, phenergan, Reglan) 24 hours prior to your test. If you need to reschedule your appointment, please contact radiology scheduling at [phone]. _____________________________________________________________________ A gastric-emptying study measures how long it takes for food to move through your stomach. There are several ways to measure stomach emptying. In the most common test, you eat food that contains a small amount of radioactive material. A scanner that detects the movement of the radioactive  material is placed over your abdomen to monitor the rate at which food leaves your stomach. This test normally takes about 4 hours to complete. _____________________________________________________________________   I appreciate the opportunity to care for you. Stan Head, MD, [facility]

## 2022-06-17 ENCOUNTER — Telehealth: Payer: Self-pay | Admitting: Internal Medicine

## 2022-06-17 DIAGNOSIS — R159 Full incontinence of feces: Secondary | ICD-10-CM

## 2022-06-17 NOTE — Telephone Encounter (Signed)
Patient called requesting to speak with you to follow up from yesterday's visit.

## 2022-06-17 NOTE — Telephone Encounter (Signed)
She wanted to let you know she took a warm bath last night and then had a bowel "explosion". Diarrhea this AM and then she had a normal bowel movement. She thinks you doing the rectal exam yesterday broke it loose. She just wanted to share the good news.

## 2022-06-18 ENCOUNTER — Ambulatory Visit: Payer: Medicare Other

## 2022-06-18 DIAGNOSIS — I361 Nonrheumatic tricuspid (valve) insufficiency: Secondary | ICD-10-CM | POA: Diagnosis not present

## 2022-06-19 ENCOUNTER — Ambulatory Visit: Payer: Medicare Other

## 2022-06-19 DIAGNOSIS — K9289 Other specified diseases of the digestive system: Secondary | ICD-10-CM

## 2022-06-19 DIAGNOSIS — R159 Full incontinence of feces: Secondary | ICD-10-CM | POA: Diagnosis not present

## 2022-06-19 DIAGNOSIS — R194 Change in bowel habit: Secondary | ICD-10-CM

## 2022-06-19 DIAGNOSIS — Z9889 Other specified postprocedural states: Secondary | ICD-10-CM

## 2022-06-24 MED ORDER — HYOSCYAMINE SULFATE 0.125 MG SL SUBL: 0.1250 mg | SUBLINGUAL_TABLET | SUBLINGUAL | 1 refills | Status: DC | PRN

## 2022-06-24 NOTE — Telephone Encounter (Signed)
Meds ordered this encounter  Medications   hyoscyamine (LEVSIN SL) 0.125 MG SL tablet    Sig: Place 1 tablet (0.125 mg total) under the tongue every 4 (four) hours as needed for cramping (gas and bloating).    Dispense:  60 tablet    Refill:  1   Patient may use the above medication as needed gas and cramping  Explained to her that after her visit I reviewed her chart more and I recommend she return to pelvic floor physical therapy regarding her incontinence issues.  She went 1 time.  They bowel leakage and problems she is having would be helped by PT, I think.    Please make a referral.

## 2022-06-24 NOTE — Telephone Encounter (Signed)
Patient called to follow up said she is getting a lot of gas and she barely slept last night. Would like to get further advise because the gas is unbearable.

## 2022-06-24 NOTE — Addendum Note (Signed)
Addended by: [place], Jamichael Knotts E on: 06/24/2022 02:56 PM   Modules accepted: Orders

## 2022-06-24 NOTE — Addendum Note (Signed)
Addended by: Iva Boop on: 06/24/2022 01:07 PM   Modules accepted: Orders

## 2022-06-24 NOTE — Telephone Encounter (Signed)
I have left [name] a detailed message on her voice mail about the rx and plan and I put the P.T. order into epic.

## 2022-06-25 NOTE — Telephone Encounter (Signed)
Patient called wanted inform she has had food night sleep, no gas, no diarrhea, tiny stool.

## 2022-06-25 NOTE — Telephone Encounter (Signed)
I called her and told her thanks for calling with the update. She said she has a pelvic floor P.T. appointment early July and she is on a cancellation list for them as well.

## 2022-06-26 LAB — CALPROTECTIN, FECAL: Calprotectin, Fecal: 12 ug/g (ref 0–120)

## 2022-07-03 ENCOUNTER — Telehealth: Payer: Self-pay | Admitting: Internal Medicine

## 2022-07-03 DIAGNOSIS — N390 Urinary tract infection, site not specified: Secondary | ICD-10-CM | POA: Diagnosis not present

## 2022-07-03 NOTE — Telephone Encounter (Signed)
Inbound call from patient requesting to speak with a nurse.. she said she is having urinary infection and she is schedule for a NM GASTRIC EMPTYING on 6/5 and what to know if she will have to reschedule appt. Please advise

## 2022-07-03 NOTE — Telephone Encounter (Signed)
Spoke with patient & she stated she thought she had a UTI, and wanted to know if her GES should be rescheduled. Advised her to contact PCP office regarding UTI, and provided her with radiology scheduling number if she feels that symptoms may interfere with her being able to have study done. Pt verbalized all understanding.

## 2022-07-08 ENCOUNTER — Encounter (HOSPITAL_COMMUNITY)
Admission: RE | Admit: 2022-07-08 | Discharge: 2022-07-08 | Disposition: A | Discharge status: Home / Self Care | Payer: Medicare Other | Source: Ambulatory Visit | Attending: Internal Medicine | Admitting: Internal Medicine

## 2022-07-08 DIAGNOSIS — Z9889 Other specified postprocedural states: Secondary | ICD-10-CM | POA: Diagnosis not present

## 2022-07-08 DIAGNOSIS — R140 Abdominal distension (gaseous): Secondary | ICD-10-CM | POA: Diagnosis not present

## 2022-07-08 DIAGNOSIS — K9289 Other specified diseases of the digestive system: Secondary | ICD-10-CM | POA: Diagnosis not present

## 2022-07-08 MED ORDER — TECHNETIUM TC 99M SULFUR COLLOID
2.2000 | Freq: Once | INTRAVENOUS | Status: AC
  Administered 2022-07-08: 2.2 via ORAL

## 2022-07-09 NOTE — Telephone Encounter (Signed)
I have spoken to patient to advise that GES results from yesterdasy are in Dr Marvell Fuller inbox for review. Advised that we will contact her with recommendations after he has reviewed her study. Patient wanted to make [place] aware that she has been placed on antibiotics for UTI and has been experiencing diarrhea again following first dose of meds.

## 2022-07-09 NOTE — Telephone Encounter (Signed)
PT had GES yesterday and wanted to update [place] that she has a UTI and was antibiotics. Wants to know what are the next steps. Please advise

## 2022-07-10 ENCOUNTER — Telehealth: Payer: Self-pay | Admitting: Internal Medicine

## 2022-07-10 DIAGNOSIS — R194 Change in bowel habit: Secondary | ICD-10-CM

## 2022-07-10 NOTE — Telephone Encounter (Signed)
Relayed results of gastric emptying study there is minimal 3 and 4-hour delayed emptying.  I do not think it is really significant overall and probably unrelated to her symptoms.  I explained that she has 2 sets of issues it sounds like she has some gas bloat syndrome that could be related to the fundoplication but she has signs and symptoms of pelvic disorder function with disordered defecation still.  I explained that the mucus she sees is not a pathologic issue in my mind it is probably related to the bowel dysfunction that is functional.  Fecal calprotectin was only 12 so there is not an inflammatory process and she had a colonoscopy by Dr. [name] in 2023 so we do not need to repeat that.  I think a defecography exam with MRI would be useful though she had a stapedectomy many years ago and thinks she has metal in her ear or ears.  She has never had an MRI that we know she also has titanium in her body though that should not be an issue.  We will ask radiology what the next steps would be to see how to screen for possible pelvic MR and go from there.  She has gynecologic follow-up with Dr. Leda Quail June 19.  She has a pelvic floor physical therapy appointment in July.  She also has at home pelvic PT video program that she may employ.  Though she is having some frequent stools that are loosening after an initial more formed 1, incontinence issues are much less of a problem fortunately.

## 2022-07-13 NOTE — Telephone Encounter (Signed)
[name] informed and will come tomorrow for the KUB.

## 2022-07-13 NOTE — Telephone Encounter (Signed)
Spoke with [name] in MRI dept at [facility]. They need to know the model name and model # of all implants in her ears before they could say yes or no to a future MRI.

## 2022-07-13 NOTE — Telephone Encounter (Signed)
PT requesting a call back to further discuss information from earlier.

## 2022-07-13 NOTE — Telephone Encounter (Signed)
I called and spoke to [name] and she said the stapedectomy was done when she was in her early 20's or 3's  and she has NO INFO of model name and # of the wires in there. I told her I will pass on this information and we will be back in touch.    While on the phone she wanted to update Dr Leone Payor: So far today, it is almost 12 noon she has had 3 Imodium. Today at [facility] and at 8AM she had normal BM's and then she had 1.5 hours of 6 diarrhea episodes. She wonders if she has crohn's or IBS. She said she didn't have issues until she had her hernia surgery. She said perhaps Dr Leone Payor could talk with Dr Derrell Lolling who did her surgery.

## 2022-07-13 NOTE — Telephone Encounter (Signed)
We will not do the MRI then  She does not appear to have Crohn's as I explained last week - calprotectin test which looks for inflammation in bowels was normal  She does have symptoms of IBS but I think these are largely related to pelvic floor dysfunction and the pelvic floor PT is the next step - she has a pending appointment  Have her come do a KUB - I ordered it want to see if there is a large stool burden

## 2022-07-14 ENCOUNTER — Ambulatory Visit (INDEPENDENT_AMBULATORY_CARE_PROVIDER_SITE_OTHER)
Admission: RE | Admit: 2022-07-14 | Discharge: 2022-07-14 | Disposition: A | Discharge status: Home / Self Care | Payer: Medicare Other | Source: Ambulatory Visit | Attending: Internal Medicine | Admitting: Internal Medicine

## 2022-07-14 DIAGNOSIS — R140 Abdominal distension (gaseous): Secondary | ICD-10-CM | POA: Diagnosis not present

## 2022-07-14 DIAGNOSIS — R194 Change in bowel habit: Secondary | ICD-10-CM

## 2022-07-15 ENCOUNTER — Ambulatory Visit: Payer: Medicare Other | Admitting: Cardiology

## 2022-07-15 ENCOUNTER — Encounter: Payer: Self-pay | Admitting: Cardiology

## 2022-07-15 VITALS — BP 119/67 | HR 76 | Ht 64.0 in | Wt 171.0 lb

## 2022-07-15 DIAGNOSIS — I2699 Other pulmonary embolism without acute cor pulmonale: Secondary | ICD-10-CM | POA: Diagnosis not present

## 2022-07-15 DIAGNOSIS — I340 Nonrheumatic mitral (valve) insufficiency: Secondary | ICD-10-CM

## 2022-07-15 NOTE — Progress Notes (Signed)
Patient referred by Sigmund Hazel, MD for Ascending aorta dilatation, severe LA enlargement  Subjective:   [name], female    DOB: August 18, 1945, 77 y.o.   MRN: [id number]   Chief Complaint  Patient presents with   Pulmonary embolism     HPI  77 year old Caucasian female with  hyperlipidemia, chronic cough, hiatal hernia, h/o perioperative DVT/PE (03/2022).  Patient had DVT/PE after Nissen fundoplication surgery in 03/2022. She is now on Eliquis. She has had ongoing gastric issues with bloating sensation. She is seeing the surgeons for the same. She has had increased fatigue since then.  Reviewed recent test results with the patient, details below.     Initial consultation visit 04/2021: Patient is here with her husband.  She is very pleasant lady, who is fairly active with regular walking etc.  Ever since she had COVID few months ago, she is experienced chronic cough and congestion in the sinuses to the point that her voice is changed.  On work-up for chronic cough, she was found to have a large hiatal hernia.  She was scheduled to undergo colonoscopy as well as EGD with gastroenterology.  However, screening echocardiogram showed certain abnormalities, including severe left atrial enlargement, mild dilation of ascending aorta 38 mm, mildly elevated PASP 39 mmHg.   Patient denies any chest pain or shortness of breath at baseline.  She also denies any orthopnea, PND, leg edema symptoms.  She has no known history of atrial fibrillation, denies any palpitation symptoms.     Current Outpatient Medications:    acetaminophen (TYLENOL) 500 MG tablet, Take 1,000 mg by mouth every 8 (eight) hours as needed (pain.). (Patient not taking: Reported on 06/16/2022), Disp: , Rfl:    apixaban (ELIQUIS) 5 MG TABS tablet, Take 1 tablet by mouth twice daily., Disp: 180 tablet, Rfl: 2   Brimonidine Tartrate (LUMIFY) 0.025 % SOLN, Place 1 drop into both eyes 2 (two) times daily as needed  (dry/irritated eyes.)., Disp: , Rfl:    carboxymethylcellulose (REFRESH PLUS) 0.5 % SOLN, Place 1 drop into both eyes 3 (three) times daily as needed (dry/irritated eyes.)., Disp: , Rfl:    cetirizine (ZYRTEC) 10 MG tablet, Take 10 mg by mouth daily as needed (seasonal allergies.)., Disp: , Rfl:    COLLAGEN PO, Take 2 Scoops by mouth daily. Mix with coffee (Collagen Peptides), Disp: , Rfl:    fluorouracil (EFUDEX) 5 % cream, Apply 1 Application topically 2 (two) times daily as needed (skin lesions)., Disp: , Rfl:    FOSAMAX 70 MG tablet, Take 70 mg by mouth every Sunday. (Patient not taking: Reported on 06/16/2022), Disp: , Rfl:    hyoscyamine (LEVSIN SL) 0.125 MG SL tablet, Place 1 tablet (0.125 mg total) under the tongue every 4 (four) hours as needed for cramping (gas and bloating)., Disp: 60 tablet, Rfl: 1   Magnesium 250 MG TABS, Take 250 mg by mouth daily., Disp: , Rfl:    melatonin 5 MG TABS, Take 5 mg by mouth at bedtime., Disp: , Rfl:    metoprolol succinate (TOPROL-XL) 25 MG 24 hr tablet, Take 25 mg by mouth at bedtime., Disp: , Rfl:    METRONIDAZOLE, TOPICAL, 0.75 % LOTN, Apply 1 Application topically daily as needed (rosacea)., Disp: , Rfl:    rosuvastatin (CRESTOR) 10 MG tablet, Take 10 mg by mouth at bedtime., Disp: , Rfl:    tretinoin (RETIN-A) 0.05 % cream, Apply 1 application  topically daily as needed (rosacea.)., Disp: , Rfl:  Cardiovascular and other pertinent studies:  Reviewed external labs and tests, independently interpreted  EKG 07/15/2022: Sinus rhythm 56 bpm Nonspecific T-abnormality Low voltage  Echocardiogram 06/18/2022: Normal LV systolic function with visual EF 55-60%. Left ventricle cavity is normal in size. Normal left ventricular wall thickness. Normal global wall motion. Doppler evidence of grade II (pseudonormal) diastolic dysfunction, elevated LAP. Calculated EF 57%. Left atrial cavity is severely dilated at 65 ml/m^2. Right atrial cavity is mild to  moderately dilated. Structurally normal mitral valve. Moderate (Grade II) mitral regurgitation. Structurally normal tricuspid valve. Moderate tricuspid regurgitation. Mild pulmonary hypertension. RVSP measures 42 mmHg. Structurally normal pulmonic valve. Moderate pulmonic regurgitation. Compared to 11/2021, there is now grade II DD with elevated LAP.   Echocardiogram 03/05/2022: 1. Left ventricular ejection fraction, by estimation, is 55 to 60%. The  left ventricle has normal function. The left ventricle has no regional  wall motion abnormalities. Left ventricular diastolic parameters are  indeterminate.   2. Right ventricular systolic function is normal. The right ventricular  size is upper limit normal. There is moderately elevated pulmonary artery  systolic pressure.   3. Left atrial size was moderately dilated.   4. Right atrial size was moderately dilated.   5. The mitral valve is normal in structure. Mild mitral valve  regurgitation. No evidence of mitral stenosis.   6. Tricuspid valve regurgitation is moderate.   7. The aortic valve is normal in structure. Aortic valve regurgitation is  not visualized. No aortic stenosis is present.   8. The inferior vena cava is dilated in size with >50% respiratory  variability, suggesting right atrial pressure of 8 mmHg.   Comparison(s): Compared to outpatient study in 11/2021, estimated PASP  increased from 30 mmHg.    CTA chest 03/04/2022: Large linear filling defect seen in distal left pulmonary artery extending into upper lobe branches consistent with acute pulmonary embolus. Smaller filling defect seen in peripheral portion of upper lobe branch of right pulmonary artery. Positive for acute PE with CTevidence of right heart strain (RV/LV Ratio = 1.9) consistent with at least submassive (intermediate risk) PE. The presence of right heart strain has been associated with an increased risk of morbidity and mortality.   Large amount of  fluid and gas seen in distal esophageal region consistent with recent hiatal hernia surgery. Gas is also noted in the anterior mediastinum and subcutaneous tissues of the anterior chest wall also most likely due to recent surgery.   Mild bilateral posterior basilar subsegmental atelectasis.   Aortic Atherosclerosis Mobile cardiac telemetry 12 days 04/08/2021 - 04/21/2021: Dominant rhythm: Sinus. HR 50-122 bpm. Avg HR 74 bpm, in sinus rhythm. 64 episodes of probable atrial tachycardia, fastest at 176 bpm for 18 beats, longest for 9.6 secs at 138 bpm. <1% isolated SVE, couplet/triplets. 0 episodes of VT. <1% isolated VE, couplet, no triplets. No atrial fibrillation/atrial flutter/VT/high grade AV block, sinus pause >3sec noted. 0 patient triggered events.     Recent labs: 06/16/2022: Glucose 98, BUN/Cr 15/0.72. EGFR 79. Na/K 139/4.1. Rest of the CMP normal H/H 13/39. MCV 92. Platelets 292 TSH 0.6 normal  10/30/2020: Glucose 104, BUN/Cr 17/0.78. EGFR 78. Na/K 14/4.5. Rest of the CMP normal H/H 14/40. MCV 93. Platelets 240 HbA1C NA Chol 155, TG 142, HDL 61, LDL 70 TSH 1.4 normal    Review of Systems  Constitutional: Positive for malaise/fatigue.  Cardiovascular:  Negative for chest pain, dyspnea on exertion, leg swelling, palpitations and syncope.         Vitals:  07/15/22 1028  BP: 119/67  Pulse: 76  SpO2: 96%     Body mass index is 29.35 kg/m. Filed Weights   07/15/22 1028  Weight: 171 lb (77.6 kg)     Objective:   Physical Exam Vitals and nursing note reviewed.  Constitutional:      General: She is not in acute distress. Neck:     Vascular: No JVD.  Cardiovascular:     Rate and Rhythm: Normal rate and regular rhythm.     Heart sounds: Normal heart sounds. No murmur heard. Pulmonary:     Effort: Pulmonary effort is normal.     Breath sounds: Normal breath sounds. No wheezing or rales.  Musculoskeletal:     Right lower leg: No edema.     Left lower  leg: No edema.         Visit diagnoses: No diagnosis found.    No orders of the defined types were placed in this encounter.     Assessment & Recommendations:    77 year old Caucasian female with  hyperlipidemia, chronic cough, hiatal hernia, h/o perioperative DVT/PE (03/2022).  Biatrial enlargement: Constellation of her echocardiographic findings do raise suspicion of PAF. Given that her LV geometry and appearance is normal, other causes of biatrial enlargement (such as amyloidosis) are less likely.  She has had two separate outpatient cardiac telemetry without noted Afib. Regardless, patient is now on Eliquis 5 mg bid since her seemingly provoked perioperative DVT/PE in 03/2022. Certainly continue anticoagulation for another 3 months. I will then again readdress the question of anticoagulation. Given the very high suspicion for paroxysmal Afib, it may be prudent to continue anticoagulation longer.   F/u in 3 months   Elder Negus, MD Pager: [phone] Office: [phone]

## 2022-07-20 ENCOUNTER — Telehealth: Payer: Self-pay

## 2022-07-21 ENCOUNTER — Other Ambulatory Visit: Payer: Self-pay

## 2022-07-21 ENCOUNTER — Ambulatory Visit: Payer: Medicare Other | Attending: Internal Medicine | Admitting: Physical Therapy

## 2022-07-21 DIAGNOSIS — R279 Unspecified lack of coordination: Secondary | ICD-10-CM | POA: Diagnosis not present

## 2022-07-21 DIAGNOSIS — M6281 Muscle weakness (generalized): Secondary | ICD-10-CM | POA: Diagnosis not present

## 2022-07-21 DIAGNOSIS — R159 Full incontinence of feces: Secondary | ICD-10-CM | POA: Insufficient documentation

## 2022-07-21 DIAGNOSIS — R293 Abnormal posture: Secondary | ICD-10-CM | POA: Diagnosis not present

## 2022-07-21 NOTE — Therapy (Signed)
OUTPATIENT PHYSICAL THERAPY FEMALE PELVIC EVALUATION   Patient Name: [name] MRN: [id number] DOB:[birth year], 77 y.o., female Today's Date: 07/21/2022  END OF SESSION:  PT End of Session - 07/21/22 1613     Visit Number 1    Date for PT Re-Evaluation 10/13/22    PT Start Time 1613    PT Stop Time 1650    PT Time Calculation (min) 37 min    Activity Tolerance Patient tolerated treatment well    Behavior During Therapy WFL for tasks assessed/performed             Past Medical History:  Diagnosis Date   Anemia    Ascending aorta dilatation (HCC)    Atrial dilatation    Bartholin cyst    Compression fracture    lumbar    Constipation    Disorder of bone density and structure, unspecified    Fatty liver    Hearing loss    History of colon polyps    History of hiatal hernia    History of SCC (squamous cell carcinoma) of skin    Hypertension    Liver lesion    Mild cardiomegaly    Mitral regurgitation    Multinodular goiter    Obesity    Other iron deficiency anemia    Primary osteoarthritis    Pulmonary hypertension (HCC)    Pure hypercholesterolemia    Scoliosis    Sensory - neural hearing loss    right   Tremor    Tricuspid regurgitation    Vitamin D deficiency    Past Surgical History:  Procedure Laterality Date   APPENDECTOMY     BONE ANCHORED HEARING AID IMPLANT Right 10/10/2014   Procedure: BONE ANCHORED HEARING AID (BAHA) IMPLANT TEMPORAL BONE RIGHT EAR;  Surgeon: Ermalinda Barrios, MD;  Location: [facility];  Service: ENT;  Laterality: Right;   BREAST REDUCTION SURGERY     CARPAL TUNNEL RELEASE Right    CATARACT EXTRACTION Bilateral    ROTATOR CUFF REPAIR Right    STAPEDECTOMY Bilateral    has metal implants (1 in each ear) - pt cannot have a MRI of her head/brain   UMBILICAL HERNIA REPAIR     XI ROBOTIC ASSISTED HIATAL HERNIA REPAIR N/A 03/03/2022   Procedure: XI ROBOTIC ASSISTED HIATAL HERNIA REPAIR WITH MESH AND FUNDOPLICATION;   Surgeon: Axel Filler, MD;  Location: [facility] OR;  Service: General;  Laterality: N/A;   Patient Active Problem List   Diagnosis Date Noted   Gas bloat syndrome 06/16/2022   Full incontinence of feces 06/16/2022   Decreased anal sphincter tone 06/16/2022   DVT (deep venous thrombosis) (HCC) 03/05/2022   Acute pulmonary embolism (HCC) 03/05/2022   S/P Nissen fundoplication (without gastrostomy tube) procedure 03/03/2022   Nonrheumatic mitral valve regurgitation 11/12/2021   Nonrheumatic tricuspid valve regurgitation 11/12/2021   Palpitations 11/12/2021   Atrial dilatation 05/20/2021   Constipation 05/20/2021   Hearing loss 05/20/2021   Hiatal hernia 05/20/2021   Iron deficiency anemia 05/20/2021   Lesion of liver 05/20/2021   Obesity 05/20/2021   Osteochondropathy 05/20/2021   Personal history of colonic polyps 05/20/2021   Primary osteoarthritis 05/20/2021   Pulmonary hypertension (HCC) 05/20/2021   Pure hypercholesterolemia 05/20/2021   Vitamin D deficiency 05/20/2021   Laryngopharyngeal reflux (LPR) 05/09/2021   Chronic cough 05/08/2021   Family history of thyroid cancer 05/08/2021   Ascending aorta dilatation (HCC) 04/09/2021   Abnormal echocardiogram 04/09/2021   Biatrial enlargement 04/09/2021   Mild  tricuspid regurgitation 04/09/2021    PCP: Soundra Pilon, FNP  REFERRING PROVIDER: Iva Boop, MD   REFERRING DIAG: R15.9 (ICD-10-CM) - Full incontinence of feces   THERAPY DIAG:  Muscle weakness (generalized)  Unspecified lack of coordination  Abnormal posture  Rationale for Evaluation and Treatment: Rehabilitation  ONSET DATE: January 30,2024  SUBJECTIVE:                                                                                                                                                                                           SUBJECTIVE STATEMENT: Pt states she has never had a problem before the surgery.  Pt had several explosive diarrhea  episodes and this happens 1/week.  Pt is also having a LOT of gas and it can last all night.  Pt had bladder leakage a while ago and that resolved itself but now has this issue is what is going on. Intermittently have small and soft stool.  Every time I eat something I feel a lot of gurgling and hear the noises. Fluid intake: Yes: about 60 oz water/green tea    PAIN:  Are you having pain? No   PRECAUTIONS: None  WEIGHT BEARING RESTRICTIONS: No  FALLS:  Has patient fallen in last 6 months? No  LIVING ENVIRONMENT: Lives with: lives with their spouse Lives in: House/apartment   OCCUPATION:   PLOF: Independent  PATIENT GOALS: get bowel movements under control- get back to 1-2/day and not straining  PERTINENT HISTORY:  Hiatal hernia repair 02/2022; 2 vaginal deliveries (10lb baby), umbilical hernia repair Sexual abuse: No  BOWEL MOVEMENT: Pain with bowel movement: Yes have blood at times Type of bowel movement:Type (Bristol Stool Scale) water to constipation, Frequency daily unless taking imodium, and Strain No Fully empty rectum: Yes: sometimes not Leakage: Yes: with gas can be pasty Pads: Yes: at least 5 Fiber supplement: Yes: benefiber  URINATION: Pain with urination: No Fully empty bladder: Yes:   Stream:  Urgency: No Frequency: normal Leakage:  no Pads: No  INTERCOURSE:   PREGNANCY: Vaginal deliveries 2 Tearing Yes: 10 lb baby   PROLAPSE: Nothing of note   OBJECTIVE:   DIAGNOSTIC FINDINGS:  Colonoscopy 1 year ago (saw the hernia initially)   PATIENT SURVEYS:    PFIQ-7   COGNITION: Overall cognitive status: Within functional limits for tasks assessed     SENSATION: Light touch:  Proprioception: Appears intact  MUSCLE LENGTH: Hamstrings: Right 80 deg; Left 90 deg Thomas test:  LUMBAR SPECIAL TESTS:  Straight leg raise test: Negative  FUNCTIONAL TESTS:  Single leg stand normal some instability uses UE initially  GAIT:  Comments:  WFL    POSTURE: rounded shoulders, forward head, increased lumbar lordosis, and decreased thoracic kyphosis  PELVIC ALIGNMENT: upslip on left side  LUMBARAROM/PROM:  A/PROM A/PROM  eval  Flexion 75%  Extension   Right lateral flexion WFL   Left lateral flexion WFL   Right rotation 75%  Left rotation 75%   (Blank rows = not tested)  LOWER EXTREMITY ROM:  Passive ROM Right eval Left eval  Hip flexion [facility]   Hip extension    Hip abduction    Hip adduction    Hip internal rotation [facility]  WFL   Hip external rotation 75% WFL   Knee flexion    Knee extension    Ankle dorsiflexion    Ankle plantarflexion    Ankle inversion    Ankle eversion     (Blank rows = not tested)  LOWER EXTREMITY MMT:  MMT Right eval Left eval  Hip flexion    Hip extension    Hip abduction    Hip adduction    Hip internal rotation    Hip external rotation    Knee flexion    Knee extension    Ankle dorsiflexion    Ankle plantarflexion    Ankle inversion    Ankle eversion     PALPATION:   General  scar tissue well healed across left upper quadrant                External Perineal Exam anal wink present on Rt side                             Internal Pelvic Floor minimal excursion or drawing in, did better when cued to try not to pee, unable to coordinate posterior pelvic floor and bulges and bearing down with every other attempt to tighten  Patient confirms identification and approves PT to assess internal pelvic floor and treatment Yes  PELVIC MMT:   MMT eval  Vaginal   Internal Anal Sphincter 1/5  External Anal Sphincter 1/5  Puborectalis 1/5  Diastasis Recti   (Blank rows = not tested)        TONE: normal  PROLAPSE: no  TODAY'S TREATMENT:                                                                                                                              DATE: 07/21/22  EVAL and initial HEP as seen below with cues verbal and TC to get transversus abdominus  activation   PATIENT EDUCATION:  Education details: Access Code: [email or web address] Person educated: Patient Education method: Explanation, Demonstration, Tactile cues, Verbal cues, and Handouts Education comprehension: verbalized understanding and returned demonstration  HOME EXERCISE PROGRAM: Access Code: [email or web address] URL: [email or web address] Date: 07/21/2022 Prepared by: Dwana Curd  Exercises - Supine Diaphragmatic Breathing  - 3 x daily - 7 x weekly - 1 sets - 10 reps - Hooklying Transversus Abdominis  Palpation  - 1 x daily - 7 x weekly - 3 sets - 10 reps - Seated Thoracic Flexion and Rotation with Arms Crossed  - 1 x daily - 7 x weekly - 1 sets - 10 reps - 5 sec hold - Seated Thoracic Lumbar Extension  - 1 x daily - 7 x weekly - 1 sets - 10 reps - 5 sec hold  ASSESSMENT:  CLINICAL IMPRESSION: Patient is a 77 y.o. female who was seen today for physical therapy evaluation and treatment for fecal incontinence. Pt has uncoordinated pelvic floor and bulges with attempt to tighten.  Pt has posture abnormalities as mentioned and weakness in pelvic floor. Pt will benefit from skilled PT to address all above impairments in order to return to functional activities and exercise as part of healthy lifestyle.  OBJECTIVE IMPAIRMENTS: decreased coordination, decreased endurance, decreased ROM, decreased strength, increased muscle spasms, impaired flexibility, and postural dysfunction.   ACTIVITY LIMITATIONS: continence, toileting, and exericse  PARTICIPATION LIMITATIONS: interpersonal relationship and community activity  PERSONAL FACTORS: 3+ comorbidities: Hiatal hernia repair 02/2022; 2 vaginal deliveries (10lb baby), umbilical hernia repair  are also affecting patient's functional outcome.   REHAB POTENTIAL: Excellent  CLINICAL DECISION MAKING: Evolving/moderate complexity  EVALUATION COMPLEXITY: Moderate   GOALS: Goals reviewed with patient? Yes  SHORT TERM GOALS:  Target date: 08/18/22  Ind with initial HEP Baseline: Goal status: INITIAL   LONG TERM GOALS: Target date: 10/13/22  Pt will be independent with advanced HEP to maintain improvements made throughout therapy  Baseline:  Goal status: INITIAL  2.  Pt will report her BMs are complete due to improved bowel habits and evacuation techniques.  Baseline:  Goal status: INITIAL  3.  Pt will be able to functional actions and not have leakage for 1 week Baseline:  Goal status: INITIAL  4.  Pt will notice 50% less gas buildup due to more complete bowel movements. Baseline:  Goal status: INITIAL   PLAN:  PT FREQUENCY: 1-2x/week  PT DURATION: 12 weeks  PLANNED INTERVENTIONS: Therapeutic exercises, Therapeutic activity, Neuromuscular re-education, Balance training, Gait training, Patient/Family education, Self Care, Joint mobilization, Dry Needling, Electrical stimulation, Cryotherapy, Moist heat, Taping, Biofeedback, Manual therapy, and Re-evaluation  PLAN FOR NEXT SESSION: RUSI looking at lift verses bulging pelvic floor, transversus abdominus activation, improved thoracic mobility   Brayton Caves Pelagia Iacobucci, PT 07/21/2022, 4:17 PM

## 2022-07-22 ENCOUNTER — Telehealth (HOSPITAL_BASED_OUTPATIENT_CLINIC_OR_DEPARTMENT_OTHER): Payer: Self-pay | Admitting: Obstetrics & Gynecology

## 2022-07-22 ENCOUNTER — Telehealth: Payer: Self-pay | Admitting: Internal Medicine

## 2022-07-22 NOTE — Telephone Encounter (Signed)
Patient came by today and state she having pelvic  problems and needs to be seen .

## 2022-07-22 NOTE — Telephone Encounter (Signed)
Pt scheduled to see Dr. Leone Payor 07/30/22 at 1:30pm. Pt aware of appt.

## 2022-07-22 NOTE — Telephone Encounter (Signed)
Inbound call from patient stating she was speaking with Viviann Spare regarding a 6/27 appointment with Dr. Leone Payor. Requesting a call back to discuss if that appointment is still available. Please advise, thank you.

## 2022-07-22 NOTE — Telephone Encounter (Signed)
Pt reports that she had major hernia surgery in January which consumed about 75% of her stomach. She has been to many different providers since then to try and determine what is causing her ongoing GI issues. She has also seen a pelvic floor provider to work on pelvic floor muscles. She is requesting an appt for pelvic exam to rule out any potential problems. She is aware that medicare may not pay and is willing to pay out of pocket. Pt provided with appt.

## 2022-07-24 ENCOUNTER — Ambulatory Visit (INDEPENDENT_AMBULATORY_CARE_PROVIDER_SITE_OTHER): Payer: Medicare Other | Admitting: Obstetrics & Gynecology

## 2022-07-24 ENCOUNTER — Encounter (HOSPITAL_BASED_OUTPATIENT_CLINIC_OR_DEPARTMENT_OTHER): Payer: Self-pay | Admitting: Obstetrics & Gynecology

## 2022-07-24 VITALS — BP 130/74 | HR 60 | Ht 64.0 in | Wt 167.6 lb

## 2022-07-24 DIAGNOSIS — R197 Diarrhea, unspecified: Secondary | ICD-10-CM

## 2022-07-24 DIAGNOSIS — R300 Dysuria: Secondary | ICD-10-CM

## 2022-07-24 DIAGNOSIS — R3129 Other microscopic hematuria: Secondary | ICD-10-CM

## 2022-07-24 DIAGNOSIS — R140 Abdominal distension (gaseous): Secondary | ICD-10-CM

## 2022-07-24 DIAGNOSIS — R1084 Generalized abdominal pain: Secondary | ICD-10-CM | POA: Diagnosis not present

## 2022-07-24 LAB — POCT URINALYSIS DIPSTICK
Bilirubin, UA: NEGATIVE
Glucose, UA: NEGATIVE
Ketones, UA: NEGATIVE
Nitrite, UA: NEGATIVE
Protein, UA: POSITIVE — AB
Spec Grav, UA: 1.02 (ref 1.010–1.025)
Urobilinogen, UA: 0.2 E.U./dL
pH, UA: 5.5 (ref 5.0–8.0)

## 2022-07-24 MED ORDER — SULFAMETHOXAZOLE-TRIMETHOPRIM 800-160 MG PO TABS: 1.0000 | ORAL_TABLET | Freq: Two times a day (BID) | ORAL | 0 refills | Status: DC

## 2022-07-24 NOTE — Progress Notes (Signed)
GYNECOLOGY  VISIT  CC:   bloating, increased gas, diarrhea  HPI: 77 y.o. G2P2000 Married White or Caucasian female here for complaint of increased bloating, gas and diarrhea that has been present since laparoscopic surgery done for hiatal hernia repair.  She reports bowel function will sometimes be almost explosive until she is feels completely empty.  .She will feel washed out and weak when these episodes happen.  Gastric empty study was delayed at hours 3 and 4.  Abd xray showed mild diffuse distention without overt obstruction.  Possible ileus present.  She did have a perioperative PE and is on eliquis.  She will be on this for six months.    Stool culture and c diff testing negative.  CBC with normal WBC ct.  Calprotectin, fecal, was normal.  She did see Dr. Leone Payor, GI, on 5/14.  She was started on Fodzyme 2 with meals twice daily.  Operative note, Dr. Derrell Lolling note, and Dr. Leone Payor note results.  Multiple labs reviewed as well.  She has started pelvic PT.  Has follow up tomorrow.    Denies vaginal bleeding.  Is concerned she may have a UTI with some dysuria.  Urine is cloudy.     Past Medical History:  Diagnosis Date   Anemia    Ascending aorta dilatation (HCC)    Atrial dilatation    Bartholin cyst    Compression fracture    lumbar    Constipation    Disorder of bone density and structure, unspecified    Fatty liver    Hearing loss    History of colon polyps    History of hiatal hernia    History of SCC (squamous cell carcinoma) of skin    Hypertension    Liver lesion    Mild cardiomegaly    Mitral regurgitation    Multinodular goiter    Obesity    Other iron deficiency anemia    Primary osteoarthritis    Pulmonary hypertension (HCC)    Pure hypercholesterolemia    Scoliosis    Sensory - neural hearing loss    right   Tremor    Tricuspid regurgitation    Vitamin D deficiency     MEDS:   Current Outpatient Medications on File Prior to Visit  Medication Sig  Dispense Refill   acetaminophen (TYLENOL) 500 MG tablet Take 1,000 mg by mouth every 8 (eight) hours as needed (pain.).     apixaban (ELIQUIS) 5 MG TABS tablet Take 1 tablet by mouth twice daily. 180 tablet 2   Brimonidine Tartrate (LUMIFY) 0.025 % SOLN Place 1 drop into both eyes 2 (two) times daily as needed (dry/irritated eyes.).     carboxymethylcellulose (REFRESH PLUS) 0.5 % SOLN Place 1 drop into both eyes 3 (three) times daily as needed (dry/irritated eyes.).     cetirizine (ZYRTEC) 10 MG tablet Take 10 mg by mouth daily as needed (seasonal allergies.).     COLLAGEN PO Take 2 Scoops by mouth daily. Mix with coffee (Collagen Peptides)     fluorouracil (EFUDEX) 5 % cream Apply 1 Application topically 2 (two) times daily as needed (skin lesions).     hyoscyamine (LEVSIN SL) 0.125 MG SL tablet Place 1 tablet (0.125 mg total) under the tongue every 4 (four) hours as needed for cramping (gas and bloating). 60 tablet 1   Magnesium 250 MG TABS Take 250 mg by mouth daily.     melatonin 5 MG TABS Take 5 mg by mouth at bedtime.  metoprolol succinate (TOPROL-XL) 25 MG 24 hr tablet Take 25 mg by mouth at bedtime.     METRONIDAZOLE, TOPICAL, 0.75 % LOTN Apply 1 Application topically daily as needed (rosacea).     rosuvastatin (CRESTOR) 10 MG tablet Take 10 mg by mouth at bedtime.     tretinoin (RETIN-A) 0.05 % cream Apply 1 application  topically daily as needed (rosacea.).     No current facility-administered medications on file prior to visit.    ALLERGIES: Patient has no known allergies.  SH:  married, non smoker  Review of Systems  Constitutional: Negative.   Gastrointestinal:  Positive for abdominal pain and diarrhea.  Genitourinary:  Positive for dysuria and urgency.    PHYSICAL EXAMINATION:    BP 130/74 (BP Location: Left Arm, Patient Position: Sitting, Cuff Size: Large)   Pulse 60   Ht 5\' 4"  (1.626 m) Comment: Reported  Wt 167 lb 9.6 oz (76 kg)   BMI 28.77 kg/m     General  appearance: alert, cooperative and appears stated age CV:  Regular rate and rhythm Lungs:  clear to auscultation, no wheezes, rales or rhonchi, symmetric air entry Abdomen: soft, non-tender; bowel sounds normal; no masses,  no organomegaly Lymph:  no inguinal LAD noted  Pelvic: External genitalia:  no lesions              Urethra:  normal appearing urethra with no masses, tenderness or lesions              Bartholins and Skenes: normal                 Vagina: normal appearing vagina with normal color and discharge, no lesions              Cervix: no lesions              Bimanual Exam:  Uterus:  normal size, contour, position, consistency, mobility, non-tender              Adnexa: no mass, fullness, tenderness              Rectovaginal: Yes.  .  Confirms.              Anus:  normal sphincter tone, no lesions but hemorrhoids present  Chaperone, Raechel Ache, RN, was present for exam.  Assessment/Plan: 1. Generalized abdominal pain - will proceed with CT scan to r/o pelvic and abdominal pathology - CT ABDOMEN PELVIS W CONTRAST; Future  2. Dysuria - sulfamethoxazole-trimethoprim (BACTRIM DS) 800-160 MG tablet; Take 1 tablet by mouth 2 (two) times daily.  Dispense: 6 tablet; Refill: 0 - Urine Culture  3. Bloating  4. Diarrhea, unspecified type

## 2022-07-24 NOTE — Addendum Note (Signed)
Addended by: Ina Homes B on: 07/24/2022 11:32 AM   Modules accepted: Orders

## 2022-07-28 ENCOUNTER — Ambulatory Visit (HOSPITAL_BASED_OUTPATIENT_CLINIC_OR_DEPARTMENT_OTHER)
Admission: RE | Admit: 2022-07-28 | Discharge: 2022-07-28 | Disposition: A | Discharge status: Home / Self Care | Payer: Medicare Other | Source: Ambulatory Visit | Attending: Obstetrics & Gynecology | Admitting: Obstetrics & Gynecology

## 2022-07-28 ENCOUNTER — Other Ambulatory Visit: Payer: Self-pay | Admitting: Internal Medicine

## 2022-07-28 DIAGNOSIS — R197 Diarrhea, unspecified: Secondary | ICD-10-CM | POA: Insufficient documentation

## 2022-07-28 DIAGNOSIS — R1084 Generalized abdominal pain: Secondary | ICD-10-CM | POA: Insufficient documentation

## 2022-07-28 DIAGNOSIS — R140 Abdominal distension (gaseous): Secondary | ICD-10-CM | POA: Diagnosis not present

## 2022-07-28 DIAGNOSIS — R932 Abnormal findings on diagnostic imaging of liver and biliary tract: Secondary | ICD-10-CM | POA: Diagnosis not present

## 2022-07-28 DIAGNOSIS — K449 Diaphragmatic hernia without obstruction or gangrene: Secondary | ICD-10-CM | POA: Diagnosis not present

## 2022-07-28 DIAGNOSIS — K8689 Other specified diseases of pancreas: Secondary | ICD-10-CM | POA: Diagnosis not present

## 2022-07-28 DIAGNOSIS — K5730 Diverticulosis of large intestine without perforation or abscess without bleeding: Secondary | ICD-10-CM | POA: Diagnosis not present

## 2022-07-28 MED ORDER — IOHEXOL 300 MG/ML  SOLN
100.0000 mL | Freq: Once | INTRAMUSCULAR | Status: AC | PRN
  Administered 2022-07-28: 75 mL via INTRAVENOUS

## 2022-07-29 LAB — URINE CULTURE

## 2022-07-29 NOTE — Therapy (Unsigned)
OUTPATIENT PHYSICAL THERAPY FEMALE PELVIC Treatment   Patient Name: [name] MRN: [id number] DOB:May 08, 1945, 77 y.o., female Today's Date: 07/30/2022  END OF SESSION:  PT End of Session - 07/30/22 1452     Visit Number 2    Date for PT Re-Evaluation 10/13/22    Authorization Type mcare    PT Start Time 1150    PT Stop Time 1230    PT Time Calculation (min) 40 min    Activity Tolerance Patient tolerated treatment well    Behavior During Therapy WFL for tasks assessed/performed              Past Medical History:  Diagnosis Date   Anemia    Ascending aorta dilatation (HCC)    Atrial dilatation    Bartholin cyst    Compression fracture    lumbar    Constipation    Disorder of bone density and structure, unspecified    Fatty liver    Hearing loss    History of colon polyps    History of hiatal hernia    History of SCC (squamous cell carcinoma) of skin    Hypertension    Liver lesion    Mild cardiomegaly    Mitral regurgitation    Multinodular goiter    Obesity    Other iron deficiency anemia    Primary osteoarthritis    Pulmonary hypertension (HCC)    Pure hypercholesterolemia    Scoliosis    Sensory - neural hearing loss    right   Tremor    Tricuspid regurgitation    Vitamin D deficiency    Past Surgical History:  Procedure Laterality Date   APPENDECTOMY     BONE ANCHORED HEARING AID IMPLANT Right 10/10/2014   Procedure: BONE ANCHORED HEARING AID (BAHA) IMPLANT TEMPORAL BONE RIGHT EAR;  Surgeon: Ermalinda Barrios, MD;  Location: [facility];  Service: ENT;  Laterality: Right;   BREAST REDUCTION SURGERY     CARPAL TUNNEL RELEASE Right    CATARACT EXTRACTION Bilateral    ROTATOR CUFF REPAIR Right    STAPEDECTOMY Bilateral    has metal implants (1 in each ear) - pt cannot have a MRI of her head/brain   UMBILICAL HERNIA REPAIR     XI ROBOTIC ASSISTED HIATAL HERNIA REPAIR N/A 03/03/2022   Procedure: XI ROBOTIC ASSISTED HIATAL HERNIA REPAIR  WITH MESH AND FUNDOPLICATION;  Surgeon: Axel Filler, MD;  Location: [facility];  Service: General;  Laterality: N/A;   Patient Active Problem List   Diagnosis Date Noted   Gas bloat syndrome 06/16/2022   Full incontinence of feces 06/16/2022   Decreased anal sphincter tone 06/16/2022   DVT (deep venous thrombosis) (HCC) 03/05/2022   Acute pulmonary embolism (HCC) 03/05/2022   S/P Nissen fundoplication (without gastrostomy tube) procedure 03/03/2022   Nonrheumatic mitral valve regurgitation 11/12/2021   Nonrheumatic tricuspid valve regurgitation 11/12/2021   Palpitations 11/12/2021   Atrial dilatation 05/20/2021   Constipation 05/20/2021   Hearing loss 05/20/2021   Hiatal hernia 05/20/2021   Iron deficiency anemia 05/20/2021   Lesion of liver 05/20/2021   Obesity 05/20/2021   Osteochondropathy 05/20/2021   Personal history of colonic polyps 05/20/2021   Primary osteoarthritis 05/20/2021   Pulmonary hypertension (HCC) 05/20/2021   Pure hypercholesterolemia 05/20/2021   Vitamin D deficiency 05/20/2021   Laryngopharyngeal reflux (LPR) 05/09/2021   Chronic cough 05/08/2021   Family history of thyroid cancer 05/08/2021   Ascending aorta dilatation (HCC) 04/09/2021   Abnormal echocardiogram 04/09/2021  Biatrial enlargement 04/09/2021   Mild tricuspid regurgitation 04/09/2021    PCP: Soundra Pilon, FNP  REFERRING PROVIDER: Iva Boop, MD   REFERRING DIAG: R15.9 (ICD-10-CM) - Full incontinence of feces   THERAPY DIAG:  Muscle weakness (generalized)  Unspecified lack of coordination  Abnormal posture  Rationale for Evaluation and Treatment: Rehabilitation  ONSET DATE: January 30,2024  SUBJECTIVE:                                                                                                                                                                                           SUBJECTIVE STATEMENT: Pt states she got a CT scan of abdomen Fluid intake: Yes: about  60 oz water/green tea    PAIN:  Are you having pain? No   PRECAUTIONS: None  WEIGHT BEARING RESTRICTIONS: No  FALLS:  Has patient fallen in last 6 months? No  LIVING ENVIRONMENT: Lives with: lives with their spouse Lives in: House/apartment   OCCUPATION:   PLOF: Independent  PATIENT GOALS: get bowel movements under control- get back to 1-2/day and not straining  PERTINENT HISTORY:  Hiatal hernia repair 02/2022; 2 vaginal deliveries (10lb baby), umbilical hernia repair Sexual abuse: No  BOWEL MOVEMENT: Pain with bowel movement: Yes have blood at times Type of bowel movement:Type (Bristol Stool Scale) water to constipation, Frequency daily unless taking imodium, and Strain No Fully empty rectum: Yes: sometimes not Leakage: Yes: with gas can be pasty Pads: Yes: at least 5 Fiber supplement: Yes: benefiber  URINATION: Pain with urination: No Fully empty bladder: Yes:   Stream:  Urgency: No Frequency: normal Leakage:  no Pads: No  INTERCOURSE:   PREGNANCY: Vaginal deliveries 2 Tearing Yes: 10 lb baby   PROLAPSE: Nothing of note   OBJECTIVE:   DIAGNOSTIC FINDINGS:  Colonoscopy 1 year ago (saw the hernia initially)   PATIENT SURVEYS:    PFIQ-7   COGNITION: Overall cognitive status: Within functional limits for tasks assessed     SENSATION: Light touch:  Proprioception: Appears intact  MUSCLE LENGTH: Hamstrings: Right 80 deg; Left 90 deg Thomas test:  LUMBAR SPECIAL TESTS:  Straight leg raise test: Negative  FUNCTIONAL TESTS:  Single leg stand normal some instability uses UE initially  GAIT:  Comments: WFL   POSTURE: rounded shoulders, forward head, increased lumbar lordosis, and decreased thoracic kyphosis  PELVIC ALIGNMENT: upslip on left side  LUMBARAROM/PROM:  A/PROM A/PROM  eval  Flexion 75%  Extension   Right lateral flexion WFL   Left lateral flexion WFL   Right rotation 75%  Left rotation 75%   (Blank rows = not  tested)  LOWER EXTREMITY  ROM:  Passive ROM Right eval Left eval  Hip flexion [facility]  [facility]   Hip extension    Hip abduction    Hip adduction    Hip internal rotation [facility]  WFL   Hip external rotation 75% WFL   Knee flexion    Knee extension    Ankle dorsiflexion    Ankle plantarflexion    Ankle inversion    Ankle eversion     (Blank rows = not tested)  LOWER EXTREMITY MMT:  MMT Right eval Left eval  Hip flexion    Hip extension    Hip abduction    Hip adduction    Hip internal rotation    Hip external rotation    Knee flexion    Knee extension    Ankle dorsiflexion    Ankle plantarflexion    Ankle inversion    Ankle eversion     PALPATION:   General  scar tissue well healed across left upper quadrant                External Perineal Exam anal wink present on Rt side                             Internal Pelvic Floor minimal excursion or drawing in, did better when cued to try not to pee, unable to coordinate posterior pelvic floor and bulges and bearing down with every other attempt to tighten  Patient confirms identification and approves PT to assess internal pelvic floor and treatment Yes  PELVIC MMT:   MMT eval  Vaginal   Internal Anal Sphincter 1/5  External Anal Sphincter 1/5  Puborectalis 1/5  Diastasis Recti   (Blank rows = not tested)        TONE: normal  PROLAPSE: no  TODAY'S TREATMENT:                                                                                                                              DATE: 07/30/22  RUSI: transversus abdominus activation in supine, with ball squeeze, without ball, small march, UE lifting 2-4lb overhead Pt cued to keep back flat and not arch off mat - transversus abdominus with exhale   PATIENT EDUCATION:  Education details: Access Code: [email or web address] Person educated: Patient Education method: Explanation, Demonstration, Tactile cues, Verbal cues, and Handouts Education comprehension: verbalized  understanding and returned demonstration  HOME EXERCISE PROGRAM: Access Code: [email or web address] URL: [email or web address] Date: 07/21/2022 Prepared by: Dwana Curd  Exercises - Supine Diaphragmatic Breathing  - 3 x daily - 7 x weekly - 1 sets - 10 reps - Hooklying Transversus Abdominis Palpation  - 1 x daily - 7 x weekly - 3 sets - 10 reps - Seated Thoracic Flexion and Rotation with Arms Crossed  - 1 x daily - 7 x weekly - 1 sets - 10 reps - 5 sec hold - Seated Thoracic Lumbar Extension  -  1 x daily - 7 x weekly - 1 sets - 10 reps - 5 sec hold  ASSESSMENT:  CLINICAL IMPRESSION: Patient is still having bowel movement that vary in consistency but some are normal.  Pt did well with correctly engaged transversus abdominus and able to do with exhale.  Pt has pelvic floor lift along with transversus abdominus activation.  Exercises were added to HEP to work on reinforcement of motor patterns that need to be reinforced for improved bowel movement and pelvic floor strength.  Pt will benefit from skilled PT to address all above impairments in order to return to functional activities and exercise as part of healthy lifestyle.   OBJECTIVE IMPAIRMENTS: decreased coordination, decreased endurance, decreased ROM, decreased strength, increased muscle spasms, impaired flexibility, and postural dysfunction.   ACTIVITY LIMITATIONS: continence, toileting, and exericse  PARTICIPATION LIMITATIONS: interpersonal relationship and community activity  PERSONAL FACTORS: 3+ comorbidities: Hiatal hernia repair 02/2022; 2 vaginal deliveries (10lb baby), umbilical hernia repair  are also affecting patient's functional outcome.   REHAB POTENTIAL: Excellent  CLINICAL DECISION MAKING: Evolving/moderate complexity  EVALUATION COMPLEXITY: Moderate   GOALS: Goals reviewed with patient? Yes  SHORT TERM GOALS: Target date: 08/18/22  Ind with initial HEP Baseline: Goal status: MET   LONG TERM  GOALS: Target date: 10/13/22  Pt will be independent with advanced HEP to maintain improvements made throughout therapy  Baseline:  Goal status: INITIAL  2.  Pt will report her BMs are complete due to improved bowel habits and evacuation techniques.  Baseline:  Goal status: INITIAL  3.  Pt will be able to functional actions and not have leakage for 1 week Baseline:  Goal status: INITIAL  4.  Pt will notice 50% less gas buildup due to more complete bowel movements. Baseline:  Goal status: INITIAL   PLAN:  PT FREQUENCY: 1-2x/week  PT DURATION: 12 weeks  PLANNED INTERVENTIONS: Therapeutic exercises, Therapeutic activity, Neuromuscular re-education, Balance training, Gait training, Patient/Family education, Self Care, Joint mobilization, Dry Needling, Electrical stimulation, Cryotherapy, Moist heat, Taping, Biofeedback, Manual therapy, and Re-evaluation  PLAN FOR NEXT SESSION: stretch and relax gluteals and lumbar to make sure having complete bowel movement, f/u on CT scan results, anal sphincter/posterior pelvic control (contract/relax)   Brayton Caves Taina Landry, PT 07/30/2022, 3:02 PM

## 2022-07-29 NOTE — Addendum Note (Signed)
Addended by: Jerene Bears on: 07/29/2022 02:56 PM   Modules accepted: Orders

## 2022-07-30 ENCOUNTER — Ambulatory Visit: Payer: Medicare Other | Admitting: Physical Therapy

## 2022-07-30 ENCOUNTER — Encounter: Payer: Self-pay | Admitting: Internal Medicine

## 2022-07-30 ENCOUNTER — Encounter: Payer: Self-pay | Admitting: Physical Therapy

## 2022-07-30 ENCOUNTER — Ambulatory Visit (INDEPENDENT_AMBULATORY_CARE_PROVIDER_SITE_OTHER): Payer: Medicare Other | Admitting: Internal Medicine

## 2022-07-30 VITALS — BP 120/80 | HR 66 | Ht 64.0 in | Wt 172.0 lb

## 2022-07-30 DIAGNOSIS — K6289 Other specified diseases of anus and rectum: Secondary | ICD-10-CM | POA: Diagnosis not present

## 2022-07-30 DIAGNOSIS — M6289 Other specified disorders of muscle: Secondary | ICD-10-CM | POA: Diagnosis not present

## 2022-07-30 DIAGNOSIS — K9289 Other specified diseases of the digestive system: Secondary | ICD-10-CM | POA: Diagnosis not present

## 2022-07-30 DIAGNOSIS — R293 Abnormal posture: Secondary | ICD-10-CM

## 2022-07-30 DIAGNOSIS — R279 Unspecified lack of coordination: Secondary | ICD-10-CM | POA: Diagnosis not present

## 2022-07-30 DIAGNOSIS — R194 Change in bowel habit: Secondary | ICD-10-CM

## 2022-07-30 DIAGNOSIS — R159 Full incontinence of feces: Secondary | ICD-10-CM | POA: Diagnosis not present

## 2022-07-30 DIAGNOSIS — Z9889 Other specified postprocedural states: Secondary | ICD-10-CM | POA: Diagnosis not present

## 2022-07-30 DIAGNOSIS — M6281 Muscle weakness (generalized): Secondary | ICD-10-CM

## 2022-07-30 DIAGNOSIS — K580 Irritable bowel syndrome with diarrhea: Secondary | ICD-10-CM | POA: Diagnosis not present

## 2022-07-30 NOTE — Patient Instructions (Addendum)
Glad things are improving and I hope they get much better.  Continue what you are doing.  The dietitian I recommend is Lowanda Foster - [facility] ([email or web address])  Can find on the web and connect that way. Tell her I sent you.  Let me know how that goes.  I appreciate the opportunity to care for you. Iva Boop, MD, Clement[facility] Graham

## 2022-07-30 NOTE — Progress Notes (Signed)
[name] 77 y.o. [birth year] [id number]  Assessment & Plan:   Encounter Diagnoses  Name Primary?   Gas bloat syndrome Yes   Altered bowel habits    Pelvic floor dysfunction    Decreased anal sphincter tone    S/P laparoscopic Toupet fundoplication    Irritable bowel syndrome with diarrhea    The patient is improving.  Continue Fodzyme, hyoscyamine and pelvic floor physical therapy.  Return here in October.  Avoid food triggers.  Discussed possibility of SIBO testing but she is making progress and I will hold off on that.  Follow-up CT results  They have returned: IMPRESSION: 1. No acute inflammatory process identified in the abdomen or pelvis. 2. Diverticulosis without imaging signs of diverticulitis. 3. Multiple other nonacute observations, as described above.   Overall I think she had some gas bloat issues in the setting of pelvic floor dysfunction and the combination created major issues for her.  I think if she had 1 and not the other things would have been so bad for her.   CC: Soundra Pilon, FNP   Subjective:   Chief Complaint: Follow-up of gas bloat syndrome and altered bowel habits  HPI 77 year old white woman with suspected gas bloat syndrome after toupet fundoplication, in the setting of pelvic floor dysfunction and decreased anal sphincter tone.  I had met her in Jun 16, 2022.  She has had slightly delayed gastric emptying at 3 and 4 hours on a gastric emptying study, she has had negative calprotectin, stool culture and C. difficile toxin prior to coming to see me.  She has had irregular bowel habits with constipation issues and loose diarrheal stools and fecal incontinence.  I instituted a regimen of FODZYME, hyoscyamine and referred to pelvic floor physical therapy.  I had her she kept a food and stool diary.  What stood out on that was terrible diarrhea after eating salads.  When she does have diarrhea attack she will take 1-3 Imodium but then she  will not defecate for a day or 2.  Hyoscyamine has been helpful for abdominal cramps and gas sensation or pains.  She has not been vomiting.  She saw Dr. Hyacinth Meeker recently and was complaining of some diffuse abdominal discomfort and a CT scan of the abdomen and pelvis was ordered.  I looked at those images therefore report is not back yet.  I do not see anything grossly abnormal but we will await radiology report.  She had positive leukocytes in her urinalysis and was treated with trimethoprim/sulfamethoxazole.  E. coli grew out.  She feels like that is improved.  Overall she does feel improved though is not where she wants to be.  She had her second session of pelvic PT today.  I have reviewed those notes. Wt Readings from Last 3 Encounters:  07/30/22 172 lb (78 kg)  07/24/22 167 lb 9.6 oz (76 kg)  07/15/22 171 lb (77.6 kg)   Gastric emptying study 07/08/2022 20.9% emptied at 1 hr ( normal >= 10%)   59% emptied at 2 hr ( normal >= 40%)   67.7% emptied at 3 hr ( normal >= 70%)   81.5% emptied at 4 hr ( normal >= 90%)   IMPRESSION: Gastric emptying is normal at 1 and 2 hours but delayed at 3 and 4 hours. 2 view abdominal film IMPRESSION: 1. Mild diffuse distention of small-bowel loops without overt obstruction. Question ileus 2. Mild gaseous distention.     Electronically Signed   By:  Roanna Banning M.D.   On: 07/20/2022 08:03    No Known Allergies Current Meds  Medication Sig   acetaminophen (TYLENOL) 500 MG tablet Take 1,000 mg by mouth every 8 (eight) hours as needed (pain.).   apixaban (ELIQUIS) 5 MG TABS tablet Take 1 tablet by mouth twice daily.   Brimonidine Tartrate (LUMIFY) 0.025 % SOLN Place 1 drop into both eyes 2 (two) times daily as needed (dry/irritated eyes.).   carboxymethylcellulose (REFRESH PLUS) 0.5 % SOLN Place 1 drop into both eyes 3 (three) times daily as needed (dry/irritated eyes.).   cetirizine (ZYRTEC) 10 MG tablet Take 10 mg by mouth daily as needed  (seasonal allergies.).   COLLAGEN PO Take 2 Scoops by mouth daily. Mix with coffee (Collagen Peptides)   fluorouracil (EFUDEX) 5 % cream Apply 1 Application topically 2 (two) times daily as needed (skin lesions).   Magnesium 250 MG TABS Take 250 mg by mouth daily.   melatonin 5 MG TABS Take 5 mg by mouth at bedtime.   metoprolol succinate (TOPROL-XL) 25 MG 24 hr tablet Take 25 mg by mouth at bedtime.   METRONIDAZOLE, TOPICAL, 0.75 % LOTN Apply 1 Application topically daily as needed (rosacea).   rosuvastatin (CRESTOR) 10 MG tablet Take 10 mg by mouth at bedtime.   tretinoin (RETIN-A) 0.05 % cream Apply 1 application  topically daily as needed (rosacea.).   [DISCONTINUED] hyoscyamine (LEVSIN SL) 0.125 MG SL tablet Place 1 tablet (0.125 mg total) under the tongue every 4 (four) hours as needed for cramping (gas and bloating).   Past Medical History:  Diagnosis Date   Anemia    Ascending aorta dilatation (HCC)    Atrial dilatation    Bartholin cyst    Compression fracture    lumbar    Constipation    Disorder of bone density and structure, unspecified    Fatty liver    Hearing loss    History of colon polyps    History of hiatal hernia    History of SCC (squamous cell carcinoma) of skin    Hypertension    Liver lesion    Mild cardiomegaly    Mitral regurgitation    Multinodular goiter    Obesity    Other iron deficiency anemia    Primary osteoarthritis    Pulmonary hypertension (HCC)    Pure hypercholesterolemia    Scoliosis    Sensory - neural hearing loss    right   Tremor    Tricuspid regurgitation    Vitamin D deficiency    Past Surgical History:  Procedure Laterality Date   APPENDECTOMY     BONE ANCHORED HEARING AID IMPLANT Right 10/10/2014   Procedure: BONE ANCHORED HEARING AID (BAHA) IMPLANT TEMPORAL BONE RIGHT EAR;  Surgeon: Ermalinda Barrios, MD;  Location: [facility];  Service: ENT;  Laterality: Right;   BREAST REDUCTION SURGERY     CARPAL TUNNEL  RELEASE Right    CATARACT EXTRACTION Bilateral    ROTATOR CUFF REPAIR Right    STAPEDECTOMY Bilateral    has metal implants (1 in each ear) - pt cannot have a MRI of her head/brain   UMBILICAL HERNIA REPAIR     XI ROBOTIC ASSISTED HIATAL HERNIA REPAIR N/A 03/03/2022   Procedure: XI ROBOTIC ASSISTED HIATAL HERNIA REPAIR WITH MESH AND FUNDOPLICATION;  Surgeon: Axel Filler, MD;  Location: [facility];  Service: General;  Laterality: N/A;   Social History   Social History Narrative   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   Review of Systems As above  Objective:   Physical Exam BP 120/80   Pulse 66   Ht 5\' 4"  (1.626 m)   Wt 172 lb (78 kg)   BMI 29.52 kg/m

## 2022-07-31 ENCOUNTER — Other Ambulatory Visit (HOSPITAL_BASED_OUTPATIENT_CLINIC_OR_DEPARTMENT_OTHER): Payer: Medicare Other

## 2022-07-31 ENCOUNTER — Other Ambulatory Visit: Payer: Self-pay | Admitting: Obstetrics & Gynecology

## 2022-07-31 DIAGNOSIS — R3129 Other microscopic hematuria: Secondary | ICD-10-CM | POA: Diagnosis not present

## 2022-08-03 ENCOUNTER — Ambulatory Visit: Payer: Medicare Other | Attending: Internal Medicine | Admitting: Physical Therapy

## 2022-08-03 ENCOUNTER — Encounter: Payer: Self-pay | Admitting: Physical Therapy

## 2022-08-03 DIAGNOSIS — R293 Abnormal posture: Secondary | ICD-10-CM | POA: Diagnosis not present

## 2022-08-03 DIAGNOSIS — M6281 Muscle weakness (generalized): Secondary | ICD-10-CM | POA: Diagnosis not present

## 2022-08-03 DIAGNOSIS — R279 Unspecified lack of coordination: Secondary | ICD-10-CM | POA: Diagnosis not present

## 2022-08-03 NOTE — Therapy (Signed)
OUTPATIENT PHYSICAL THERAPY FEMALE PELVIC Treatment   Patient Name: [name] MRN: [id number] DOB:October 05, 1945, 77 y.o., female Today's Date: 08/03/2022  END OF SESSION:  PT End of Session - 08/03/22 1210     Visit Number 3    Date for PT Re-Evaluation 10/13/22    Authorization Type mcare    PT Start Time 1155   arrived late   PT Stop Time 1225    PT Time Calculation (min) 30 min    Activity Tolerance Patient tolerated treatment well    Behavior During Therapy WFL for tasks assessed/performed               Past Medical History:  Diagnosis Date   Anemia    Ascending aorta dilatation (HCC)    Atrial dilatation    Bartholin cyst    Compression fracture    lumbar    Constipation    Disorder of bone density and structure, unspecified    Fatty liver    Hearing loss    History of colon polyps    History of hiatal hernia    History of SCC (squamous cell carcinoma) of skin    Hypertension    Liver lesion    Mild cardiomegaly    Mitral regurgitation    Multinodular goiter    Obesity    Other iron deficiency anemia    Primary osteoarthritis    Pulmonary hypertension (HCC)    Pure hypercholesterolemia    Scoliosis    Sensory - neural hearing loss    right   Tremor    Tricuspid regurgitation    Vitamin D deficiency    Past Surgical History:  Procedure Laterality Date   APPENDECTOMY     BONE ANCHORED HEARING AID IMPLANT Right 10/10/2014   Procedure: BONE ANCHORED HEARING AID (BAHA) IMPLANT TEMPORAL BONE RIGHT EAR;  Surgeon: Ermalinda Barrios, MD;  Location: [facility];  Service: ENT;  Laterality: Right;   BREAST REDUCTION SURGERY     CARPAL TUNNEL RELEASE Right    CATARACT EXTRACTION Bilateral    ROTATOR CUFF REPAIR Right    STAPEDECTOMY Bilateral    has metal implants (1 in each ear) - pt cannot have a MRI of her head/brain   UMBILICAL HERNIA REPAIR     XI ROBOTIC ASSISTED HIATAL HERNIA REPAIR N/A 03/03/2022   Procedure: XI ROBOTIC ASSISTED HIATAL  HERNIA REPAIR WITH MESH AND FUNDOPLICATION;  Surgeon: Axel Filler, MD;  Location: [facility] OR;  Service: General;  Laterality: N/A;   Patient Active Problem List   Diagnosis Date Noted   Irritable bowel syndrome with diarrhea 07/30/2022   Pelvic floor dysfunction 07/30/2022   Gas bloat syndrome 06/16/2022   Full incontinence of feces 06/16/2022   Decreased anal sphincter tone 06/16/2022   DVT (deep venous thrombosis) (HCC) 03/05/2022   Acute pulmonary embolism (HCC) 03/05/2022   S/P Nissen fundoplication (without gastrostomy tube) procedure 03/03/2022   Nonrheumatic mitral valve regurgitation 11/12/2021   Nonrheumatic tricuspid valve regurgitation 11/12/2021   Palpitations 11/12/2021   Atrial dilatation 05/20/2021   Constipation 05/20/2021   Hearing loss 05/20/2021   Hiatal hernia 05/20/2021   Iron deficiency anemia 05/20/2021   Lesion of liver 05/20/2021   Obesity 05/20/2021   Osteochondropathy 05/20/2021   Personal history of colonic polyps 05/20/2021   Primary osteoarthritis 05/20/2021   Pulmonary hypertension (HCC) 05/20/2021   Pure hypercholesterolemia 05/20/2021   Vitamin D deficiency 05/20/2021   Laryngopharyngeal reflux (LPR) 05/09/2021   Chronic cough 05/08/2021   Family  history of thyroid cancer 05/08/2021   Ascending aorta dilatation (HCC) 04/09/2021   Abnormal echocardiogram 04/09/2021   Biatrial enlargement 04/09/2021   Mild tricuspid regurgitation 04/09/2021    PCP: Soundra Pilon, FNP  REFERRING PROVIDER: Iva Boop, MD   REFERRING DIAG: R15.9 (ICD-10-CM) - Full incontinence of feces   THERAPY DIAG:  Muscle weakness (generalized)  Unspecified lack of coordination  Abnormal posture  Rationale for Evaluation and Treatment: Rehabilitation  ONSET DATE: January 30,2024  SUBJECTIVE:                                                                                                                                                                                            SUBJECTIVE STATEMENT: Pt had nothing show up on CT scan other than some diverticula in the sigmoid colon and other things that were most likely due to surgeries.  Pt had diarrhea and had taken imodium.  No leakage Fluid intake: Yes: about 60 oz water/green tea    PAIN:  Are you having pain? No   PRECAUTIONS: None  WEIGHT BEARING RESTRICTIONS: No  FALLS:  Has patient fallen in last 6 months? No  LIVING ENVIRONMENT: Lives with: lives with their spouse Lives in: House/apartment   OCCUPATION:   PLOF: Independent  PATIENT GOALS: get bowel movements under control- get back to 1-2/day and not straining  PERTINENT HISTORY:  Hiatal hernia repair 02/2022; 2 vaginal deliveries (10lb baby), umbilical hernia repair Sexual abuse: No  BOWEL MOVEMENT: Pain with bowel movement: Yes have blood at times Type of bowel movement:Type (Bristol Stool Scale) water to constipation, Frequency daily unless taking imodium, and Strain No Fully empty rectum: Yes: sometimes not Leakage: Yes: with gas can be pasty Pads: Yes: at least 5 Fiber supplement: Yes: benefiber  URINATION: Pain with urination: No Fully empty bladder: Yes:   Stream:  Urgency: No Frequency: normal Leakage:  no Pads: No  INTERCOURSE:   PREGNANCY: Vaginal deliveries 2 Tearing Yes: 10 lb baby   PROLAPSE: Nothing of note   OBJECTIVE:   DIAGNOSTIC FINDINGS:  Colonoscopy 1 year ago (saw the hernia initially)   PATIENT SURVEYS:    PFIQ-7   COGNITION: Overall cognitive status: Within functional limits for tasks assessed     SENSATION: Light touch:  Proprioception: Appears intact  MUSCLE LENGTH: Hamstrings: Right 80 deg; Left 90 deg Thomas test:  LUMBAR SPECIAL TESTS:  Straight leg raise test: Negative  FUNCTIONAL TESTS:  Single leg stand normal some instability uses UE initially  GAIT:  Comments: WFL   POSTURE: rounded shoulders, forward head, increased lumbar lordosis, and  decreased thoracic kyphosis  PELVIC ALIGNMENT:  upslip on left side  LUMBARAROM/PROM:  A/PROM A/PROM  eval  Flexion 75%  Extension   Right lateral flexion WFL   Left lateral flexion WFL   Right rotation 75%  Left rotation 75%   (Blank rows = not tested)  LOWER EXTREMITY ROM:  Passive ROM Right eval Left eval  Hip flexion [facility]  [facility]   Hip extension    Hip abduction    Hip adduction    Hip internal rotation [facility]  WFL   Hip external rotation 75% WFL   Knee flexion    Knee extension    Ankle dorsiflexion    Ankle plantarflexion    Ankle inversion    Ankle eversion     (Blank rows = not tested)  LOWER EXTREMITY MMT:  MMT Right eval Left eval  Hip flexion    Hip extension    Hip abduction    Hip adduction    Hip internal rotation    Hip external rotation    Knee flexion    Knee extension    Ankle dorsiflexion    Ankle plantarflexion    Ankle inversion    Ankle eversion     PALPATION:   General  scar tissue well healed across left upper quadrant                External Perineal Exam anal wink present on Rt side                             Internal Pelvic Floor minimal excursion or drawing in, did better when cued to try not to pee, unable to coordinate posterior pelvic floor and bulges and bearing down with every other attempt to tighten  Patient confirms identification and approves PT to assess internal pelvic floor and treatment Yes  PELVIC MMT:   MMT eval  Vaginal   Internal Anal Sphincter 1/5  External Anal Sphincter 1/5  Puborectalis 1/5  Diastasis Recti   (Blank rows = not tested)        TONE: normal  PROLAPSE: no  TODAY'S TREATMENT:                                                                                                                              DATE: 08/03/22  Transversus abdominus activation: supine abduction with blue loop, SLR 10 x bil Transversus abdominus activation and hold 10 sec without holding breath Hip rotation  with  head turns Piriformis stretches 2 ways    PATIENT EDUCATION:  Education details: Access Code: [email or web address] Person educated: Patient Education method: Explanation, Demonstration, Tactile cues, Verbal cues, and Handouts Education comprehension: verbalized understanding and returned demonstration  HOME EXERCISE PROGRAM: Access Code: [email or web address] URL: [email or web address] Date: 07/21/2022 Prepared by: Dwana Curd  Exercises - Supine Diaphragmatic Breathing  - 3 x daily - 7 x weekly - 1 sets - 10 reps - Hooklying Transversus Abdominis Palpation  -  1 x daily - 7 x weekly - 3 sets - 10 reps - Seated Thoracic Flexion and Rotation with Arms Crossed  - 1 x daily - 7 x weekly - 1 sets - 10 reps - 5 sec hold - Seated Thoracic Lumbar Extension  - 1 x daily - 7 x weekly - 1 sets - 10 reps - 5 sec hold  ASSESSMENT:  CLINICAL IMPRESSION:  Today was shorter session due to late arrival.  Pt was able to progress core and add LE movements. Pt was also given some stretches to do for improved gluteal and lumbar mobility.  Pt was able to progress and was getting good transversus abdominus activation today.  Needed some cues to stop holding her breath.  Pt will benefit from skilled PT to address all above impairments in order to return to functional activities and exercise as part of healthy lifestyle.   OBJECTIVE IMPAIRMENTS: decreased coordination, decreased endurance, decreased ROM, decreased strength, increased muscle spasms, impaired flexibility, and postural dysfunction.   ACTIVITY LIMITATIONS: continence, toileting, and exericse  PARTICIPATION LIMITATIONS: interpersonal relationship and community activity  PERSONAL FACTORS: 3+ comorbidities: Hiatal hernia repair 02/2022; 2 vaginal deliveries (10lb baby), umbilical hernia repair  are also affecting patient's functional outcome.   REHAB POTENTIAL: Excellent  CLINICAL DECISION MAKING: Evolving/moderate complexity  EVALUATION  COMPLEXITY: Moderate   GOALS: Goals reviewed with patient? Yes  SHORT TERM GOALS: Target date: 08/18/22  Ind with initial HEP Baseline: Goal status: MET   LONG TERM GOALS: Target date: 10/13/22 Updated 08/03/22  Pt will be independent with advanced HEP to maintain improvements made throughout therapy  Baseline:  Goal status: IN PROGRESS  2.  Pt will report her BMs are complete due to improved bowel habits and evacuation techniques.  Baseline:  Goal status: IN PROGRESS  3.  Pt will be able to functional actions and not have leakage for 1 week Baseline:  Goal status: IN PROGRESS  4.  Pt will notice 50% less gas buildup due to more complete bowel movements. Baseline:  Goal status: IN PROGRESS   PLAN:  PT FREQUENCY: 1-2x/week  PT DURATION: 12 weeks  PLANNED INTERVENTIONS: Therapeutic exercises, Therapeutic activity, Neuromuscular re-education, Balance training, Gait training, Patient/Family education, Self Care, Joint mobilization, Dry Needling, Electrical stimulation, Cryotherapy, Moist heat, Taping, Biofeedback, Manual therapy, and Re-evaluation  PLAN FOR NEXT SESSION: dry needling gluteals and lumbar multifidi, RUSI transperineal for pelvic muscle control   Brayton Caves Kirubel Aja, PT 08/03/2022, 12:35 PM

## 2022-08-04 LAB — UA/M W/RFLX CULTURE, ROUTINE
Bilirubin, UA: NEGATIVE
Glucose, UA: NEGATIVE
Ketones, UA: NEGATIVE
Leukocytes,UA: NEGATIVE
Nitrite, UA: NEGATIVE
RBC, UA: NEGATIVE
Specific Gravity, UA: 1.027 (ref 1.005–1.030)
Urobilinogen, Ur: 0.2 mg/dL (ref 0.2–1.0)
pH, UA: 5.5 (ref 5.0–7.5)

## 2022-08-04 LAB — MICROSCOPIC EXAMINATION
Bacteria, UA: NONE SEEN
Casts: NONE SEEN /lpf
Epithelial Cells (non renal): >10 /hpf — AB (ref 0–10)

## 2022-08-04 LAB — URINE CULTURE, REFLEX

## 2022-08-07 ENCOUNTER — Telehealth: Payer: Self-pay | Admitting: Internal Medicine

## 2022-08-07 NOTE — Telephone Encounter (Signed)
Called patient and reviewed CT scan ordered by gynecology.  There are no findings here that are related to or causative of her GI problems in my opinion.  She did have an episode of diarrhea again after salmon and rice but is feeling better today.  She is continuing with pelvic floor physical therapy.

## 2022-08-08 ENCOUNTER — Other Ambulatory Visit (HOSPITAL_BASED_OUTPATIENT_CLINIC_OR_DEPARTMENT_OTHER): Payer: Self-pay | Admitting: Obstetrics & Gynecology

## 2022-08-08 DIAGNOSIS — R1084 Generalized abdominal pain: Secondary | ICD-10-CM

## 2022-08-08 DIAGNOSIS — R300 Dysuria: Secondary | ICD-10-CM

## 2022-08-08 DIAGNOSIS — R140 Abdominal distension (gaseous): Secondary | ICD-10-CM

## 2022-08-08 DIAGNOSIS — R197 Diarrhea, unspecified: Secondary | ICD-10-CM

## 2022-08-10 ENCOUNTER — Other Ambulatory Visit (HOSPITAL_BASED_OUTPATIENT_CLINIC_OR_DEPARTMENT_OTHER): Payer: Self-pay | Admitting: *Deleted

## 2022-08-10 ENCOUNTER — Other Ambulatory Visit (HOSPITAL_BASED_OUTPATIENT_CLINIC_OR_DEPARTMENT_OTHER): Payer: Self-pay | Admitting: Obstetrics & Gynecology

## 2022-08-10 DIAGNOSIS — R300 Dysuria: Secondary | ICD-10-CM

## 2022-08-10 MED ORDER — NITROFURANTOIN MONOHYD MACRO 100 MG PO CAPS
100.0000 mg | ORAL_CAPSULE | Freq: Two times a day (BID) | ORAL | 0 refills | Status: DC
Start: 2022-08-10 — End: 2022-10-12

## 2022-08-11 ENCOUNTER — Ambulatory Visit: Payer: Medicare Other | Admitting: Physical Therapy

## 2022-08-12 ENCOUNTER — Ambulatory Visit: Payer: Medicare Other | Admitting: Physical Therapy

## 2022-08-17 ENCOUNTER — Telehealth: Payer: Self-pay | Admitting: Internal Medicine

## 2022-08-17 NOTE — Telephone Encounter (Signed)
 Please see notes below.

## 2022-08-17 NOTE — Telephone Encounter (Signed)
Patient called asking for Patty [place] CMA to call her to discuss the treatment that Dr. Leone Payor has her on.

## 2022-08-17 NOTE — Telephone Encounter (Signed)
[name] wanted to update Dr Leone Payor and to ask several questions of him. She wonders if he can call her or does she need a sooner appointment than Oct as it stands now. She did a 1.5 hour visit today with the nutritionist Lowanda Foster today. She liked her. [name] is going to send [place] the visit notes. [name] wants to proceed with the SIBO kit if advised to do so. She has a dry needling appointment on Wednesday. She is still doing the pelvic floor P.T.. She said the [name] is helping.  Generic Levsin is helping her as well. She has also started a probiotic that I will add to her med list: Gut Restore Ultimate.  She is having stool issues. Hard to have a stool and then has diarrhea.  She said she never saw her surgery note. She is going to try and get it. She wants to discuss the gastric scan she had.   [place] She was there to bury her Mom who was 101.

## 2022-08-20 ENCOUNTER — Ambulatory Visit: Payer: Medicare Other | Admitting: Physical Therapy

## 2022-08-20 DIAGNOSIS — R279 Unspecified lack of coordination: Secondary | ICD-10-CM | POA: Diagnosis not present

## 2022-08-20 DIAGNOSIS — M6281 Muscle weakness (generalized): Secondary | ICD-10-CM

## 2022-08-20 DIAGNOSIS — R293 Abnormal posture: Secondary | ICD-10-CM

## 2022-08-20 NOTE — Telephone Encounter (Signed)
She will come tomorrow to pick up the SIBO kit.

## 2022-08-20 NOTE — Telephone Encounter (Signed)
[name]'s specific question is can she do the SIBO kit now Sir?  FYI she did dry needling today. She reports that the Gut Restore Ultimate probiotic is giving her more energy and less diarrhea after 3 weeks of use. Her next nutrition appointment is 09/10/2022.

## 2022-08-20 NOTE — Telephone Encounter (Signed)
OK to do SIBO breath test

## 2022-08-20 NOTE — Telephone Encounter (Signed)
I do not think she needs a sooner appointment.  This is going to take time.  I am unable to call this week.  If she has a specific ? Find out and I will try to answer.

## 2022-08-20 NOTE — Therapy (Signed)
OUTPATIENT PHYSICAL THERAPY FEMALE PELVIC Treatment   Patient Name: [name] MRN: [id number] DOB:10-18-1945, 77 y.o., female Today's Date: 08/20/2022  END OF SESSION:  PT End of Session - 08/20/22 1136     Visit Number 4    Date for PT Re-Evaluation 10/13/22    Authorization Type mcare    PT Start Time 1105    PT Stop Time 1145    PT Time Calculation (min) 40 min    Activity Tolerance Patient tolerated treatment well    Behavior During Therapy WFL for tasks assessed/performed                Past Medical History:  Diagnosis Date   Anemia    Ascending aorta dilatation (HCC)    Atrial dilatation    Bartholin cyst    Compression fracture    lumbar    Constipation    Disorder of bone density and structure, unspecified    Fatty liver    Hearing loss    History of colon polyps    History of hiatal hernia    History of SCC (squamous cell carcinoma) of skin    Hypertension    Liver lesion    Mild cardiomegaly    Mitral regurgitation    Multinodular goiter    Obesity    Other iron deficiency anemia    Primary osteoarthritis    Pulmonary hypertension (HCC)    Pure hypercholesterolemia    Scoliosis    Sensory - neural hearing loss    right   Tremor    Tricuspid regurgitation    Vitamin D deficiency    Past Surgical History:  Procedure Laterality Date   APPENDECTOMY     BONE ANCHORED HEARING AID IMPLANT Right 10/10/2014   Procedure: BONE ANCHORED HEARING AID (BAHA) IMPLANT TEMPORAL BONE RIGHT EAR;  Surgeon: Ermalinda Barrios, MD;  Location: [facility];  Service: ENT;  Laterality: Right;   BREAST REDUCTION SURGERY     CARPAL TUNNEL RELEASE Right    CATARACT EXTRACTION Bilateral    ROTATOR CUFF REPAIR Right    STAPEDECTOMY Bilateral    has metal implants (1 in each ear) - pt cannot have a MRI of her head/brain   UMBILICAL HERNIA REPAIR     XI ROBOTIC ASSISTED HIATAL HERNIA REPAIR N/A 03/03/2022   Procedure: XI ROBOTIC ASSISTED HIATAL HERNIA  REPAIR WITH MESH AND FUNDOPLICATION;  Surgeon: Axel Filler, MD;  Location: [facility];  Service: General;  Laterality: N/A;   Patient Active Problem List   Diagnosis Date Noted   Irritable bowel syndrome with diarrhea 07/30/2022   Pelvic floor dysfunction 07/30/2022   Gas bloat syndrome 06/16/2022   Full incontinence of feces 06/16/2022   Decreased anal sphincter tone 06/16/2022   DVT (deep venous thrombosis) (HCC) 03/05/2022   Acute pulmonary embolism (HCC) 03/05/2022   S/P Nissen fundoplication (without gastrostomy tube) procedure 03/03/2022   Nonrheumatic mitral valve regurgitation 11/12/2021   Nonrheumatic tricuspid valve regurgitation 11/12/2021   Palpitations 11/12/2021   Atrial dilatation 05/20/2021   Constipation 05/20/2021   Hearing loss 05/20/2021   Hiatal hernia 05/20/2021   Iron deficiency anemia 05/20/2021   Lesion of liver 05/20/2021   Obesity 05/20/2021   Osteochondropathy 05/20/2021   Personal history of colonic polyps 05/20/2021   Primary osteoarthritis 05/20/2021   Pulmonary hypertension (HCC) 05/20/2021   Pure hypercholesterolemia 05/20/2021   Vitamin D deficiency 05/20/2021   Laryngopharyngeal reflux (LPR) 05/09/2021   Chronic cough 05/08/2021   Family history of  thyroid cancer 05/08/2021   Ascending aorta dilatation (HCC) 04/09/2021   Abnormal echocardiogram 04/09/2021   Biatrial enlargement 04/09/2021   Mild tricuspid regurgitation 04/09/2021    PCP: Soundra Pilon, FNP  REFERRING PROVIDER: Iva Boop, MD   REFERRING DIAG: R15.9 (ICD-10-CM) - Full incontinence of feces   THERAPY DIAG:  Muscle weakness (generalized)  Unspecified lack of coordination  Abnormal posture  Rationale for Evaluation and Treatment: Rehabilitation  ONSET DATE: January 30,2024  SUBJECTIVE:                                                                                                                                                                                            SUBJECTIVE STATEMENT: Pt still having minimal bowel movements.  Pt is eating more after nutrition appointment and taking probiotics.  Pt has not had to take imodium for a couple of weeks. Fluid intake: Yes: about 60 oz water/green tea    PAIN:  Are you having pain? No   PRECAUTIONS: None  WEIGHT BEARING RESTRICTIONS: No  FALLS:  Has patient fallen in last 6 months? No  LIVING ENVIRONMENT: Lives with: lives with their spouse Lives in: House/apartment   OCCUPATION:   PLOF: Independent  PATIENT GOALS: get bowel movements under control- get back to 1-2/day and not straining  PERTINENT HISTORY:  Hiatal hernia repair 02/2022; 2 vaginal deliveries (10lb baby), umbilical hernia repair Sexual abuse: No  BOWEL MOVEMENT: Pain with bowel movement: Yes have blood at times Type of bowel movement:Type (Bristol Stool Scale) water to constipation, Frequency daily unless taking imodium, and Strain No Fully empty rectum: Yes: sometimes not Leakage: Yes: with gas can be pasty Pads: Yes: at least 5 Fiber supplement: Yes: benefiber  URINATION: Pain with urination: No Fully empty bladder: Yes:   Stream:  Urgency: No Frequency: normal Leakage:  no Pads: No  INTERCOURSE:   PREGNANCY: Vaginal deliveries 2 Tearing Yes: 10 lb baby   PROLAPSE: Nothing of note   OBJECTIVE:   DIAGNOSTIC FINDINGS:  Colonoscopy 1 year ago (saw the hernia initially)   PATIENT SURVEYS:    PFIQ-7   COGNITION: Overall cognitive status: Within functional limits for tasks assessed     SENSATION: Light touch:  Proprioception: Appears intact  MUSCLE LENGTH: Hamstrings: Right 80 deg; Left 90 deg Thomas test:  LUMBAR SPECIAL TESTS:  Straight leg raise test: Negative  FUNCTIONAL TESTS:  Single leg stand normal some instability uses UE initially  GAIT:  Comments: WFL   POSTURE: rounded shoulders, forward head, increased lumbar lordosis, and decreased thoracic kyphosis  PELVIC  ALIGNMENT: upslip on left side  LUMBARAROM/PROM:  A/PROM A/PROM  eval  Flexion 75%  Extension   Right lateral flexion WFL   Left lateral flexion WFL   Right rotation 75%  Left rotation 75%   (Blank rows = not tested)  LOWER EXTREMITY ROM:  Passive ROM Right eval Left eval  Hip flexion [facility]  [facility]   Hip extension    Hip abduction    Hip adduction    Hip internal rotation [facility]  WFL   Hip external rotation 75% WFL   Knee flexion    Knee extension    Ankle dorsiflexion    Ankle plantarflexion    Ankle inversion    Ankle eversion     (Blank rows = not tested)  LOWER EXTREMITY MMT:  MMT Right eval Left eval  Hip flexion    Hip extension    Hip abduction    Hip adduction    Hip internal rotation    Hip external rotation    Knee flexion    Knee extension    Ankle dorsiflexion    Ankle plantarflexion    Ankle inversion    Ankle eversion     PALPATION:   General  scar tissue well healed across left upper quadrant                External Perineal Exam anal wink present on Rt side                             Internal Pelvic Floor minimal excursion or drawing in, did better when cued to try not to pee, unable to coordinate posterior pelvic floor and bulges and bearing down with every other attempt to tighten  Patient confirms identification and approves PT to assess internal pelvic floor and treatment Yes  PELVIC MMT:   MMT eval  Vaginal   Internal Anal Sphincter 1/5  External Anal Sphincter 1/5  Puborectalis 1/5  Diastasis Recti   (Blank rows = not tested)        TONE: normal  PROLAPSE: no  TODAY'S TREATMENT:                                                                                                                              DATE: 08/20/22  IFC to abdomen/low back - 10 min to tolerance for bowel movement and reduced bloating  Manual: low back lumbar paraspinals and fascial release, bilateral glute med Trigger Point Dry-Needling  Treatment  instructions: Expect mild to moderate muscle soreness. S/S of pneumothorax if dry needled over a lung field, and to seek immediate medical attention should they occur. Patient verbalized understanding of these instructions and education.  Patient Consent Given: Yes Education handout provided: Previously provided Muscles treated: lumbar multifidi Electrical stimulation performed: No Parameters: N/A Treatment response/outcome: increased soft tissue length     08/03/22  Transversus abdominus activation: supine abduction with blue loop, SLR 10 x bil Transversus abdominus activation and hold 10  sec without holding breath Hip rotation  with head turns Piriformis stretches 2 ways    PATIENT EDUCATION:  Education details: Access Code: [email or web address] Person educated: Patient Education method: Explanation, Demonstration, Tactile cues, Verbal cues, and Handouts Education comprehension: verbalized understanding and returned demonstration  HOME EXERCISE PROGRAM: Access Code: [email or web address] URL: [email or web address] Date: 07/21/2022 Prepared by: Dwana Curd  Exercises - Supine Diaphragmatic Breathing  - 3 x daily - 7 x weekly - 1 sets - 10 reps - Hooklying Transversus Abdominis Palpation  - 1 x daily - 7 x weekly - 3 sets - 10 reps - Seated Thoracic Flexion and Rotation with Arms Crossed  - 1 x daily - 7 x weekly - 1 sets - 10 reps - 5 sec hold - Seated Thoracic Lumbar Extension  - 1 x daily - 7 x weekly - 1 sets - 10 reps - 5 sec hold  ASSESSMENT:  CLINICAL IMPRESSION:  Pt feels 50% better overall with less bloating and better bowel movements.  Pt is still having fecal smearing and unable to feel when this is occurring.  Had discussed manual with dry needling at previous visit so we went ahead with that plan and pt does have a lot of tension in lumbar and thoracic paraspinals and down to multifidi as well as bilateral glutea medius.  Pt had good response from treatment and ended  with IFC stim to abdomen and low back for improved relaxation and increased colon motility.  Pt will benefit from skilled PT to address muscle length and activation of the pelvic floor for improved bowel control.  OBJECTIVE IMPAIRMENTS: decreased coordination, decreased endurance, decreased ROM, decreased strength, increased muscle spasms, impaired flexibility, and postural dysfunction.   ACTIVITY LIMITATIONS: continence, toileting, and exericse  PARTICIPATION LIMITATIONS: interpersonal relationship and community activity  PERSONAL FACTORS: 3+ comorbidities: Hiatal hernia repair 02/2022; 2 vaginal deliveries (10lb baby), umbilical hernia repair  are also affecting patient's functional outcome.   REHAB POTENTIAL: Excellent  CLINICAL DECISION MAKING: Evolving/moderate complexity  EVALUATION COMPLEXITY: Moderate   GOALS: Goals reviewed with patient? Yes  SHORT TERM GOALS: Target date: 08/18/22  Ind with initial HEP Baseline: Goal status: MET   LONG TERM GOALS: Target date: 10/13/22 Updated 08/20/22  Pt will be independent with advanced HEP to maintain improvements made throughout therapy  Baseline:  Goal status: IN PROGRESS  2.  Pt will report her BMs are complete due to improved bowel habits and evacuation techniques.  Baseline: still having some leakage after Goal status: IN PROGRESS  3.  Pt will be able to functional actions and not have leakage for 1 week Baseline: better - no diarrhea Goal status: IN PROGRESS  4.  Pt will notice 50% less gas buildup due to more complete bowel movements. Baseline: better Goal status: IN PROGRESS   PLAN:  PT FREQUENCY: 1-2x/week  PT DURATION: 12 weeks  PLANNED INTERVENTIONS: Therapeutic exercises, Therapeutic activity, Neuromuscular re-education, Balance training, Gait training, Patient/Family education, Self Care, Joint mobilization, Dry Needling, Electrical stimulation, Cryotherapy, Moist heat, Taping, Biofeedback, Manual therapy,  and Re-evaluation  PLAN FOR NEXT SESSION: f/u on 1st dry needling gluteals and lumbar multifidi, re-assess pelvic floor rectally to make sure getting full range of motion and improved sensation, RUSI transperineal for pelvic muscle control   Brayton Caves Thaila Bottoms, PT 08/20/2022, 11:37 AM

## 2022-08-21 NOTE — Telephone Encounter (Signed)
[name] came by and picked up her SIBO kit. The order for this has been faxed to [facility].

## 2022-08-24 NOTE — Therapy (Unsigned)
OUTPATIENT PHYSICAL THERAPY FEMALE PELVIC Treatment   Patient Name: [name] MRN: [id number] DOB:Mar 26, 1945, 77 y.o., female Today's Date: 08/24/2022  END OF SESSION:       Past Medical History:  Diagnosis Date   Anemia    Ascending aorta dilatation (HCC)    Atrial dilatation    Bartholin cyst    Compression fracture    lumbar    Constipation    Disorder of bone density and structure, unspecified    Fatty liver    Hearing loss    History of colon polyps    History of hiatal hernia    History of SCC (squamous cell carcinoma) of skin    Hypertension    Liver lesion    Mild cardiomegaly    Mitral regurgitation    Multinodular goiter    Obesity    Other iron deficiency anemia    Primary osteoarthritis    Pulmonary hypertension (HCC)    Pure hypercholesterolemia    Scoliosis    Sensory - neural hearing loss    right   Tremor    Tricuspid regurgitation    Vitamin D deficiency    Past Surgical History:  Procedure Laterality Date   APPENDECTOMY     BONE ANCHORED HEARING AID IMPLANT Right 10/10/2014   Procedure: BONE ANCHORED HEARING AID (BAHA) IMPLANT TEMPORAL BONE RIGHT EAR;  Surgeon: Ermalinda Barrios, MD;  Location: [facility];  Service: ENT;  Laterality: Right;   BREAST REDUCTION SURGERY     CARPAL TUNNEL RELEASE Right    CATARACT EXTRACTION Bilateral    ROTATOR CUFF REPAIR Right    STAPEDECTOMY Bilateral    has metal implants (1 in each ear) - pt cannot have a MRI of her head/brain   UMBILICAL HERNIA REPAIR     XI ROBOTIC ASSISTED HIATAL HERNIA REPAIR N/A 03/03/2022   Procedure: XI ROBOTIC ASSISTED HIATAL HERNIA REPAIR WITH MESH AND FUNDOPLICATION;  Surgeon: Axel Filler, MD;  Location: [facility];  Service: General;  Laterality: N/A;   Patient Active Problem List   Diagnosis Date Noted   Irritable bowel syndrome with diarrhea 07/30/2022   Pelvic floor dysfunction 07/30/2022   Gas bloat syndrome 06/16/2022   Full incontinence of feces  06/16/2022   Decreased anal sphincter tone 06/16/2022   DVT (deep venous thrombosis) (HCC) 03/05/2022   Acute pulmonary embolism (HCC) 03/05/2022   S/P Nissen fundoplication (without gastrostomy tube) procedure 03/03/2022   Nonrheumatic mitral valve regurgitation 11/12/2021   Nonrheumatic tricuspid valve regurgitation 11/12/2021   Palpitations 11/12/2021   Atrial dilatation 05/20/2021   Constipation 05/20/2021   Hearing loss 05/20/2021   Hiatal hernia 05/20/2021   Iron deficiency anemia 05/20/2021   Lesion of liver 05/20/2021   Obesity 05/20/2021   Osteochondropathy 05/20/2021   Personal history of colonic polyps 05/20/2021   Primary osteoarthritis 05/20/2021   Pulmonary hypertension (HCC) 05/20/2021   Pure hypercholesterolemia 05/20/2021   Vitamin D deficiency 05/20/2021   Laryngopharyngeal reflux (LPR) 05/09/2021   Chronic cough 05/08/2021   Family history of thyroid cancer 05/08/2021   Ascending aorta dilatation (HCC) 04/09/2021   Abnormal echocardiogram 04/09/2021   Biatrial enlargement 04/09/2021   Mild tricuspid regurgitation 04/09/2021    PCP: Soundra Pilon, FNP  REFERRING PROVIDER: Iva Boop, MD   REFERRING DIAG: R15.9 (ICD-10-CM) - Full incontinence of feces   THERAPY DIAG:  No diagnosis found.  Rationale for Evaluation and Treatment: Rehabilitation  ONSET DATE: January 30,2024  SUBJECTIVE:  SUBJECTIVE STATEMENT: Pt still having minimal bowel movements.  Pt is eating more after nutrition appointment and taking probiotics.  Pt has not had to take imodium for a couple of weeks. Fluid intake: Yes: about 60 oz water/green tea    PAIN:  Are you having pain? No   PRECAUTIONS: None  WEIGHT BEARING RESTRICTIONS: No  FALLS:  Has patient fallen in last 6 months?  No  LIVING ENVIRONMENT: Lives with: lives with their spouse Lives in: House/apartment   OCCUPATION:   PLOF: Independent  PATIENT GOALS: get bowel movements under control- get back to 1-2/day and not straining  PERTINENT HISTORY:  Hiatal hernia repair 02/2022; 2 vaginal deliveries (10lb baby), umbilical hernia repair Sexual abuse: No  BOWEL MOVEMENT: Pain with bowel movement: Yes have blood at times Type of bowel movement:Type (Bristol Stool Scale) water to constipation, Frequency daily unless taking imodium, and Strain No Fully empty rectum: Yes: sometimes not Leakage: Yes: with gas can be pasty Pads: Yes: at least 5 Fiber supplement: Yes: benefiber  URINATION: Pain with urination: No Fully empty bladder: Yes:   Stream:  Urgency: No Frequency: normal Leakage:  no Pads: No  INTERCOURSE:   PREGNANCY: Vaginal deliveries 2 Tearing Yes: 10 lb baby   PROLAPSE: Nothing of note   OBJECTIVE:   DIAGNOSTIC FINDINGS:  Colonoscopy 1 year ago (saw the hernia initially)   PATIENT SURVEYS:    PFIQ-7   COGNITION: Overall cognitive status: Within functional limits for tasks assessed     SENSATION: Light touch:  Proprioception: Appears intact  MUSCLE LENGTH: Hamstrings: Right 80 deg; Left 90 deg Thomas test:  LUMBAR SPECIAL TESTS:  Straight leg raise test: Negative  FUNCTIONAL TESTS:  Single leg stand normal some instability uses UE initially  GAIT:  Comments: WFL   POSTURE: rounded shoulders, forward head, increased lumbar lordosis, and decreased thoracic kyphosis  PELVIC ALIGNMENT: upslip on left side  LUMBARAROM/PROM:  A/PROM A/PROM  eval  Flexion 75%  Extension   Right lateral flexion WFL   Left lateral flexion WFL   Right rotation 75%  Left rotation 75%   (Blank rows = not tested)  LOWER EXTREMITY ROM:  Passive ROM Right eval Left eval  Hip flexion [facility]   Hip extension    Hip abduction    Hip adduction    Hip internal rotation  [facility]  WFL   Hip external rotation 75% WFL   Knee flexion    Knee extension    Ankle dorsiflexion    Ankle plantarflexion    Ankle inversion    Ankle eversion     (Blank rows = not tested)  LOWER EXTREMITY MMT:  MMT Right eval Left eval  Hip flexion    Hip extension    Hip abduction    Hip adduction    Hip internal rotation    Hip external rotation    Knee flexion    Knee extension    Ankle dorsiflexion    Ankle plantarflexion    Ankle inversion    Ankle eversion     PALPATION:   General  scar tissue well healed across left upper quadrant                External Perineal Exam anal wink present on Rt side                             Internal Pelvic Floor minimal excursion or drawing in, did  better when cued to try not to pee, unable to coordinate posterior pelvic floor and bulges and bearing down with every other attempt to tighten  Patient confirms identification and approves PT to assess internal pelvic floor and treatment Yes  PELVIC MMT:   MMT eval  Vaginal   Internal Anal Sphincter 1/5  External Anal Sphincter 1/5  Puborectalis 1/5  Diastasis Recti   (Blank rows = not tested)        TONE: normal  PROLAPSE: no  TODAY'S TREATMENT:                                                                                                                              DATE: 08/20/22  IFC to abdomen/low back - 10 min to tolerance for bowel movement and reduced bloating  Manual: low back lumbar paraspinals and fascial release, bilateral glute med Trigger Point Dry-Needling  Treatment instructions: Expect mild to moderate muscle soreness. S/S of pneumothorax if dry needled over a lung field, and to seek immediate medical attention should they occur. Patient verbalized understanding of these instructions and education.  Patient Consent Given: Yes Education handout provided: Previously provided Muscles treated: lumbar multifidi Electrical stimulation performed:  No Parameters: N/A Treatment response/outcome: increased soft tissue length     08/03/22  Transversus abdominus activation: supine abduction with blue loop, SLR 10 x bil Transversus abdominus activation and hold 10 sec without holding breath Hip rotation  with head turns Piriformis stretches 2 ways    PATIENT EDUCATION:  Education details: Access Code: [email or web address] Person educated: Patient Education method: Explanation, Demonstration, Tactile cues, Verbal cues, and Handouts Education comprehension: verbalized understanding and returned demonstration  HOME EXERCISE PROGRAM: Access Code: [email or web address] URL: [email or web address] Date: 07/21/2022 Prepared by: Dwana Curd  Exercises - Supine Diaphragmatic Breathing  - 3 x daily - 7 x weekly - 1 sets - 10 reps - Hooklying Transversus Abdominis Palpation  - 1 x daily - 7 x weekly - 3 sets - 10 reps - Seated Thoracic Flexion and Rotation with Arms Crossed  - 1 x daily - 7 x weekly - 1 sets - 10 reps - 5 sec hold - Seated Thoracic Lumbar Extension  - 1 x daily - 7 x weekly - 1 sets - 10 reps - 5 sec hold  ASSESSMENT:  CLINICAL IMPRESSION:  Pt feels 50% better overall with less bloating and better bowel movements.  Pt is still having fecal smearing and unable to feel when this is occurring.  Had discussed manual with dry needling at previous visit so we went ahead with that plan and pt does have a lot of tension in lumbar and thoracic paraspinals and down to multifidi as well as bilateral glutea medius.  Pt had good response from treatment and ended with IFC stim to abdomen and low back for improved relaxation and increased colon motility.  Pt will benefit from skilled PT to address muscle length and activation of the pelvic floor  for improved bowel control.  OBJECTIVE IMPAIRMENTS: decreased coordination, decreased endurance, decreased ROM, decreased strength, increased muscle spasms, impaired flexibility, and postural  dysfunction.   ACTIVITY LIMITATIONS: continence, toileting, and exericse  PARTICIPATION LIMITATIONS: interpersonal relationship and community activity  PERSONAL FACTORS: 3+ comorbidities: Hiatal hernia repair 02/2022; 2 vaginal deliveries (10lb baby), umbilical hernia repair  are also affecting patient's functional outcome.   REHAB POTENTIAL: Excellent  CLINICAL DECISION MAKING: Evolving/moderate complexity  EVALUATION COMPLEXITY: Moderate   GOALS: Goals reviewed with patient? Yes  SHORT TERM GOALS: Target date: 08/18/22  Ind with initial HEP Baseline: Goal status: MET   LONG TERM GOALS: Target date: 10/13/22 Updated 08/20/22  Pt will be independent with advanced HEP to maintain improvements made throughout therapy  Baseline:  Goal status: IN PROGRESS  2.  Pt will report her BMs are complete due to improved bowel habits and evacuation techniques.  Baseline: still having some leakage after Goal status: IN PROGRESS  3.  Pt will be able to functional actions and not have leakage for 1 week Baseline: better - no diarrhea Goal status: IN PROGRESS  4.  Pt will notice 50% less gas buildup due to more complete bowel movements. Baseline: better Goal status: IN PROGRESS   PLAN:  PT FREQUENCY: 1-2x/week  PT DURATION: 12 weeks  PLANNED INTERVENTIONS: Therapeutic exercises, Therapeutic activity, Neuromuscular re-education, Balance training, Gait training, Patient/Family education, Self Care, Joint mobilization, Dry Needling, Electrical stimulation, Cryotherapy, Moist heat, Taping, Biofeedback, Manual therapy, and Re-evaluation  PLAN FOR NEXT SESSION: f/u on 1st dry needling gluteals and lumbar multifidi, re-assess pelvic floor rectally to make sure getting full range of motion and improved sensation, RUSI transperineal for pelvic muscle control   Brayton Caves Daysie Helf, PT 08/24/2022, 4:16 PM

## 2022-08-25 ENCOUNTER — Ambulatory Visit: Payer: Medicare Other | Admitting: Physical Therapy

## 2022-08-25 DIAGNOSIS — M6281 Muscle weakness (generalized): Secondary | ICD-10-CM | POA: Diagnosis not present

## 2022-08-25 DIAGNOSIS — R279 Unspecified lack of coordination: Secondary | ICD-10-CM

## 2022-08-25 DIAGNOSIS — R293 Abnormal posture: Secondary | ICD-10-CM | POA: Diagnosis not present

## 2022-08-31 ENCOUNTER — Encounter: Payer: Self-pay | Admitting: Physical Therapy

## 2022-09-02 ENCOUNTER — Encounter: Payer: Medicare Other | Admitting: Physical Therapy

## 2022-09-10 ENCOUNTER — Ambulatory Visit: Payer: Medicare Other | Attending: Internal Medicine | Admitting: Physical Therapy

## 2022-09-10 DIAGNOSIS — M6281 Muscle weakness (generalized): Secondary | ICD-10-CM | POA: Diagnosis not present

## 2022-09-10 DIAGNOSIS — R293 Abnormal posture: Secondary | ICD-10-CM | POA: Insufficient documentation

## 2022-09-10 DIAGNOSIS — R279 Unspecified lack of coordination: Secondary | ICD-10-CM | POA: Diagnosis not present

## 2022-09-10 NOTE — Therapy (Signed)
OUTPATIENT PHYSICAL THERAPY FEMALE PELVIC Treatment   Patient Name: [name] MRN: [id number] DOB:[birth year], 77 y.o., female Today's Date: 09/10/2022  END OF SESSION:  PT End of Session - 09/10/22 1149     Visit Number 6    Date for PT Re-Evaluation 10/13/22    Authorization Type mcare    PT Start Time 1145    PT Stop Time 1226    PT Time Calculation (min) 41 min    Activity Tolerance Patient tolerated treatment well    Behavior During Therapy WFL for tasks assessed/performed                 Past Medical History:  Diagnosis Date   Anemia    Ascending aorta dilatation (HCC)    Atrial dilatation    Bartholin cyst    Compression fracture    lumbar    Constipation    Disorder of bone density and structure, unspecified    Fatty liver    Hearing loss    History of colon polyps    History of hiatal hernia    History of SCC (squamous cell carcinoma) of skin    Hypertension    Liver lesion    Mild cardiomegaly    Mitral regurgitation    Multinodular goiter    Obesity    Other iron deficiency anemia    Primary osteoarthritis    Pulmonary hypertension (HCC)    Pure hypercholesterolemia    Scoliosis    Sensory - neural hearing loss    right   Tremor    Tricuspid regurgitation    Vitamin D deficiency    Past Surgical History:  Procedure Laterality Date   APPENDECTOMY     BONE ANCHORED HEARING AID IMPLANT Right 10/10/2014   Procedure: BONE ANCHORED HEARING AID (BAHA) IMPLANT TEMPORAL BONE RIGHT EAR;  Surgeon: Ermalinda Barrios, MD;  Location: [facility];  Service: ENT;  Laterality: Right;   BREAST REDUCTION SURGERY     CARPAL TUNNEL RELEASE Right    CATARACT EXTRACTION Bilateral    ROTATOR CUFF REPAIR Right    STAPEDECTOMY Bilateral    has metal implants (1 in each ear) - pt cannot have a MRI of her head/brain   UMBILICAL HERNIA REPAIR     XI ROBOTIC ASSISTED HIATAL HERNIA REPAIR N/A 03/03/2022   Procedure: XI ROBOTIC ASSISTED HIATAL HERNIA  REPAIR WITH MESH AND FUNDOPLICATION;  Surgeon: Axel Filler, MD;  Location: [facility] OR;  Service: General;  Laterality: N/A;   Patient Active Problem List   Diagnosis Date Noted   Irritable bowel syndrome with diarrhea 07/30/2022   Pelvic floor dysfunction 07/30/2022   Gas bloat syndrome 06/16/2022   Full incontinence of feces 06/16/2022   Decreased anal sphincter tone 06/16/2022   DVT (deep venous thrombosis) (HCC) 03/05/2022   Acute pulmonary embolism (HCC) 03/05/2022   S/P Nissen fundoplication (without gastrostomy tube) procedure 03/03/2022   Nonrheumatic mitral valve regurgitation 11/12/2021   Nonrheumatic tricuspid valve regurgitation 11/12/2021   Palpitations 11/12/2021   Atrial dilatation 05/20/2021   Constipation 05/20/2021   Hearing loss 05/20/2021   Hiatal hernia 05/20/2021   Iron deficiency anemia 05/20/2021   Lesion of liver 05/20/2021   Obesity 05/20/2021   Osteochondropathy 05/20/2021   Personal history of colonic polyps 05/20/2021   Primary osteoarthritis 05/20/2021   Pulmonary hypertension (HCC) 05/20/2021   Pure hypercholesterolemia 05/20/2021   Vitamin D deficiency 05/20/2021   Laryngopharyngeal reflux (LPR) 05/09/2021   Chronic cough 05/08/2021   Family history  of thyroid cancer 05/08/2021   Ascending aorta dilatation (HCC) 04/09/2021   Abnormal echocardiogram 04/09/2021   Biatrial enlargement 04/09/2021   Mild tricuspid regurgitation 04/09/2021    PCP: Soundra Pilon, FNP  REFERRING PROVIDER: Iva Boop, MD   REFERRING DIAG: R15.9 (ICD-10-CM) - Full incontinence of feces   THERAPY DIAG:  Muscle weakness (generalized)  Unspecified lack of coordination  Abnormal posture  Rationale for Evaluation and Treatment: Rehabilitation  ONSET DATE: January 30,2024  SUBJECTIVE:                                                                                                                                                                                            SUBJECTIVE STATEMENT: Hasn't had stool leakage in the past week (now just a slight stain on pad), has started working out 3x per week for about 1.5 hours and thinks this has been helping. Has been working on diet which helps, only on occasion needs to take imodium, does think she has some diary intolerance.  Is activating pelvic floor with exercises at gym.   Fluid intake: Yes: about 60 oz water/green tea    PAIN:  Are you having pain? No   PRECAUTIONS: None  WEIGHT BEARING RESTRICTIONS: No  FALLS:  Has patient fallen in last 6 months? No  LIVING ENVIRONMENT: Lives with: lives with their spouse Lives in: House/apartment   OCCUPATION:   PLOF: Independent  PATIENT GOALS: get bowel movements under control- get back to 1-2/day and not straining  PERTINENT HISTORY:  Hiatal hernia repair 02/2022; 2 vaginal deliveries (10lb baby), umbilical hernia repair Sexual abuse: No  BOWEL MOVEMENT: Pain with bowel movement: Yes have blood at times Type of bowel movement:Type (Bristol Stool Scale) water to constipation, Frequency daily unless taking imodium, and Strain No Fully empty rectum: Yes: sometimes not Leakage: Yes: with gas can be pasty Pads: Yes: at least 5 Fiber supplement: Yes: benefiber  URINATION: Pain with urination: No Fully empty bladder: Yes:   Stream:  Urgency: No Frequency: normal Leakage:  no Pads: No  INTERCOURSE:   PREGNANCY: Vaginal deliveries 2 Tearing Yes: 10 lb baby   PROLAPSE: Nothing of note   OBJECTIVE:   DIAGNOSTIC FINDINGS:  Colonoscopy 1 year ago (saw the hernia initially)   PATIENT SURVEYS:    PFIQ-7   COGNITION: Overall cognitive status: Within functional limits for tasks assessed     SENSATION: Light touch:  Proprioception: Appears intact  MUSCLE LENGTH: Hamstrings: Right 80 deg; Left 90 deg Thomas test:  LUMBAR SPECIAL TESTS:  Straight leg raise test: Negative  FUNCTIONAL TESTS:  Single leg stand normal  some  instability uses UE initially  GAIT:  Comments: WFL   POSTURE: rounded shoulders, forward head, increased lumbar lordosis, and decreased thoracic kyphosis  PELVIC ALIGNMENT: upslip on left side  LUMBARAROM/PROM:  A/PROM A/PROM  eval  Flexion 75%  Extension   Right lateral flexion WFL   Left lateral flexion WFL   Right rotation 75%  Left rotation 75%   (Blank rows = not tested)  LOWER EXTREMITY ROM:  Passive ROM Right eval Left eval  Hip flexion [facility]   Hip extension    Hip abduction    Hip adduction    Hip internal rotation [facility]  WFL   Hip external rotation 75% WFL   Knee flexion    Knee extension    Ankle dorsiflexion    Ankle plantarflexion    Ankle inversion    Ankle eversion     (Blank rows = not tested)  LOWER EXTREMITY MMT:  MMT Right eval Left eval  Hip flexion    Hip extension    Hip abduction    Hip adduction    Hip internal rotation    Hip external rotation    Knee flexion    Knee extension    Ankle dorsiflexion    Ankle plantarflexion    Ankle inversion    Ankle eversion     PALPATION:   General  scar tissue well healed across left upper quadrant                External Perineal Exam anal wink present on Rt side                             Internal Pelvic Floor minimal excursion or drawing in, did better when cued to try not to pee, unable to coordinate posterior pelvic floor and bulges and bearing down with every other attempt to tighten  Patient confirms identification and approves PT to assess internal pelvic floor and treatment Yes  PELVIC MMT:   MMT eval  Vaginal   Internal Anal Sphincter 1/5  External Anal Sphincter 1/5  Puborectalis 1/5  Diastasis Recti   (Blank rows = not tested)        TONE: normal  PROLAPSE: no  TODAY'S TREATMENT:                                                                                                                              DATE:   09/10/22:  Pt educated on abdominal  massage, hand out given and reviewed Manual - abdominal massage with pt in hooklying for improved peristalsis for more consistent bowel movements, less leakage, and decreased urgency with any looser stools. Pt did have tension in Lt side of abdomen but did improve with manual work.   X10 diaphragmatic breathing and x10 TA activations in hooklying as pt wanted to insure she was doing this correctly at home - all correct  PATIENT EDUCATION:  Education details: Access Code: [email or web address] Person educated: Patient Education method: Explanation, Demonstration, Tactile cues, Verbal cues, and Handouts Education comprehension: verbalized understanding and returned demonstration  HOME EXERCISE PROGRAM: Access Code: [email or web address] URL: [email or web address] Date: 07/21/2022 Prepared by: Dwana Curd  Exercises - Supine Diaphragmatic Breathing  - 3 x daily - 7 x weekly - 1 sets - 10 reps - Hooklying Transversus Abdominis Palpation  - 1 x daily - 7 x weekly - 3 sets - 10 reps - Seated Thoracic Flexion and Rotation with Arms Crossed  - 1 x daily - 7 x weekly - 1 sets - 10 reps - 5 sec hold - Seated Thoracic Lumbar Extension  - 1 x daily - 7 x weekly - 1 sets - 10 reps - 5 sec hold  ASSESSMENT:  CLINICAL IMPRESSION: Pt reports improvement in symptoms overall, pleased with progress. Pt educated and treatment focused on abdominal massage and pt reported understanding, denied question. Plan for next session to work on coordination of pelvic floor with activity and stressors.  Pt will benefit from skilled PT to address coordination and strength of the pelvic floor for improved bowel control.  OBJECTIVE IMPAIRMENTS: decreased coordination, decreased endurance, decreased ROM, decreased strength, increased muscle spasms, impaired flexibility, and postural dysfunction.   ACTIVITY LIMITATIONS: continence, toileting, and exericse  PARTICIPATION LIMITATIONS: interpersonal relationship and  community activity  PERSONAL FACTORS: 3+ comorbidities: Hiatal hernia repair 02/2022; 2 vaginal deliveries (10lb baby), umbilical hernia repair  are also affecting patient's functional outcome.   REHAB POTENTIAL: Excellent  CLINICAL DECISION MAKING: Evolving/moderate complexity  EVALUATION COMPLEXITY: Moderate   GOALS: Goals reviewed with patient? Yes  SHORT TERM GOALS: Target date: 08/18/22  Ind with initial HEP Baseline: Goal status: MET   LONG TERM GOALS: Target date: 10/13/22 Updated 08/20/22  Pt will be independent with advanced HEP to maintain improvements made throughout therapy  Baseline:  Goal status: MET  2.  Pt will report her BMs are complete due to improved bowel habits and evacuation techniques.  Baseline: still having some leakage after Goal status: MET  3.  Pt will be able to functional actions and not have leakage for 1 week Baseline: better - no diarrhea Goal status: IN PROGRESS  4.  Pt will notice 50% less gas buildup due to more complete bowel movements. Baseline: better Goal status: IN PROGRESS   PLAN:  PT FREQUENCY: 1-2x/week  PT DURATION: 12 weeks  PLANNED INTERVENTIONS: Therapeutic exercises, Therapeutic activity, Neuromuscular re-education, Balance training, Gait training, Patient/Family education, Self Care, Joint mobilization, Dry Needling, Electrical stimulation, Cryotherapy, Moist heat, Taping, Biofeedback, Manual therapy, and Re-evaluation  PLAN FOR NEXT SESSION: continue working on anal sphincter strength and coordination tactile cues/RUSI   Barbaraann Faster, PT 09/10/2022, 12:32 PM

## 2022-09-23 ENCOUNTER — Ambulatory Visit: Payer: Medicare Other | Admitting: Physical Therapy

## 2022-09-30 ENCOUNTER — Encounter: Payer: Medicare Other | Admitting: Physical Therapy

## 2022-10-12 ENCOUNTER — Ambulatory Visit: Payer: Medicare Other | Admitting: Cardiology

## 2022-10-12 ENCOUNTER — Encounter: Payer: Self-pay | Admitting: Cardiology

## 2022-10-12 VITALS — BP 131/77 | HR 63 | Resp 16 | Ht 64.0 in | Wt 175.0 lb

## 2022-10-12 DIAGNOSIS — I340 Nonrheumatic mitral (valve) insufficiency: Secondary | ICD-10-CM

## 2022-10-12 DIAGNOSIS — I361 Nonrheumatic tricuspid (valve) insufficiency: Secondary | ICD-10-CM

## 2022-10-12 MED ORDER — METOPROLOL SUCCINATE ER 25 MG PO TB24: 25.0000 mg | ORAL_TABLET | Freq: Every day | ORAL | 3 refills | Status: DC

## 2022-10-12 NOTE — Progress Notes (Signed)
Patient referred by Soundra Pilon, FNP for Ascending aorta dilatation, severe LA enlargement  Subjective:   [name], female    DOB: 1945-11-17, 77 y.o.   MRN: [id number]   Chief Complaint  Patient presents with   Acute pulmonary embolism without acute cor pulmonale, unspe   Follow-up     HPI  77 year old Caucasian female with  hyperlipidemia, chronic cough, hiatal hernia, h/o perioperative DVT/PE (03/2022).  Patient had DVT/PE after Nissen fundoplication surgery in 03/2022. She is now on Eliquis. She has had ongoing gastric issues with bloating sensation. She is seeing the surgeons for the same. She has had increased fatigue since then.  She is doing well from cardiac standpoint. Her biggest complaint continue to be related to GI disturbance with bloating, diarrhea etc. She denies any bleeding issues.    Initial consultation visit 04/2021: Patient is here with her husband.  She is very pleasant lady, who is fairly active with regular walking etc.  Ever since she had COVID few months ago, she is experienced chronic cough and congestion in the sinuses to the point that her voice is changed.  On work-up for chronic cough, she was found to have a large hiatal hernia.  She was scheduled to undergo colonoscopy as well as EGD with gastroenterology.  However, screening echocardiogram showed certain abnormalities, including severe left atrial enlargement, mild dilation of ascending aorta 38 mm, mildly elevated PASP 39 mmHg.   Patient denies any chest pain or shortness of breath at baseline.  She also denies any orthopnea, PND, leg edema symptoms.  She has no known history of atrial fibrillation, denies any palpitation symptoms.     Current Outpatient Medications:    nitrofurantoin, macrocrystal-monohydrate, (MACROBID) 100 MG capsule, Take 1 capsule (100 mg total) by mouth 2 (two) times daily., Disp: 10 capsule, Rfl: 0   acetaminophen (TYLENOL) 500 MG tablet, Take 1,000 mg by mouth  every 8 (eight) hours as needed (pain.)., Disp: , Rfl:    apixaban (ELIQUIS) 5 MG TABS tablet, Take 1 tablet by mouth twice daily., Disp: 180 tablet, Rfl: 2   Brimonidine Tartrate (LUMIFY) 0.025 % SOLN, Place 1 drop into both eyes 2 (two) times daily as needed (dry/irritated eyes.)., Disp: , Rfl:    carboxymethylcellulose (REFRESH PLUS) 0.5 % SOLN, Place 1 drop into both eyes 3 (three) times daily as needed (dry/irritated eyes.)., Disp: , Rfl:    cetirizine (ZYRTEC) 10 MG tablet, Take 10 mg by mouth daily as needed (seasonal allergies.)., Disp: , Rfl:    COLLAGEN PO, Take 2 Scoops by mouth daily. Mix with coffee (Collagen Peptides), Disp: , Rfl:    fluorouracil (EFUDEX) 5 % cream, Apply 1 Application topically 2 (two) times daily as needed (skin lesions)., Disp: , Rfl:    hyoscyamine (LEVSIN SL) 0.125 MG SL tablet, DISSOLVE 1 TABLET(0.125 MG) UNDER THE TONGUE EVERY 4 HOURS AS NEEDED FOR CRAMPING OR GAS OR BLOATING, Disp: 60 tablet, Rfl: 5   Magnesium 250 MG TABS, Take 250 mg by mouth daily., Disp: , Rfl:    melatonin 5 MG TABS, Take 5 mg by mouth at bedtime., Disp: , Rfl:    metoprolol succinate (TOPROL-XL) 25 MG 24 hr tablet, Take 25 mg by mouth at bedtime., Disp: , Rfl:    METRONIDAZOLE, TOPICAL, 0.75 % LOTN, Apply 1 Application topically daily as needed (rosacea)., Disp: , Rfl:    OVER THE COUNTER MEDICATION, OTC: Gut Restore Ultimate Sig: 2 capsules every AM, Disp: , Rfl:  rosuvastatin (CRESTOR) 10 MG tablet, Take 10 mg by mouth at bedtime., Disp: , Rfl:    tretinoin (RETIN-A) 0.05 % cream, Apply 1 application  topically daily as needed (rosacea.)., Disp: , Rfl:    Cardiovascular and other pertinent studies:  Reviewed external labs and tests, independently interpreted  EKG 07/15/2022: Sinus rhythm 56 bpm Nonspecific T-abnormality Low voltage  Echocardiogram 06/18/2022: Normal LV systolic function with visual EF 55-60%. Left ventricle cavity is normal in size. Normal left ventricular  wall thickness. Normal global wall motion. Doppler evidence of grade II (pseudonormal) diastolic dysfunction, elevated LAP. Calculated EF 57%. Left atrial cavity is severely dilated at 65 ml/m^2. Right atrial cavity is mild to moderately dilated. Structurally normal mitral valve. Moderate (Grade II) mitral regurgitation. Structurally normal tricuspid valve. Moderate tricuspid regurgitation. Mild pulmonary hypertension. RVSP measures 42 mmHg. Structurally normal pulmonic valve. Moderate pulmonic regurgitation. Compared to 11/2021, there is now grade II DD with elevated LAP.   Echocardiogram 03/05/2022: 1. Left ventricular ejection fraction, by estimation, is 55 to 60%. The  left ventricle has normal function. The left ventricle has no regional  wall motion abnormalities. Left ventricular diastolic parameters are  indeterminate.   2. Right ventricular systolic function is normal. The right ventricular  size is upper limit normal. There is moderately elevated pulmonary artery  systolic pressure.   3. Left atrial size was moderately dilated.   4. Right atrial size was moderately dilated.   5. The mitral valve is normal in structure. Mild mitral valve  regurgitation. No evidence of mitral stenosis.   6. Tricuspid valve regurgitation is moderate.   7. The aortic valve is normal in structure. Aortic valve regurgitation is  not visualized. No aortic stenosis is present.   8. The inferior vena cava is dilated in size with >50% respiratory  variability, suggesting right atrial pressure of 8 mmHg.   Comparison(s): Compared to outpatient study in 11/2021, estimated PASP  increased from 30 mmHg.    CTA chest 03/04/2022: Large linear filling defect seen in distal left pulmonary artery extending into upper lobe branches consistent with acute pulmonary embolus. Smaller filling defect seen in peripheral portion of upper lobe branch of right pulmonary artery. Positive for acute PE with CTevidence  of right heart strain (RV/LV Ratio = 1.9) consistent with at least submassive (intermediate risk) PE. The presence of right heart strain has been associated with an increased risk of morbidity and mortality.   Large amount of fluid and gas seen in distal esophageal region consistent with recent hiatal hernia surgery. Gas is also noted in the anterior mediastinum and subcutaneous tissues of the anterior chest wall also most likely due to recent surgery.   Mild bilateral posterior basilar subsegmental atelectasis.   Aortic Atherosclerosis Mobile cardiac telemetry 12 days 04/08/2021 - 04/21/2021: Dominant rhythm: Sinus. HR 50-122 bpm. Avg HR 74 bpm, in sinus rhythm. 64 episodes of probable atrial tachycardia, fastest at 176 bpm for 18 beats, longest for 9.6 secs at 138 bpm. <1% isolated SVE, couplet/triplets. 0 episodes of VT. <1% isolated VE, couplet, no triplets. No atrial fibrillation/atrial flutter/VT/high grade AV block, sinus pause >3sec noted. 0 patient triggered events.     Recent labs: 06/16/2022: Glucose 98, BUN/Cr 15/0.72. EGFR 79. Na/K 139/4.1. Rest of the CMP normal H/H 13/39. MCV 92. Platelets 292 TSH 0.6 normal  10/30/2020: Glucose 104, BUN/Cr 17/0.78. EGFR 78. Na/K 14/4.5. Rest of the CMP normal H/H 14/40. MCV 93. Platelets 240 HbA1C NA Chol 155, TG 142, HDL 61, LDL 70  TSH 1.4 normal    Review of Systems  Constitutional: Positive for malaise/fatigue.  Cardiovascular:  Negative for chest pain, dyspnea on exertion, leg swelling, palpitations and syncope.  Gastrointestinal:  Positive for bloating and diarrhea.         Vitals:   10/12/22 1351  BP: 131/77  Pulse: 63  Resp: 16  SpO2: 97%      Body mass index is 30.04 kg/m. Filed Weights   10/12/22 1351  Weight: 175 lb (79.4 kg)      Objective:   Physical Exam Vitals and nursing note reviewed.  Constitutional:      General: She is not in acute distress. Neck:     Vascular: No JVD.   Cardiovascular:     Rate and Rhythm: Normal rate and regular rhythm.     Heart sounds: Normal heart sounds. No murmur heard. Pulmonary:     Effort: Pulmonary effort is normal.     Breath sounds: Normal breath sounds. No wheezing or rales.  Musculoskeletal:     Right lower leg: No edema.     Left lower leg: No edema.         Visit diagnoses:   ICD-10-CM   1. Nonrheumatic tricuspid valve regurgitation  I36.1 ECHOCARDIOGRAM COMPLETE        Orders Placed This Encounter  Procedures   ECHOCARDIOGRAM COMPLETE      Assessment & Recommendations:    77 year old Caucasian female with  hyperlipidemia, chronic cough, hiatal hernia, h/o perioperative DVT/PE (03/2022).  Biatrial enlargement: Constellation of her echocardiographic findings do raise suspicion of PAF. Given that her LV geometry and appearance is normal, other causes of biatrial enlargement (such as amyloidosis) are less likely.  She has had two separate outpatient cardiac telemetry without noted Afib. Regardless, patient is now on Eliquis 5 mg bid since her seemingly provoked perioperative DVT/PE in 03/2022. In absence of any bleeding issues, I do think the benefits of anticoagulation outweighs the risks. Therefore, we mutually have decided to continue Eliquis 5 mg bid.  Mod MR, TR: Clinically asymptomatic. Repeat echocardiogram in 06/2022.  F/u in 3 months   Elder Negus, MD Pager: [phone] Office: [phone]

## 2022-10-14 ENCOUNTER — Ambulatory Visit: Payer: Medicare Other | Attending: Internal Medicine | Admitting: Physical Therapy

## 2022-10-14 DIAGNOSIS — R293 Abnormal posture: Secondary | ICD-10-CM

## 2022-10-14 DIAGNOSIS — M6281 Muscle weakness (generalized): Secondary | ICD-10-CM | POA: Diagnosis not present

## 2022-10-14 DIAGNOSIS — R279 Unspecified lack of coordination: Secondary | ICD-10-CM

## 2022-10-14 NOTE — Addendum Note (Signed)
Addended by: Barbaraann Faster on: 10/14/2022 03:15 PM   Modules accepted: Orders

## 2022-10-14 NOTE — Therapy (Addendum)
OUTPATIENT PHYSICAL THERAPY FEMALE PELVIC Treatment   Patient Name: [name] MRN: [id number] DOB:May 30, 1945, 77 y.o., female Today's Date: 10/14/2022  END OF SESSION:  PT End of Session - 10/14/22 1151     Visit Number 7    Date for PT Re-Evaluation 10/13/22    Authorization Type mcare    PT Start Time 1150   arrival   PT Stop Time 1230    PT Time Calculation (min) 40 min    Activity Tolerance Patient tolerated treatment well    Behavior During Therapy WFL for tasks assessed/performed                 Past Medical History:  Diagnosis Date   Anemia    Ascending aorta dilatation (HCC)    Atrial dilatation    Bartholin cyst    Compression fracture    lumbar    Constipation    Disorder of bone density and structure, unspecified    Fatty liver    Hearing loss    History of colon polyps    History of hiatal hernia    History of SCC (squamous cell carcinoma) of skin    Hypertension    Liver lesion    Mild cardiomegaly    Mitral regurgitation    Multinodular goiter    Obesity    Other iron deficiency anemia    Primary osteoarthritis    Pulmonary hypertension (HCC)    Pure hypercholesterolemia    Scoliosis    Sensory - neural hearing loss    right   Tremor    Tricuspid regurgitation    Vitamin D deficiency    Past Surgical History:  Procedure Laterality Date   APPENDECTOMY     BONE ANCHORED HEARING AID IMPLANT Right 10/10/2014   Procedure: BONE ANCHORED HEARING AID (BAHA) IMPLANT TEMPORAL BONE RIGHT EAR;  Surgeon: Ermalinda Barrios, MD;  Location: [facility];  Service: ENT;  Laterality: Right;   BREAST REDUCTION SURGERY     CARPAL TUNNEL RELEASE Right    CATARACT EXTRACTION Bilateral    ROTATOR CUFF REPAIR Right    STAPEDECTOMY Bilateral    has metal implants (1 in each ear) - pt cannot have a MRI of her head/brain   UMBILICAL HERNIA REPAIR     XI ROBOTIC ASSISTED HIATAL HERNIA REPAIR N/A 03/03/2022   Procedure: XI ROBOTIC ASSISTED HIATAL  HERNIA REPAIR WITH MESH AND FUNDOPLICATION;  Surgeon: Axel Filler, MD;  Location: [facility];  Service: General;  Laterality: N/A;   Patient Active Problem List   Diagnosis Date Noted   Irritable bowel syndrome with diarrhea 07/30/2022   Pelvic floor dysfunction 07/30/2022   Gas bloat syndrome 06/16/2022   Full incontinence of feces 06/16/2022   Decreased anal sphincter tone 06/16/2022   DVT (deep venous thrombosis) (HCC) 03/05/2022   Acute pulmonary embolism (HCC) 03/05/2022   S/P Nissen fundoplication (without gastrostomy tube) procedure 03/03/2022   Nonrheumatic mitral valve regurgitation 11/12/2021   Nonrheumatic tricuspid valve regurgitation 11/12/2021   Palpitations 11/12/2021   Atrial dilatation 05/20/2021   Constipation 05/20/2021   Hearing loss 05/20/2021   Hiatal hernia 05/20/2021   Iron deficiency anemia 05/20/2021   Lesion of liver 05/20/2021   Obesity 05/20/2021   Osteochondropathy 05/20/2021   Personal history of colonic polyps 05/20/2021   Primary osteoarthritis 05/20/2021   Pulmonary hypertension (HCC) 05/20/2021   Pure hypercholesterolemia 05/20/2021   Vitamin D deficiency 05/20/2021   Laryngopharyngeal reflux (LPR) 05/09/2021   Chronic cough 05/08/2021  Family history of thyroid cancer 05/08/2021   Ascending aorta dilatation (HCC) 04/09/2021   Abnormal echocardiogram 04/09/2021   Biatrial enlargement 04/09/2021   Mild tricuspid regurgitation 04/09/2021    PCP: Soundra Pilon, FNP  REFERRING PROVIDER: Iva Boop, MD   REFERRING DIAG: R15.9 (ICD-10-CM) - Full incontinence of feces   THERAPY DIAG:  Muscle weakness (generalized)  Unspecified lack of coordination  Abnormal posture  Rationale for Evaluation and Treatment: Rehabilitation  ONSET DATE: January 30,2024  SUBJECTIVE:                                                                                                                                                                                            SUBJECTIVE STATEMENT: Reports she has inconsistent stool and urine leakage but much lower amounts/less often. With stool its only after a bowel movement and thinks she has a little residual stool and only if she has a triggering food in diet. With controlled diet has no leakage. Urine is less as well and only noting a little dampness on pad   Fluid intake: Yes: about 60 oz water/green tea    PAIN:  Are you having pain? No   PRECAUTIONS: None  WEIGHT BEARING RESTRICTIONS: No  FALLS:  Has patient fallen in last 6 months? No  LIVING ENVIRONMENT: Lives with: lives with their spouse Lives in: House/apartment   OCCUPATION:   PLOF: Independent  PATIENT GOALS: get bowel movements under control- get back to 1-2/day and not straining  PERTINENT HISTORY:  Hiatal hernia repair 02/2022; 2 vaginal deliveries (10lb baby), umbilical hernia repair Sexual abuse: No  BOWEL MOVEMENT: Pain with bowel movement: Yes have blood at times Type of bowel movement:Type (Bristol Stool Scale) water to constipation, Frequency daily unless taking imodium, and Strain No Fully empty rectum: Yes: sometimes not Leakage: Yes: with gas can be pasty Pads: Yes: at least 5 Fiber supplement: Yes: benefiber  URINATION: Pain with urination: No Fully empty bladder: Yes:   Stream:  Urgency: No Frequency: normal Leakage:  no Pads: No  INTERCOURSE:   PREGNANCY: Vaginal deliveries 2 Tearing Yes: 10 lb baby   PROLAPSE: Nothing of note   OBJECTIVE:   DIAGNOSTIC FINDINGS:  Colonoscopy 1 year ago (saw the hernia initially)   PATIENT SURVEYS:    PFIQ-7   COGNITION: Overall cognitive status: Within functional limits for tasks assessed     SENSATION: Light touch:  Proprioception: Appears intact  MUSCLE LENGTH: Hamstrings: Right 80 deg; Left 90 deg Thomas test:  LUMBAR SPECIAL TESTS:  Straight leg raise test: Negative  FUNCTIONAL TESTS:  Single leg stand normal some  instability uses  UE initially  GAIT:  Comments: WFL   POSTURE: rounded shoulders, forward head, increased lumbar lordosis, and decreased thoracic kyphosis  PELVIC ALIGNMENT: upslip on left side  LUMBARAROM/PROM:  A/PROM A/PROM  eval  Flexion 75%  Extension   Right lateral flexion WFL   Left lateral flexion WFL   Right rotation 75%  Left rotation 75%   (Blank rows = not tested)  LOWER EXTREMITY ROM:  Passive ROM Right eval Left eval  Hip flexion [facility]   Hip extension    Hip abduction    Hip adduction    Hip internal rotation [facility]  WFL   Hip external rotation 75% WFL   Knee flexion    Knee extension    Ankle dorsiflexion    Ankle plantarflexion    Ankle inversion    Ankle eversion     (Blank rows = not tested)  LOWER EXTREMITY MMT:  MMT Right eval Left eval  Hip flexion    Hip extension    Hip abduction    Hip adduction    Hip internal rotation    Hip external rotation    Knee flexion    Knee extension    Ankle dorsiflexion    Ankle plantarflexion    Ankle inversion    Ankle eversion     PALPATION:   General  scar tissue well healed across left upper quadrant                External Perineal Exam anal wink present on Rt side                             Internal Pelvic Floor minimal excursion or drawing in, did better when cued to try not to pee, unable to coordinate posterior pelvic floor and bulges and bearing down with every other attempt to tighten  Patient confirms identification and approves PT to assess internal pelvic floor and treatment Yes  PELVIC MMT:   MMT eval 10/14/22   Vaginal  2/5  Internal Anal Sphincter 1/5   External Anal Sphincter 1/5   Puborectalis 1/5   Diastasis Recti    (Blank rows = not tested)        TONE: normal  PROLAPSE: no  TODAY'S TREATMENT:                                                                                                                              DATE:  10/14/22: Patient consented  to internal pelvic floor treatment/reassessment vaginally this date and found to have decreased strength, endurance, and coordination. Pt did have moderate amount of stool present in pad and reports having type 6 stool present when wiping to clean as well that she was unaware of, after pt cleaned with privacy PT returned and pt consented to internal pelvic floor treatment.  Pt noted to have fecal matter present throughout perineal area and at  medial proximal adductors. PT educated pt on this, provided more wipes and pt cleaned area fully and was unaware of stool being present.  Once area cleaned and hygeinic to prevent fecal matter present for vaginal internal work, PT proceeded with internal digital placement. Pt found to have increased strength compared to rectal assessment with eval (different PT completed eval). Pt denied pain, does still have weakness present and educated on this. After reassessment, PT stepped out for pt to dress with privacy.   PATIENT EDUCATION:  Education details: Access Code: [email or web address] Person educated: Patient Education method: Explanation, Demonstration, Tactile cues, Verbal cues, and Handouts Education comprehension: verbalized understanding and returned demonstration  HOME EXERCISE PROGRAM: Access Code: [email or web address] URL: [email or web address] Date: 07/21/2022 Prepared by: Dwana Curd  Exercises - Supine Diaphragmatic Breathing  - 3 x daily - 7 x weekly - 1 sets - 10 reps - Hooklying Transversus Abdominis Palpation  - 1 x daily - 7 x weekly - 3 sets - 10 reps - Seated Thoracic Flexion and Rotation with Arms Crossed  - 1 x daily - 7 x weekly - 1 sets - 10 reps - 5 sec hold - Seated Thoracic Lumbar Extension  - 1 x daily - 7 x weekly - 1 sets - 10 reps - 5 sec hold  ASSESSMENT:  CLINICAL IMPRESSION: Pt reported improved with symptoms of greater than 50% and does still have inconsistent fecal leakage. Has had weeks without but does have  consistently no leakage. Leakage present today and pt reports if she has a lot of errands to run or on her feet more she may have more leakage like today. Pt has follow up with MD next month and plans to ask him for additional recommendations and is going out of the country for 2 months. At this time pt would like to DC PT and return once back from her trip. This will serve as DC.   OBJECTIVE IMPAIRMENTS: decreased coordination, decreased endurance, decreased ROM, decreased strength, increased muscle spasms, impaired flexibility, and postural dysfunction.   ACTIVITY LIMITATIONS: continence, toileting, and exericse  PARTICIPATION LIMITATIONS: interpersonal relationship and community activity  PERSONAL FACTORS: 3+ comorbidities: Hiatal hernia repair 02/2022; 2 vaginal deliveries (10lb baby), umbilical hernia repair  are also affecting patient's functional outcome.   REHAB POTENTIAL: Excellent  CLINICAL DECISION MAKING: Evolving/moderate complexity  EVALUATION COMPLEXITY: Moderate   GOALS: Goals reviewed with patient? Yes  SHORT TERM GOALS: Target date: 08/18/22  Ind with initial HEP Baseline: Goal status: MET   LONG TERM GOALS: Target date: 10/13/22 Updated 08/20/22  Pt will be independent with advanced HEP to maintain improvements made throughout therapy  Baseline:  Goal status: MET  2.  Pt will report her BMs are complete due to improved bowel habits and evacuation techniques.  Baseline: still having some leakage after Goal status: MET  3.  Pt will be able to functional actions and not have leakage for 1 week Baseline: better - no diarrhea Goal status:MET has had a week but not a month  4.  Pt will notice 50% less gas buildup due to more complete bowel movements. Baseline: better Goal status:MET   PLAN:  PT FREQUENCY: 1-2x/week  PT DURATION: 12 weeks  PLANNED INTERVENTIONS: Therapeutic exercises, Therapeutic activity, Neuromuscular re-education, Balance training, Gait  training, Patient/Family education, Self Care, Joint mobilization, Dry Needling, Electrical stimulation, Cryotherapy, Moist heat, Taping, Biofeedback, Manual therapy, and Re-evaluation  PLAN FOR NEXT SESSION: continue working on anal sphincter strength and coordination tactile cues/RUSI  PHYSICAL THERAPY DISCHARGE  SUMMARY  Visits from Start of Care: 7  Current functional level related to goals / functional outcomes: Has met all goals however does still have some fecal leakage   Remaining deficits: Pt does still have inconsistent fecal leakage without awareness of occurrence inconsistently      Education / Equipment: HEP   Patient agrees to discharge. Patient goals were met. Patient is being discharged due to meeting the stated rehab goals.   Otelia Sergeant, PT, DPT 09/11/243:14 PM

## 2022-10-15 DIAGNOSIS — D1801 Hemangioma of skin and subcutaneous tissue: Secondary | ICD-10-CM | POA: Diagnosis not present

## 2022-10-15 DIAGNOSIS — C44529 Squamous cell carcinoma of skin of other part of trunk: Secondary | ICD-10-CM | POA: Diagnosis not present

## 2022-10-15 DIAGNOSIS — L821 Other seborrheic keratosis: Secondary | ICD-10-CM | POA: Diagnosis not present

## 2022-10-15 DIAGNOSIS — L570 Actinic keratosis: Secondary | ICD-10-CM | POA: Diagnosis not present

## 2022-10-15 DIAGNOSIS — Z85828 Personal history of other malignant neoplasm of skin: Secondary | ICD-10-CM | POA: Diagnosis not present

## 2022-10-15 DIAGNOSIS — D485 Neoplasm of uncertain behavior of skin: Secondary | ICD-10-CM | POA: Diagnosis not present

## 2022-10-19 ENCOUNTER — Ambulatory Visit: Payer: Medicare Other | Admitting: Cardiology

## 2022-10-19 ENCOUNTER — Telehealth: Payer: Self-pay | Admitting: Internal Medicine

## 2022-10-19 NOTE — Telephone Encounter (Signed)
Inbound call from patient requesting to speak with PJ regarding SIBO test. States she has questions regarding how to proceed with test. Please advise, thank you.

## 2022-10-23 ENCOUNTER — Ambulatory Visit: Payer: Medicare Other | Admitting: Internal Medicine

## 2022-10-28 NOTE — Telephone Encounter (Signed)
PT returning call with more questions pertaining to the SIBO test. Please advise.

## 2022-11-11 ENCOUNTER — Ambulatory Visit (INDEPENDENT_AMBULATORY_CARE_PROVIDER_SITE_OTHER): Payer: Medicare Other | Admitting: Internal Medicine

## 2022-11-11 ENCOUNTER — Encounter: Payer: Self-pay | Admitting: Internal Medicine

## 2022-11-11 VITALS — BP 114/62 | HR 64 | Ht 62.5 in | Wt 178.0 lb

## 2022-11-11 DIAGNOSIS — R194 Change in bowel habit: Secondary | ICD-10-CM

## 2022-11-11 DIAGNOSIS — K9289 Other specified diseases of the digestive system: Secondary | ICD-10-CM | POA: Diagnosis not present

## 2022-11-11 DIAGNOSIS — M6289 Other specified disorders of muscle: Secondary | ICD-10-CM | POA: Diagnosis not present

## 2022-11-11 DIAGNOSIS — K580 Irritable bowel syndrome with diarrhea: Secondary | ICD-10-CM | POA: Diagnosis not present

## 2022-11-11 DIAGNOSIS — R159 Full incontinence of feces: Secondary | ICD-10-CM | POA: Diagnosis not present

## 2022-11-11 DIAGNOSIS — K6289 Other specified diseases of anus and rectum: Secondary | ICD-10-CM

## 2022-11-11 DIAGNOSIS — Z9889 Other specified postprocedural states: Secondary | ICD-10-CM | POA: Diagnosis not present

## 2022-11-11 NOTE — Progress Notes (Signed)
[name] 77 y.o. 1945-11-05 [id number]  Assessment & Plan:   Encounter Diagnoses  Name Primary?   Gas bloat syndrome Yes   Pelvic floor dysfunction    Altered bowel habits    Decreased anal sphincter tone    S/P laparoscopic Toupet fundoplication    Irritable bowel syndrome with diarrhea    Full incontinence of feces     Persistent intermittent diarrhea, gas, and bloating. Possible small intestinal bacterial overgrowth (SIBO) contributing to symptoms. -Complete SIBO test for further evaluation.  Mild fecal incontinence associated with loose stools. Improvement noted with firmer stool consistency. -Consider resuming Benefiber to help bulk up stools. -Continue use of Imodium as needed for diarrhea control.  Limited diet due to food sensitivities and fear of triggering diarrhea. Working with a nutritionist for dietary modifications. -Continue working with nutritionist to gradually introduce new foods and manage food sensitivities.          Subjective:  Gastroenterology summary:  Jun 16, 2022-first visit Dr. Karin [name] bloat syndrome after laparoscopic toupee fundoplication January 2024, altered bowel habits with suspected pelvic floor dysfunction decreased anal sphincter tone and full incontinence of feces.  [name] TSH fecal calprotectin all normal April and May 2024  Previous endoscopic evaluations, former patient of Dr. Kinnie [name]:   Colonoscopy 05/01/2021 3 polyps in the ascending colon each were 6 mm, sigmoid and descending diverticulosis.  He did have a history of polyps in the past as well.  Polyps were tubular adenomas and lymphoid tissue   EGD 2014 healed gastric ulcer (NSAID induced) 3 cm hiatal hernia described  July 08, 2022 gastric emptying study-slightly delayed emptying at hours 3 and 4  CT abdomen and pelvis 07/28/2022-hepatic and ovarian cysts (old), diverticulosis, otherwise negative.  Pelvic floor PT initiated June  2024-has helped  07/2022 F/U [name] tried +/- results  Hyoscyamine +/- help  Lactulose hydrogen breath test recommended  Seeing dietitian [name], RD  -----------------------------------------------------------------------------------------  Discussed the use of AI scribe software for clinical note transcription with the patient, who gave verbal consent to proceed.   Chief Complaint: Diarrhea with gas and bloating still though improved  HPI 77 year old white woman with a history of gas bloat syndrome and IBS type problems as outlined above, most of these problems were not an issue or at least not severe until she had fundoplication surgery at the beginning of this year, she says.  Other medical problems who DVT/PE related to fundoplication on Eliquis.  The patient reports persistent intermittent diarrhea, gas, and bloating. Despite dietary modifications, including a breakfast of bananas, toast, and applesauce, she experiences episodes of diarrhea when introducing new foods. She has not yet taken the SIBO test due to logistical concerns and recent travel.  The patient also reports less frequent fecal incontinence, with two to three bowel movements a day of soft consistency. She has stopped taking Benefiber, with no noticeable change in symptoms. The patient has found some relief from a product called "ultimate gut restore," taken daily, which she believes to be a probiotic.  She has tried Imodium for severe diarrhea episodes, taking up to three or four tablets a day as needed but not frequently. The patient reports one recent episode of diarrhea after consuming green grapes. She has been working with a dietician and undergoing pelvic floor therapy, which has been somewhat helpful. The patient expresses concern about potential damage to the intestinal lining and is seeking further testing and treatment options.  Expand All Collapse All  OUTPATIENT PHYSICAL THERAPY  FEMALE PELVIC Treatment     Patient Name: [name] MRN: [id number] DOB:08-16-1945, 77 y.o., female Today's Date: 10/14/2022                                                                                                                                                          SUBJECTIVE STATEMENT: Reports she has inconsistent stool and urine leakage but much lower amounts/less often. With stool its only after a bowel movement and thinks she has a little residual stool and only if she has a triggering food in diet. With controlled diet has no leakage. Urine is less as well and only noting a little dampness on pad           No Known Allergies Current Meds  Medication Sig   acetaminophen (TYLENOL) 500 MG tablet Take 1,000 mg by mouth every 8 (eight) hours as needed (pain.).   AMBULATORY NON FORMULARY MEDICATION Melatonin patch 6 mg with magnesium Apply 1 patch transdermal nightly   apixaban (ELIQUIS) 5 MG TABS tablet Take 1 tablet by mouth twice daily.   Brimonidine Tartrate (LUMIFY) 0.025 % SOLN Place 1 drop into both eyes 2 (two) times daily as needed (dry/irritated eyes.).   carboxymethylcellulose (REFRESH PLUS) 0.5 % SOLN Place 1 drop into both eyes 3 (three) times daily as needed (dry/irritated eyes.).   cetirizine (ZYRTEC) 10 MG tablet Take 10 mg by mouth daily as needed (seasonal allergies.).   Cholecalciferol (VITAMIN D) 50 MCG (2000 UT) CAPS Take 7,000 Units by mouth daily.   COLLAGEN PO Take 2 Scoops by mouth daily. Mix with coffee (Collagen Peptides)   fluorouracil (EFUDEX) 5 % cream Apply 1 Application topically 2 (two) times daily as needed (skin lesions).   hyoscyamine (LEVSIN SL) 0.125 MG SL tablet DISSOLVE 1 TABLET(0.125 MG) UNDER THE TONGUE EVERY 4 HOURS AS NEEDED FOR CRAMPING OR GAS OR BLOATING   metoprolol succinate (TOPROL-XL) 25 MG 24 hr tablet Take 1 tablet (25 mg total) by mouth at bedtime.   METRONIDAZOLE, TOPICAL, 0.75 % LOTN Apply 1 Application topically  daily as needed (rosacea).   Multiple Vitamin (MULTIVITAMIN) capsule Take 1 capsule by mouth daily.   OVER THE COUNTER MEDICATION OTC: Gut Restore Ultimate Sig: 2 capsules every AM   rosuvastatin (CRESTOR) 10 MG tablet Take 10 mg by mouth at bedtime.   saccharomyces boulardii (FLORASTOR) 250 MG capsule Take 250 mg by mouth 2 (two) times daily.   tretinoin (RETIN-A) 0.05 % cream Apply 1 application  topically daily as needed (rosacea.).   Past Medical History:  Diagnosis Date   Anemia    Ascending aorta dilatation (HCC)    Atrial dilatation    Bartholin cyst    Compression fracture    lumbar  Constipation    Disorder of bone density and structure, unspecified    Fatty liver    Gas bloat syndrome    Hearing loss    History of colon polyps    History of hiatal hernia    History of SCC (squamous cell carcinoma) of skin    Hypertension    Liver lesion    Mild cardiomegaly    Mitral regurgitation    Multinodular goiter    Obesity    Other iron deficiency anemia    Primary osteoarthritis    Pulmonary hypertension (HCC)    Pure hypercholesterolemia    Scoliosis    Sensory - neural hearing loss    right   Tremor    Tricuspid regurgitation    Vitamin D deficiency    Past Surgical History:  Procedure Laterality Date   APPENDECTOMY     BONE ANCHORED HEARING AID IMPLANT Right 10/10/2014   Procedure: BONE ANCHORED HEARING AID (BAHA) IMPLANT TEMPORAL BONE RIGHT EAR;  Surgeon: Ermalinda Barrios, MD;  Location: [facility];  Service: ENT;  Laterality: Right;   BREAST REDUCTION SURGERY     CARPAL TUNNEL RELEASE Right    CATARACT EXTRACTION Bilateral    ROTATOR CUFF REPAIR Right    STAPEDECTOMY Bilateral    has metal implants (1 in each ear) - pt cannot have a MRI of her head/brain   UMBILICAL HERNIA REPAIR     XI ROBOTIC ASSISTED HIATAL HERNIA REPAIR N/A 03/03/2022   Procedure: XI ROBOTIC ASSISTED HIATAL HERNIA REPAIR WITH MESH AND FUNDOPLICATION;  Surgeon: Axel Filler, MD;  Location: [facility] OR;  Service: General;  Laterality: N/A;   Social History   Social History Narrative   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   Review of Systems As per HPI  Objective:   Physical Exam BP 114/62 (BP Location: Left Arm, Patient Position: Sitting, Cuff Size: Normal)   Pulse 64   Ht 5' 2.5" (1.588 m) Comment: height measured without shoes  Wt 178 lb (80.7 kg)   BMI 32.04 kg/m   35 minutes total time

## 2022-11-11 NOTE — Patient Instructions (Addendum)
VISIT SUMMARY:  During your visit, we discussed your ongoing gastrointestinal issues, including persistent diarrhea, gas, bloating, and fecal incontinence. We also talked about your dietary modifications and the treatments you've been trying, such as the 'ultimate gut restore' product and Imodium. .  INSTRUCTIONS:  Please complete the SIBO test as soon as possible. If your symptoms persist or if the test is positive, we will plan treatment with antibiotics. Continue using Imodium as needed for diarrhea control. Continue to work with your nutritionist to gradually introduce new foods and manage your food sensitivities.  I appreciate the opportunity to care for you. Stan Head, MD, [facility]

## 2022-11-16 DIAGNOSIS — R140 Abdominal distension (gaseous): Secondary | ICD-10-CM | POA: Diagnosis not present

## 2022-11-16 DIAGNOSIS — R197 Diarrhea, unspecified: Secondary | ICD-10-CM | POA: Diagnosis not present

## 2022-11-17 DIAGNOSIS — E559 Vitamin D deficiency, unspecified: Secondary | ICD-10-CM | POA: Diagnosis not present

## 2022-11-17 DIAGNOSIS — I270 Primary pulmonary hypertension: Secondary | ICD-10-CM | POA: Diagnosis not present

## 2022-11-17 DIAGNOSIS — E669 Obesity, unspecified: Secondary | ICD-10-CM | POA: Diagnosis not present

## 2022-11-17 DIAGNOSIS — K760 Fatty (change of) liver, not elsewhere classified: Secondary | ICD-10-CM | POA: Diagnosis not present

## 2022-11-17 DIAGNOSIS — R197 Diarrhea, unspecified: Secondary | ICD-10-CM | POA: Diagnosis not present

## 2022-11-17 DIAGNOSIS — E7800 Pure hypercholesterolemia, unspecified: Secondary | ICD-10-CM | POA: Diagnosis not present

## 2022-11-17 DIAGNOSIS — R7309 Other abnormal glucose: Secondary | ICD-10-CM | POA: Diagnosis not present

## 2022-11-17 DIAGNOSIS — M8580 Other specified disorders of bone density and structure, unspecified site: Secondary | ICD-10-CM | POA: Diagnosis not present

## 2022-11-17 DIAGNOSIS — Z0000 Encounter for general adult medical examination without abnormal findings: Secondary | ICD-10-CM | POA: Diagnosis not present

## 2022-11-17 DIAGNOSIS — Z91018 Allergy to other foods: Secondary | ICD-10-CM | POA: Diagnosis not present

## 2022-11-17 DIAGNOSIS — D508 Other iron deficiency anemias: Secondary | ICD-10-CM | POA: Diagnosis not present

## 2022-11-18 ENCOUNTER — Telehealth: Payer: Self-pay | Admitting: Internal Medicine

## 2022-11-18 DIAGNOSIS — M8588 Other specified disorders of bone density and structure, other site: Secondary | ICD-10-CM | POA: Diagnosis not present

## 2022-11-18 DIAGNOSIS — Z1231 Encounter for screening mammogram for malignant neoplasm of breast: Secondary | ICD-10-CM | POA: Diagnosis not present

## 2022-11-18 NOTE — Telephone Encounter (Signed)
We will look for the results to come.

## 2022-11-18 NOTE — Telephone Encounter (Signed)
Inbound call from patient stating that she  took the SIBO test Monday morning and sent it off on Monday afternoon. Patient stated that it should arrive at the lab late this afternoon.

## 2022-11-20 DIAGNOSIS — K760 Fatty (change of) liver, not elsewhere classified: Secondary | ICD-10-CM | POA: Diagnosis not present

## 2022-11-20 DIAGNOSIS — M8580 Other specified disorders of bone density and structure, unspecified site: Secondary | ICD-10-CM | POA: Diagnosis not present

## 2022-11-20 DIAGNOSIS — E7800 Pure hypercholesterolemia, unspecified: Secondary | ICD-10-CM | POA: Diagnosis not present

## 2022-11-20 DIAGNOSIS — Z0000 Encounter for general adult medical examination without abnormal findings: Secondary | ICD-10-CM | POA: Diagnosis not present

## 2022-11-20 DIAGNOSIS — I270 Primary pulmonary hypertension: Secondary | ICD-10-CM | POA: Diagnosis not present

## 2022-11-20 DIAGNOSIS — Z91018 Allergy to other foods: Secondary | ICD-10-CM | POA: Diagnosis not present

## 2022-11-20 DIAGNOSIS — R7309 Other abnormal glucose: Secondary | ICD-10-CM | POA: Diagnosis not present

## 2022-11-20 DIAGNOSIS — E559 Vitamin D deficiency, unspecified: Secondary | ICD-10-CM | POA: Diagnosis not present

## 2022-11-20 DIAGNOSIS — E669 Obesity, unspecified: Secondary | ICD-10-CM | POA: Diagnosis not present

## 2022-11-20 DIAGNOSIS — D508 Other iron deficiency anemias: Secondary | ICD-10-CM | POA: Diagnosis not present

## 2022-11-20 DIAGNOSIS — R197 Diarrhea, unspecified: Secondary | ICD-10-CM | POA: Diagnosis not present

## 2022-11-23 NOTE — Telephone Encounter (Signed)
Left [facility] a voice mail message to check on the status of her test.

## 2022-11-23 NOTE — Telephone Encounter (Signed)
[name] Hidden from [facility] returned your call. [phone]

## 2022-11-23 NOTE — Telephone Encounter (Signed)
I left [facility] a voice mail message that as soon as we get the results we will be in touch.

## 2022-11-23 NOTE — Telephone Encounter (Signed)
Inbound call from patient, following up on request below.

## 2022-11-24 DIAGNOSIS — Z23 Encounter for immunization: Secondary | ICD-10-CM | POA: Diagnosis not present

## 2022-11-24 MED ORDER — METRONIDAZOLE 250 MG PO TABS: 250.0000 mg | ORAL_TABLET | Freq: Three times a day (TID) | ORAL | 0 refills | Status: DC

## 2022-11-24 NOTE — Telephone Encounter (Signed)
I spoke with Jillyn Hidden at [facility] and he said he sent the results to Dr Izola Price email. I will let Dr Leone Payor know. She is going out of town Thursday. Currently she is having 2-3 BM's a day. Post SIBO testing she had a 2 hour episode of diarrhea. Jillyn Hidden said feel free to call him if any questions about the results.

## 2022-11-24 NOTE — Telephone Encounter (Signed)
Inbound call from patient requesting to speak with PJ. Please advise.

## 2022-11-24 NOTE — Telephone Encounter (Signed)
I left [name] at [facility] a message to call me back.

## 2022-11-24 NOTE — Addendum Note (Signed)
Addended by: Iva Boop on: 11/24/2022 05:22 PM   Modules accepted: Orders

## 2022-11-24 NOTE — Telephone Encounter (Signed)
SIBO test is equivocal.  I will try some metronidazole.  She does say after she took the lactulose and had diarrhea she feels somewhat better and she is introducing some new foods without major issues.  Still with some bloating and gassiness and would like to try the antibiotics.

## 2022-12-09 ENCOUNTER — Telehealth: Payer: Self-pay | Admitting: Internal Medicine

## 2022-12-09 NOTE — Telephone Encounter (Signed)
Patient called regarding the Antibiotics she is taking and would like to let Dr. Leone Payor know how its doing. Please advise

## 2022-12-09 NOTE — Telephone Encounter (Signed)
Patient called to update Dr. Leone Payor that 5 days into course of antibiotics she felt perfect. She completed the full course, and no longer having any symptoms. She's been eating about what she wants with no issues. Still working with a nutritionist. Introduced dairy with no issues. She will be traveling to [place] for all of December & January, and would like to know if she should have a refill for in case symptoms return since she will not be able to contact our office and if there is anything she should be on alert for in regards to symptoms returning.

## 2022-12-10 MED ORDER — METRONIDAZOLE 250 MG PO TABS: 250.0000 mg | ORAL_TABLET | Freq: Three times a day (TID) | ORAL | 0 refills | Status: DC

## 2022-12-10 NOTE — Telephone Encounter (Signed)
Spoke with patient & made her aware of MD recommendations.

## 2022-12-10 NOTE — Telephone Encounter (Signed)
I sent a refill into [facility]  She can retake with sig bloating, altered defecation like we did this time

## 2022-12-21 DIAGNOSIS — K130 Diseases of lips: Secondary | ICD-10-CM | POA: Diagnosis not present

## 2022-12-21 DIAGNOSIS — Z85828 Personal history of other malignant neoplasm of skin: Secondary | ICD-10-CM | POA: Diagnosis not present

## 2022-12-21 DIAGNOSIS — B370 Candidal stomatitis: Secondary | ICD-10-CM | POA: Diagnosis not present

## 2023-03-02 ENCOUNTER — Other Ambulatory Visit: Payer: Self-pay | Admitting: Cardiology

## 2023-03-02 DIAGNOSIS — I2699 Other pulmonary embolism without acute cor pulmonale: Secondary | ICD-10-CM

## 2023-03-02 NOTE — Telephone Encounter (Signed)
Eliquis 5mg  refill request received. Patient is 78 years old, weight-80.7kg, Crea-0.72 on 06/16/22, Diagnosis-dvt/pe, and last seen by Dr. Beaulah Dinning on 10/12/22. Dose is appropriate based on dosing criteria. Will send in refill to requested pharmacy.

## 2023-03-08 DIAGNOSIS — M545 Low back pain, unspecified: Secondary | ICD-10-CM | POA: Diagnosis not present

## 2023-03-08 DIAGNOSIS — M791 Myalgia, unspecified site: Secondary | ICD-10-CM | POA: Diagnosis not present

## 2023-03-24 DIAGNOSIS — L820 Inflamed seborrheic keratosis: Secondary | ICD-10-CM | POA: Diagnosis not present

## 2023-03-24 DIAGNOSIS — D0439 Carcinoma in situ of skin of other parts of face: Secondary | ICD-10-CM | POA: Diagnosis not present

## 2023-03-24 DIAGNOSIS — Z85828 Personal history of other malignant neoplasm of skin: Secondary | ICD-10-CM | POA: Diagnosis not present

## 2023-03-24 DIAGNOSIS — L570 Actinic keratosis: Secondary | ICD-10-CM | POA: Diagnosis not present

## 2023-03-29 DIAGNOSIS — H26493 Other secondary cataract, bilateral: Secondary | ICD-10-CM | POA: Diagnosis not present

## 2023-04-01 ENCOUNTER — Ambulatory Visit (HOSPITAL_BASED_OUTPATIENT_CLINIC_OR_DEPARTMENT_OTHER): Payer: Medicare Other | Admitting: Obstetrics & Gynecology

## 2023-04-01 ENCOUNTER — Encounter (HOSPITAL_BASED_OUTPATIENT_CLINIC_OR_DEPARTMENT_OTHER): Payer: Self-pay | Admitting: Obstetrics & Gynecology

## 2023-04-01 VITALS — BP 124/73 | HR 70 | Ht 62.5 in | Wt 188.8 lb

## 2023-04-01 DIAGNOSIS — R946 Abnormal results of thyroid function studies: Secondary | ICD-10-CM

## 2023-04-01 DIAGNOSIS — N3000 Acute cystitis without hematuria: Secondary | ICD-10-CM

## 2023-04-01 DIAGNOSIS — R3915 Urgency of urination: Secondary | ICD-10-CM

## 2023-04-01 LAB — POCT URINALYSIS DIPSTICK
Appearance: NORMAL
Bilirubin, UA: NEGATIVE
Glucose, UA: NEGATIVE
Ketones, UA: NEGATIVE
Nitrite, UA: NEGATIVE
Protein, UA: NEGATIVE
Spec Grav, UA: 1.01 (ref 1.010–1.025)
Urobilinogen, UA: 0.2 U/dL
pH, UA: 6 (ref 5.0–8.0)

## 2023-04-01 NOTE — Progress Notes (Signed)
 GYNECOLOGY  VISIT  CC:     HPI: 78 y.o. G43P2000 Married White or Caucasian female here for several concerns.  Reports she is having a lot more urgency and she is leaking urine.  Urine POCT shows WBCs and trace RBCs today.  Will proceed with urine culture.  She is comfortable waiting for culture before any treatment.  Having some issues with thyroid.  Had positive thyroid globulin antibody testing.  Will check thyroid panel today.  She is having more fatigue that has been on going but is worsening.  I saw her in the summer due to abdominal symptoms and concerns about ovarian cancer.  CT was obtained and did not show any worrisome gyn findings.  There was a small 1.8cm ovarian cyst present.  She did follow up with GI was ultimately treated for SIBO.  She is much better.    Was able to travel to [place] since I saw her last.  This was a good trip.  Denies vaginal bleeding.   Reports she did have a mammogram in 2024.  Will reach out to [facility] for copy.  Last one in EMR was from 10/2020.    Past Medical History:  Diagnosis Date   Anemia    Ascending aorta dilatation (HCC)    Atrial dilatation    Bartholin cyst    Compression fracture    lumbar    Constipation    Disorder of bone density and structure, unspecified    Fatty liver    Gas bloat syndrome    Hearing loss    History of colon polyps    History of hiatal hernia    History of SCC (squamous cell carcinoma) of skin    Hypertension    Liver lesion    Mild cardiomegaly    Mitral regurgitation    Multinodular goiter    Obesity    Other iron deficiency anemia    Primary osteoarthritis    Pulmonary hypertension (HCC)    Pure hypercholesterolemia    Scoliosis    Sensory - neural hearing loss    right   Tremor    Tricuspid regurgitation    Vitamin D deficiency     MEDS:   Current Outpatient Medications on File Prior to Visit  Medication Sig Dispense Refill   apixaban (ELIQUIS) 5 MG TABS tablet TAKE 1 TABLET TWICE A DAY 180  tablet 1   metoprolol succinate (TOPROL-XL) 25 MG 24 hr tablet Take 1 tablet (25 mg total) by mouth at bedtime. 90 tablet 3   rosuvastatin (CRESTOR) 10 MG tablet Take 10 mg by mouth at bedtime.     acetaminophen (TYLENOL) 500 MG tablet Take 1,000 mg by mouth every 8 (eight) hours as needed (pain.). (Patient not taking: Reported on 04/01/2023)     AMBULATORY NON FORMULARY MEDICATION Melatonin patch 6 mg with magnesium Apply 1 patch transdermal nightly (Patient not taking: Reported on 04/01/2023)     Brimonidine Tartrate (LUMIFY) 0.025 % SOLN Place 1 drop into both eyes 2 (two) times daily as needed (dry/irritated eyes.). (Patient not taking: Reported on 04/01/2023)     carboxymethylcellulose (REFRESH PLUS) 0.5 % SOLN Place 1 drop into both eyes 3 (three) times daily as needed (dry/irritated eyes.). (Patient not taking: Reported on 04/01/2023)     cetirizine (ZYRTEC) 10 MG tablet Take 10 mg by mouth daily as needed (seasonal allergies.). (Patient not taking: Reported on 04/01/2023)     Cholecalciferol (VITAMIN D) 50 MCG (2000 UT) CAPS Take 7,000 Units by  mouth daily. (Patient not taking: Reported on 04/01/2023)     COLLAGEN PO Take 2 Scoops by mouth daily. Mix with coffee (Collagen Peptides) (Patient not taking: Reported on 04/01/2023)     fluorouracil (EFUDEX) 5 % cream Apply 1 Application topically 2 (two) times daily as needed (skin lesions). (Patient not taking: Reported on 04/01/2023)     hyoscyamine (LEVSIN SL) 0.125 MG SL tablet DISSOLVE 1 TABLET(0.125 MG) UNDER THE TONGUE EVERY 4 HOURS AS NEEDED FOR CRAMPING OR GAS OR BLOATING (Patient not taking: Reported on 04/01/2023) 60 tablet 5   METRONIDAZOLE, TOPICAL, 0.75 % LOTN Apply 1 Application topically daily as needed (rosacea). (Patient not taking: Reported on 04/01/2023)     Multiple Vitamin (MULTIVITAMIN) capsule Take 1 capsule by mouth daily. (Patient not taking: Reported on 04/01/2023)     OVER THE COUNTER MEDICATION OTC: Gut Restore Ultimate Sig: 2  capsules every AM (Patient not taking: Reported on 04/01/2023)     saccharomyces boulardii (FLORASTOR) 250 MG capsule Take 250 mg by mouth 2 (two) times daily. (Patient not taking: Reported on 04/01/2023)     tretinoin (RETIN-A) 0.05 % cream Apply 1 application  topically daily as needed (rosacea.). (Patient not taking: Reported on 04/01/2023)     No current facility-administered medications on file prior to visit.    ALLERGIES: Patient has no known allergies.  SH:  married, non smoker  Review of Systems  Genitourinary: Negative.     PHYSICAL EXAMINATION:    BP 124/73 (BP Location: Left Arm, Patient Position: Sitting, Cuff Size: Normal)   Pulse 70   Ht 5' 2.5" (1.588 m)   Wt 188 lb 12.8 oz (85.6 kg)   BMI 33.98 kg/m     Physical Exam Constitutional:      Appearance: Normal appearance.  Neurological:     General: No focal deficit present.  Psychiatric:        Mood and Affect: Mood normal.      Assessment/Plan: 1. Urinary urgency (Primary) - Urine Culture - POCT urinalysis dipstick  2. Abnormal thyroid function test - Thyroid Panel With TSH

## 2023-04-02 ENCOUNTER — Encounter (HOSPITAL_BASED_OUTPATIENT_CLINIC_OR_DEPARTMENT_OTHER): Payer: Self-pay | Admitting: Obstetrics & Gynecology

## 2023-04-02 LAB — THYROID PANEL WITH TSH
Free Thyroxine Index: 1.5 (ref 1.2–4.9)
T3 Uptake Ratio: 27 % (ref 24–39)
T4, Total: 5.7 ug/dL (ref 4.5–12.0)
TSH: 0.795 u[IU]/mL (ref 0.450–4.500)

## 2023-04-05 LAB — URINE CULTURE

## 2023-04-06 ENCOUNTER — Other Ambulatory Visit (HOSPITAL_BASED_OUTPATIENT_CLINIC_OR_DEPARTMENT_OTHER): Payer: Self-pay | Admitting: *Deleted

## 2023-04-06 ENCOUNTER — Encounter (HOSPITAL_BASED_OUTPATIENT_CLINIC_OR_DEPARTMENT_OTHER): Payer: Self-pay | Admitting: *Deleted

## 2023-04-06 MED ORDER — NITROFURANTOIN MONOHYD MACRO 100 MG PO CAPS: 100.0000 mg | ORAL_CAPSULE | Freq: Two times a day (BID) | ORAL | 0 refills | Status: AC

## 2023-04-06 NOTE — Addendum Note (Signed)
 Addended by: Jerene Bears on: 04/06/2023 04:05 PM   Modules accepted: Orders

## 2023-04-08 ENCOUNTER — Ambulatory Visit (HOSPITAL_COMMUNITY): Payer: Medicare Other | Attending: Cardiology

## 2023-04-08 DIAGNOSIS — I361 Nonrheumatic tricuspid (valve) insufficiency: Secondary | ICD-10-CM | POA: Diagnosis not present

## 2023-04-08 LAB — ECHOCARDIOGRAM COMPLETE
Area-P 1/2: 3.46 cm2
S' Lateral: 2.6 cm

## 2023-04-12 ENCOUNTER — Telehealth: Payer: Self-pay | Admitting: Internal Medicine

## 2023-04-12 NOTE — Telephone Encounter (Signed)
 Spoke with the pt and discussed with her complaints of scratchy throat and some issues of trouble swallowing.  She was last seen Dr Leone Payor in October 2024. She had surgery for hernia about a year ago and states that she had some complications from the surgery. Appt advised and pt transferred to the schedulers to set up.

## 2023-04-12 NOTE — Telephone Encounter (Signed)
 Inbound call from patient requesting a call to speak about symptoms she is experiencing. Did not wish to provide further detail. Please advise, thank you.

## 2023-04-13 DIAGNOSIS — M25561 Pain in right knee: Secondary | ICD-10-CM | POA: Diagnosis not present

## 2023-04-13 DIAGNOSIS — M25562 Pain in left knee: Secondary | ICD-10-CM | POA: Diagnosis not present

## 2023-04-18 ENCOUNTER — Other Ambulatory Visit: Payer: Self-pay

## 2023-04-18 ENCOUNTER — Emergency Department (HOSPITAL_BASED_OUTPATIENT_CLINIC_OR_DEPARTMENT_OTHER)
Admission: EM | Admit: 2023-04-18 | Discharge: 2023-04-19 | Disposition: A | Discharge status: Home / Self Care | Attending: Emergency Medicine | Admitting: Emergency Medicine

## 2023-04-18 ENCOUNTER — Emergency Department (HOSPITAL_BASED_OUTPATIENT_CLINIC_OR_DEPARTMENT_OTHER)

## 2023-04-18 DIAGNOSIS — E860 Dehydration: Secondary | ICD-10-CM | POA: Insufficient documentation

## 2023-04-18 DIAGNOSIS — I10 Essential (primary) hypertension: Secondary | ICD-10-CM | POA: Insufficient documentation

## 2023-04-18 DIAGNOSIS — F4489 Other dissociative and conversion disorders: Secondary | ICD-10-CM

## 2023-04-18 DIAGNOSIS — I700 Atherosclerosis of aorta: Secondary | ICD-10-CM | POA: Diagnosis not present

## 2023-04-18 DIAGNOSIS — R410 Disorientation, unspecified: Secondary | ICD-10-CM | POA: Diagnosis not present

## 2023-04-18 DIAGNOSIS — R509 Fever, unspecified: Secondary | ICD-10-CM | POA: Insufficient documentation

## 2023-04-18 DIAGNOSIS — E871 Hypo-osmolality and hyponatremia: Secondary | ICD-10-CM | POA: Insufficient documentation

## 2023-04-18 DIAGNOSIS — I517 Cardiomegaly: Secondary | ICD-10-CM | POA: Diagnosis not present

## 2023-04-18 LAB — BASIC METABOLIC PANEL
Anion gap: 11 (ref 5–15)
BUN: 19 mg/dL (ref 8–23)
CO2: 21 mmol/L — ABNORMAL LOW (ref 22–32)
Calcium: 8.7 mg/dL — ABNORMAL LOW (ref 8.9–10.3)
Chloride: 99 mmol/L (ref 98–111)
Creatinine, Ser: 1.17 mg/dL — ABNORMAL HIGH (ref 0.44–1.00)
GFR, Estimated: 47 mL/min — ABNORMAL LOW (ref >60)
Glucose, Bld: 133 mg/dL — ABNORMAL HIGH (ref 70–99)
Potassium: 4.3 mmol/L (ref 3.5–5.1)
Sodium: 131 mmol/L — ABNORMAL LOW (ref 135–145)

## 2023-04-18 LAB — CBC
HCT: 40 % (ref 36.0–46.0)
Hemoglobin: 13.7 g/dL (ref 12.0–15.0)
MCH: 31.9 pg (ref 26.0–34.0)
MCHC: 34.3 g/dL (ref 30.0–36.0)
MCV: 93.2 fL (ref 80.0–100.0)
Platelets: 239 10*3/uL (ref 150–400)
RBC: 4.29 MIL/uL (ref 3.87–5.11)
RDW: 13.4 % (ref 11.5–15.5)
WBC: 12.3 10*3/uL — ABNORMAL HIGH (ref 4.0–10.5)
nRBC: 0 % (ref 0.0–0.2)

## 2023-04-18 LAB — RESP PANEL BY RT-PCR (RSV, FLU A&B, COVID)  RVPGX2
Influenza A by PCR: NEGATIVE
Influenza B by PCR: NEGATIVE
Resp Syncytial Virus by PCR: NEGATIVE
SARS Coronavirus 2 by RT PCR: NEGATIVE

## 2023-04-18 MED ORDER — SODIUM CHLORIDE 0.9 % IV BOLUS
1000.0000 mL | Freq: Once | INTRAVENOUS | Status: AC
Start: 2023-04-18 — End: 2023-04-19
  Administered 2023-04-19: 1000 mL via INTRAVENOUS

## 2023-04-18 NOTE — ED Triage Notes (Signed)
 Pt POV from home with family reporting intermittent headaches, fatigue, confusion and gait issues past few days. No weakness noted in triage, pt axo x4.  Recently treated for UTI 10 days ago.

## 2023-04-18 NOTE — ED Provider Notes (Signed)
 [facility] Emergency Department Provider Note MRN:  [id number]  Arrival date & time: 04/19/23     Chief Complaint   Fatigue   History of Present Illness   [name] is a 78 y.o. year-old female with a history of hypertension presenting to the ED with chief complaint of confusion.  Intermittent body aches, headaches, chills earlier in the week, thought she had the flu.  Having continued issues this evening with fever and an episode of confusion and gait instability with family.  Briefly seemed globally confused where she was using the remote control as a telephone.  Here in the emergency department seems to be doing much better.  Currently denies pain, has had no cough, no abdominal pain, no chest pain or shortness of breath.  Was recently treated for UTI 10 days ago.  Review of Systems  A thorough review of systems was obtained and all systems are negative except as noted in the HPI and PMH.   Patient's Health History    Past Medical History:  Diagnosis Date   Anemia    Ascending aorta dilatation (HCC)    Atrial dilatation    Bartholin cyst    Compression fracture    lumbar    Constipation    Disorder of bone density and structure, unspecified    Fatty liver    Gas bloat syndrome    Hearing loss    History of colon polyps    History of hiatal hernia    History of SCC (squamous cell carcinoma) of skin    Hypertension    Liver lesion    Mild cardiomegaly    Mitral regurgitation    Multinodular goiter    Obesity    Other iron deficiency anemia    Primary osteoarthritis    Pulmonary hypertension (HCC)    Pure hypercholesterolemia    Scoliosis    Sensory - neural hearing loss    right   Tremor    Tricuspid regurgitation    Vitamin D deficiency     Past Surgical History:  Procedure Laterality Date   APPENDECTOMY     BONE ANCHORED HEARING AID IMPLANT Right 10/10/2014   Procedure: BONE ANCHORED HEARING AID (BAHA) IMPLANT TEMPORAL BONE  RIGHT EAR;  Surgeon: Ermalinda Barrios, MD;  Location: [facility];  Service: ENT;  Laterality: Right;   BREAST REDUCTION SURGERY     CARPAL TUNNEL RELEASE Right    CATARACT EXTRACTION Bilateral    ROTATOR CUFF REPAIR Right    STAPEDECTOMY Bilateral    has metal implants (1 in each ear) - pt cannot have a MRI of her head/brain   UMBILICAL HERNIA REPAIR     XI ROBOTIC ASSISTED HIATAL HERNIA REPAIR N/A 03/03/2022   Procedure: XI ROBOTIC ASSISTED HIATAL HERNIA REPAIR WITH MESH AND FUNDOPLICATION;  Surgeon: Axel Filler, MD;  Location: [facility] OR;  Service: General;  Laterality: N/A;    Family History  Problem Relation Age of Onset   Other Mother 71       lived to be 58   Colon cancer Father    Clotting disorder Father    Deep vein thrombosis Father    Breast cancer Sister        mets    Social History   Socioeconomic History   Marital status: Married    Spouse name: Not on file   Number of children: 2   Years of education: Not on file   Highest education level: Not on  file  Occupational History   Not on file  Tobacco Use   Smoking status: Never   Smokeless tobacco: Never  Vaping Use   Vaping status: Never Used  Substance and Sexual Activity   Alcohol use: Yes    Comment: social   Drug use: No   Sexual activity: Not on file  Other Topics Concern   Not on file  Social History Narrative   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   She has 2 children   Occasional alcohol no tobacco or drug use   Social Drivers of Corporate investment banker Strain: Not on file  Food Insecurity: Low Risk  (05/14/2022)   [facility]   Hunger Vital Sign    Worried About Running Out of Food in the Last Year: Never true    Ran Out of Food in the Last Year: Never true  Transportation Needs: No Transportation Needs (05/14/2022)   [facility]   Transportation    In the past 12 months, has lack of reliable  transportation kept you from medical appointments, meetings, work or from getting things needed for daily living? : No  Physical Activity: Not on file  Stress: Not on file  Social Connections: Unknown (06/17/2021)   [facility]   Social Network    Social Network: Not on file  Intimate Partner Violence: Not At Risk (03/03/2022)   Humiliation, Afraid, Rape, and Kick questionnaire    Fear of Current or Ex-Partner: No    Emotionally Abused: No    Physically Abused: No    Sexually Abused: No     Physical Exam   Vitals:   04/19/23 0100 04/19/23 0115  BP: 133/76 137/75  Pulse: 76 78  Resp:  18  Temp:    SpO2: 96% 97%    CONSTITUTIONAL: Well-appearing, NAD NEURO/PSYCH:  Alert and oriented x 3, no focal deficits EYES:  eyes equal and reactive ENT/NECK:  no LAD, no JVD CARDIO: Tachycardic rate, well-perfused, normal S1 and S2 PULM:  CTAB no wheezing or rhonchi GI/GU:  non-distended, non-tender MSK/SPINE:  No gross deformities, no edema SKIN:  no rash, atraumatic   *Additional and/or pertinent findings included in MDM below  Diagnostic and Interventional Summary    EKG Interpretation Date/Time:  Sunday April 18 2023 21:56:34 EDT Ventricular Rate:  98 PR Interval:  136 QRS Duration:  66 QT Interval:  336 QTC Calculation: 428 R Axis:   33  Text Interpretation: Normal sinus rhythm ST & T wave abnormality, consider inferior ischemia Abnormal ECG When compared with ECG of 04-Mar-2022 15:25, Inverted T waves have replaced nonspecific T wave abnormality in Inferior leads T wave amplitude has increased in Lateral leads Confirmed by Kennis Carina [phone]) on 04/18/2023 11:24:37 PM       Labs Reviewed  BASIC METABOLIC PANEL - Abnormal; Notable for the following components:      Result Value   Sodium 131 (*)    CO2 21 (*)    Glucose, Bld 133 (*)    Creatinine, Ser 1.17 (*)    Calcium 8.7 (*)    GFR, Estimated 47 (*)    All other components within  normal limits  CBC - Abnormal; Notable for the following components:   WBC 12.3 (*)    All other components within normal limits  URINALYSIS, ROUTINE W REFLEX MICROSCOPIC - Abnormal; Notable for the following components:   Specific Gravity, Urine  1.031 (*)    Hgb urine dipstick SMALL (*)    Bilirubin Urine SMALL (*)    Ketones, ur 15 (*)    Protein, ur 30 (*)    Leukocytes,Ua SMALL (*)    Bacteria, UA FEW (*)    All other components within normal limits  RESP PANEL BY RT-PCR (RSV, FLU A&B, COVID)  [id number]  URINE CULTURE  CBG MONITORING, ED    DG Chest Port 1 View  Final Result      Medications  azithromycin (ZITHROMAX) tablet 500 mg (has no administration in time range)  cephALEXin (KEFLEX) capsule 500 mg (has no administration in time range)  sodium chloride 0.9 % bolus 1,000 mL (0 mLs Intravenous Stopped 04/19/23 0126)     Procedures  /  Critical Care Procedures  ED Course and Medical Decision Making  Initial Impression and Ddx Patient is well-appearing in no acute distress with reassuring vital signs, mild tachycardia.  She has normal and symmetric strength and sensation, normal coordination, normal speech, no aphasia, normal coordination, normal gait.  Family agrees that she is no longer exhibiting any of the confusion or fogginess from earlier.  Suspect this is in the setting of fever, reported 100 at home.  Could be viral illness, also considering recurrent UTI given the recent treatment for such.  Occult pneumonia is also considered.  Highly doubt acute CNS process such as stroke given the reassuring exam.  Electrolyte disturbance such as hyponatremia also considered.  Past medical/surgical history that increases complexity of ED encounter: Hypertension  Interpretation of Diagnostics I personally reviewed the EKG and my interpretation is as follows: Sinus rhythm with  Labs reveal hyponatremia, mild elevation of creatinine, mild leukocytosis  Patient Reassessment and  Ultimate Disposition/Management     With multiple rechecks patient is doing well, no return of confusion.  She has received a liter of fluids, feeling well.  Workup revealing some WBCs in the urine of unclear significance, UTI is considered, will send for culture.  Question of atypical infection in the lungs on x-ray versus edema.  Will cover with antibiotics, discussed the importance of monitoring symptoms, specific return precautions.  Has follow-up with primary care tomorrow morning.  Patient management required discussion with the following services or consulting groups:  None  Complexity of Problems Addressed Acute illness or injury that poses threat of life of bodily function  Additional Data Reviewed and Analyzed Further history obtained from: Further history from spouse/family member  Additional Factors Impacting ED Encounter Risk Consideration of hospitalization  Elmer Sow. Pilar Plate, MD [facility] Emergency Medicine [facility] [email or web address]  Final Clinical Impressions(s) / ED Diagnoses     ICD-10-CM   1. Hyponatremia  E87.1     2. Dehydration  E86.0     3. Fever, unspecified fever cause  R50.9     4. Confusion state  F44.89       ED Discharge Orders          Ordered    cefdinir (OMNICEF) 300 MG capsule  2 times daily        04/19/23 0130    azithromycin (ZITHROMAX) 250 MG tablet  Daily        04/19/23 0130             Discharge Instructions Discussed with and Provided to Patient:     Discharge Instructions      You were evaluated in the Emergency Department and after careful evaluation, we did not find any emergent condition  requiring admission or further testing in the hospital.  Your exam/testing today was overall reassuring.  Symptoms may be due to pneumonia or a urinary tract infection.  Dehydration and/or low sodium levels in your blood could have contributed to the symptoms as well.  Take the cefdinir and azithromycin  antibiotics at home as directed.  Plenty fluids and rest.  Keep your follow-up tomorrow.  Please return to the Emergency Department if you experience any worsening of your condition.  Thank you for allowing [place] to be a part of your care.        Sabas Sous, MD 04/19/23 [phone]

## 2023-04-19 ENCOUNTER — Other Ambulatory Visit (HOSPITAL_BASED_OUTPATIENT_CLINIC_OR_DEPARTMENT_OTHER)

## 2023-04-19 DIAGNOSIS — E860 Dehydration: Secondary | ICD-10-CM | POA: Diagnosis not present

## 2023-04-19 LAB — URINALYSIS, ROUTINE W REFLEX MICROSCOPIC
Glucose, UA: NEGATIVE mg/dL
Ketones, ur: 15 mg/dL — AB
Nitrite: NEGATIVE
Protein, ur: 30 mg/dL — AB
Specific Gravity, Urine: 1.031 — ABNORMAL HIGH (ref 1.005–1.030)
pH: 6 (ref 5.0–8.0)

## 2023-04-19 MED ORDER — AZITHROMYCIN 250 MG PO TABS
500.0000 mg | ORAL_TABLET | Freq: Once | ORAL | Status: AC
  Administered 2023-04-19: 500 mg via ORAL
  Filled 2023-04-19: qty 2

## 2023-04-19 MED ORDER — CEFDINIR 300 MG PO CAPS: 300.0000 mg | ORAL_CAPSULE | Freq: Two times a day (BID) | ORAL | 0 refills | Status: AC

## 2023-04-19 MED ORDER — AZITHROMYCIN 250 MG PO TABS: 250.0000 mg | ORAL_TABLET | Freq: Every day | ORAL | 0 refills | Status: AC

## 2023-04-19 MED ORDER — CEPHALEXIN 250 MG PO CAPS
500.0000 mg | ORAL_CAPSULE | Freq: Once | ORAL | Status: AC
  Administered 2023-04-19: 500 mg via ORAL
  Filled 2023-04-19: qty 2

## 2023-04-19 NOTE — Discharge Instructions (Signed)
 You were evaluated in the Emergency Department and after careful evaluation, we did not find any emergent condition requiring admission or further testing in the hospital.  Your exam/testing today was overall reassuring.  Symptoms may be due to pneumonia or a urinary tract infection.  Dehydration and/or low sodium levels in your blood could have contributed to the symptoms as well.  Take the cefdinir and azithromycin antibiotics at home as directed.  Plenty fluids and rest.  Keep your follow-up tomorrow.  Please return to the Emergency Department if you experience any worsening of your condition.  Thank you for allowing [place] to be a part of your care.

## 2023-04-20 LAB — URINE CULTURE

## 2023-04-21 DIAGNOSIS — E871 Hypo-osmolality and hyponatremia: Secondary | ICD-10-CM | POA: Diagnosis not present

## 2023-04-22 ENCOUNTER — Ambulatory Visit (HOSPITAL_BASED_OUTPATIENT_CLINIC_OR_DEPARTMENT_OTHER): Payer: Medicare Other | Admitting: Obstetrics & Gynecology

## 2023-04-28 ENCOUNTER — Ambulatory Visit: Payer: Medicare Other | Attending: Cardiology | Admitting: Cardiology

## 2023-04-28 ENCOUNTER — Encounter: Payer: Self-pay | Admitting: Cardiology

## 2023-04-28 VITALS — BP 148/96 | HR 63 | Resp 16 | Ht 64.0 in | Wt 185.0 lb

## 2023-04-28 DIAGNOSIS — Z86711 Personal history of pulmonary embolism: Secondary | ICD-10-CM | POA: Insufficient documentation

## 2023-04-28 DIAGNOSIS — I517 Cardiomegaly: Secondary | ICD-10-CM | POA: Diagnosis not present

## 2023-04-28 DIAGNOSIS — I361 Nonrheumatic tricuspid (valve) insufficiency: Secondary | ICD-10-CM | POA: Diagnosis not present

## 2023-04-28 NOTE — Progress Notes (Signed)
 Cardiology Office Note:  .   Date:  04/28/2023  ID:  [name], DOB [birth year], MRN [id number] PCP: Soundra Pilon, FNP  [facility] Providers Cardiologist:  Truett Mainland, MD PCP: Soundra Pilon, FNP  Chief Complaint  Patient presents with   Nonrheumatic tricuspid valve regurgitation   Follow-up     [name] is a 78 y.o. female with hyperlipidemia, chronic cough, hiatal hernia, h/o perioperative DVT/PE (03/2022).   Patient was seen in the emergency room earlier this month with complaints of severe fatigue, was found to be dehydrated with mild hyponatremia at 131, as well as fever of unclear cause.  It was felt that symptoms could be due to pneumonia or urinary tract infection.  She was recommended cefdinir and azithromycin antibiotics, plenty of fluids and rest.  She has been feeling much better since then, and is back to her baseline. Reviewed recent echocardiogram results with the patient, details below.  Blood pressure elevated today, but always normal otherwise.   Vitals:   04/28/23 0945  BP: (!) 148/96  Pulse: 63  Resp: 16  SpO2: 95%      Review of Systems  Cardiovascular:  Negative for chest pain, dyspnea on exertion, leg swelling, palpitations and syncope.        Studies Reviewed: Marland Kitchen        EKG 04/18/2023: Sinus rhythm 98 bpm and Inferior T wave inversion, consider ischemia  Independently interpreted 04/2023: Hb 13.7 Cr 1.17, eGFR 47, Na 131 TSH 0.7  11/2022: Chol 149, TG 66, HDL 66, LDL 70  Echocardiogram 04/08/2023:  1. Left ventricular ejection fraction, by estimation, is 60 to 65%. Left  ventricular ejection fraction by 3D volume is 62 %. The left ventricle has  normal function. The left ventricle has no regional wall motion  abnormalities. Left ventricular diastolic   parameters are indeterminate.   2. Right ventricular systolic function is normal. The right ventricular  size is mildly enlarged. There is mildly  elevated pulmonary artery  systolic pressure. The estimated right ventricular systolic pressure is  43.4 mmHg.   3. Left atrial size was severely dilated.   4. Right atrial size was mildly dilated.   5. The mitral valve is normal in structure. Mild mitral valve  regurgitation. No evidence of mitral stenosis.   6. The tricuspid valve is abnormal. Tricuspid valve regurgitation is  moderate.   7. The aortic valve is tricuspid. Aortic valve regurgitation is not  visualized. No aortic stenosis is present.   8. The inferior vena cava is normal in size with greater than 50%  respiratory variability, suggesting right atrial pressure of 3 mmHg.   9. 5.3 x 4.9 cm liver cyst noted.   Risk Assessment/Calculations:    CHA2DS2-VASc Score = 4  This indicates a 4.8% annual risk of stroke. The patient's score is based upon: CHF History: 0 HTN History: 1 Diabetes History: 0 Stroke History: 0 Vascular Disease History: 0 Age Score: 2 Gender Score: 1    Physical Exam Vitals and nursing note reviewed.  Constitutional:      General: She is not in acute distress. Neck:     Vascular: No JVD.  Cardiovascular:     Rate and Rhythm: Normal rate and regular rhythm.     Heart sounds: Normal heart sounds. No murmur heard. Pulmonary:     Effort: Pulmonary effort is normal.     Breath sounds: Normal breath sounds. No wheezing or rales.  Musculoskeletal:  Right lower leg: No edema.     Left lower leg: No edema.      VISIT DIAGNOSES:   ICD-10-CM   1. Biatrial enlargement  I51.7 ECHOCARDIOGRAM COMPLETE    2. Nonrheumatic tricuspid valve regurgitation  I36.1 ECHOCARDIOGRAM COMPLETE    3. History of pulmonary embolism  Z86.711       Assessment & Plan:  [name] is a 78 y.o. female with hyperlipidemia, chronic cough, hiatal hernia, h/o perioperative DVT/PE (03/2022).   Biatrial enlargement: Constellation of her echocardiographic findings do raise suspicion of PAF. Given that her LV  geometry and appearance is normal, other causes of biatrial enlargement (such as amyloidosis) are less likely.  She has had two separate outpatient cardiac telemetry without noted Afib. Regardless, patient is now on Eliquis 5 mg bid since her seemingly provoked perioperative DVT/PE in 03/2022. In absence of any bleeding issues, I do think the benefits of anticoagulation outweighs the risks. Therefore, we mutually have decided to continue Eliquis 5 mg bid.   Mod MR, TR: Stable MR TR with mild pH in the setting of severe left atrial dilatation, less likely WHO group PAH. No symptoms associated with this. Findings have been stable along 2 echocardiograms past a year apart. Repeat echocardiogram in 1 year.  Elevated blood pressure without diagnosis of hypertension: Blood pressure is always well-controlled as recently as last month.  Recommend regular home checks, and discuss with PCP.  Liver cyst:  Incidental finding on echocardiogram.  Recommend follow-up with PCP to consider vestibular ultrasound.      F/u in 1 year  Signed, Elder Negus, MD

## 2023-04-28 NOTE — Patient Instructions (Addendum)
 Testing/Procedures: Echo IN 1 YEAR  Your physician has requested that you have an echocardiogram. Echocardiography is a painless test that uses sound waves to create images of your heart. It provides your doctor with information about the size and shape of your heart and how well your heart's chambers and valves are working. This procedure takes approximately one hour. There are no restrictions for this procedure. Please do NOT wear cologne, perfume, aftershave, or lotions (deodorant is allowed). Please arrive 15 minutes prior to your appointment time.  Please note: We ask at that you not bring children with you during ultrasound (echo/ vascular) testing. Due to room size and safety concerns, children are not allowed in the ultrasound rooms during exams. Our front office staff cannot provide observation of children in our lobby area while testing is being conducted. An adult accompanying a patient to their appointment will only be allowed in the ultrasound room at the discretion of the ultrasound technician under special circumstances. We apologize for any inconvenience.    Follow-Up: At [facility], you and your health needs are our priority.  As part of our continuing mission to provide you with exceptional heart care, we have created designated Provider Care Teams.  These Care Teams include your primary Cardiologist (physician) and Advanced Practice Providers (APPs -  Physician Assistants and Nurse Practitioners) who all work together to provide you with the care you need, when you need it.    Your next appointment:   1 year(s)  Provider:   Elder Negus, MD     Other Instructions   1st Floor: - Lobby - Registration  - Pharmacy  - Lab - Cafe  2nd Floor: - PV Lab - Diagnostic Testing (echo, CT, nuclear med)  3rd Floor: - Vacant  4th Floor: - TCTS (cardiothoracic surgery) - AFib Clinic - Structural Heart Clinic - Vascular Surgery  - Vascular  Ultrasound  5th Floor: - HeartCare Cardiology (general and EP) - Clinical Pharmacy for coumadin, hypertension, lipid, weight-loss medications, and med management appointments    Valet parking services will be available as well.

## 2023-05-07 ENCOUNTER — Other Ambulatory Visit (HOSPITAL_BASED_OUTPATIENT_CLINIC_OR_DEPARTMENT_OTHER)

## 2023-05-07 DIAGNOSIS — N3000 Acute cystitis without hematuria: Secondary | ICD-10-CM

## 2023-05-07 DIAGNOSIS — M545 Low back pain, unspecified: Secondary | ICD-10-CM | POA: Diagnosis not present

## 2023-05-09 LAB — URINE CULTURE

## 2023-05-10 ENCOUNTER — Encounter (HOSPITAL_BASED_OUTPATIENT_CLINIC_OR_DEPARTMENT_OTHER): Payer: Self-pay | Admitting: Obstetrics & Gynecology

## 2023-05-10 DIAGNOSIS — L820 Inflamed seborrheic keratosis: Secondary | ICD-10-CM | POA: Diagnosis not present

## 2023-05-10 DIAGNOSIS — Z85828 Personal history of other malignant neoplasm of skin: Secondary | ICD-10-CM | POA: Diagnosis not present

## 2023-05-10 DIAGNOSIS — L821 Other seborrheic keratosis: Secondary | ICD-10-CM | POA: Diagnosis not present

## 2023-05-10 DIAGNOSIS — C44722 Squamous cell carcinoma of skin of right lower limb, including hip: Secondary | ICD-10-CM | POA: Diagnosis not present

## 2023-05-10 DIAGNOSIS — D485 Neoplasm of uncertain behavior of skin: Secondary | ICD-10-CM | POA: Diagnosis not present

## 2023-05-10 DIAGNOSIS — D1801 Hemangioma of skin and subcutaneous tissue: Secondary | ICD-10-CM | POA: Diagnosis not present

## 2023-05-10 DIAGNOSIS — L570 Actinic keratosis: Secondary | ICD-10-CM | POA: Diagnosis not present

## 2023-05-11 DIAGNOSIS — E871 Hypo-osmolality and hyponatremia: Secondary | ICD-10-CM | POA: Diagnosis not present

## 2023-05-26 DIAGNOSIS — K219 Gastro-esophageal reflux disease without esophagitis: Secondary | ICD-10-CM | POA: Diagnosis not present

## 2023-05-26 DIAGNOSIS — R490 Dysphonia: Secondary | ICD-10-CM | POA: Diagnosis not present

## 2023-06-08 DIAGNOSIS — Z85828 Personal history of other malignant neoplasm of skin: Secondary | ICD-10-CM | POA: Diagnosis not present

## 2023-06-08 DIAGNOSIS — L570 Actinic keratosis: Secondary | ICD-10-CM | POA: Diagnosis not present

## 2023-06-08 DIAGNOSIS — M25562 Pain in left knee: Secondary | ICD-10-CM | POA: Diagnosis not present

## 2023-06-08 DIAGNOSIS — M545 Low back pain, unspecified: Secondary | ICD-10-CM | POA: Diagnosis not present

## 2023-06-08 DIAGNOSIS — L820 Inflamed seborrheic keratosis: Secondary | ICD-10-CM | POA: Diagnosis not present

## 2023-06-08 DIAGNOSIS — M25561 Pain in right knee: Secondary | ICD-10-CM | POA: Diagnosis not present

## 2023-06-10 DIAGNOSIS — Z6833 Body mass index (BMI) 33.0-33.9, adult: Secondary | ICD-10-CM | POA: Diagnosis not present

## 2023-06-10 DIAGNOSIS — R300 Dysuria: Secondary | ICD-10-CM | POA: Diagnosis not present

## 2023-06-10 DIAGNOSIS — N958 Other specified menopausal and perimenopausal disorders: Secondary | ICD-10-CM | POA: Diagnosis not present

## 2023-06-18 ENCOUNTER — Encounter: Payer: Self-pay | Admitting: Pharmacist

## 2023-06-18 ENCOUNTER — Telehealth: Payer: Self-pay | Admitting: Cardiology

## 2023-06-18 ENCOUNTER — Encounter: Payer: Self-pay | Admitting: Pharmacy Technician

## 2023-06-18 ENCOUNTER — Other Ambulatory Visit (HOSPITAL_COMMUNITY): Payer: Self-pay

## 2023-06-18 NOTE — Telephone Encounter (Signed)
 This encounter was created in error - please disregard.

## 2023-06-18 NOTE — Telephone Encounter (Signed)
 Please complete PA for Repatha/Praluent

## 2023-06-18 NOTE — Telephone Encounter (Signed)
 error

## 2023-06-18 NOTE — Telephone Encounter (Signed)
 Patient identification verified by 2 forms. Sims Duck, RN     Encounter forwarded to primary cardiologist for review .

## 2023-06-18 NOTE — Telephone Encounter (Deleted)
 Pharmacy Patient Advocate Encounter   Received notification from Pt Calls Messages that prior authorization for Repatha is required/requested.   Insurance verification completed.   The patient is insured through [employer] .   Per test claim: PA required; PA submitted to above mentioned insurance via CoverMyMeds Key/confirmation #/EOC [id number] Status is pending

## 2023-06-18 NOTE — Telephone Encounter (Signed)
 Pt c/o medication issue:  1. Name of Medication: Estrogen 0.01 mg  2. How are you currently taking this medication (dosage and times per day)?   3. Are you having a reaction (difficulty breathing--STAT)?   4. What is your medication issue? Patient wants to know if Dr Filiberto Hug thinks it will be alright for her to take this medicine??

## 2023-06-21 NOTE — Telephone Encounter (Signed)
 Pt reports that it is a estrogen cream.

## 2023-06-21 NOTE — Telephone Encounter (Signed)
 Please check with the pt.  Thanks MJP

## 2023-06-22 NOTE — Telephone Encounter (Signed)
 fyi

## 2023-06-22 NOTE — Telephone Encounter (Signed)
 Should be ok to take as prescribed. Recommend continuing close follow up with GYN

## 2023-06-22 NOTE — Telephone Encounter (Signed)
 Pt contacted and advised. Pt agree with plan of care.

## 2023-06-22 NOTE — Telephone Encounter (Signed)
 Recommend seeing what patient is treating. Estrogen is known to increase the risk of clotting and she has a PMH of DVT and PE.  This risk is lower with the cream formulation. However I do not see any notes about starting any estrogen creams in recent GYN notes

## 2023-06-23 NOTE — Telephone Encounter (Signed)
 Noted.  Thanks MJP

## 2023-06-24 ENCOUNTER — Encounter: Payer: Self-pay | Admitting: Internal Medicine

## 2023-06-24 ENCOUNTER — Ambulatory Visit (INDEPENDENT_AMBULATORY_CARE_PROVIDER_SITE_OTHER): Admitting: Internal Medicine

## 2023-06-24 ENCOUNTER — Telehealth: Payer: Self-pay

## 2023-06-24 VITALS — BP 126/74 | HR 84 | Ht 62.5 in | Wt 178.2 lb

## 2023-06-24 DIAGNOSIS — R1310 Dysphagia, unspecified: Secondary | ICD-10-CM

## 2023-06-24 DIAGNOSIS — Z7901 Long term (current) use of anticoagulants: Secondary | ICD-10-CM

## 2023-06-24 DIAGNOSIS — K219 Gastro-esophageal reflux disease without esophagitis: Secondary | ICD-10-CM

## 2023-06-24 DIAGNOSIS — B370 Candidal stomatitis: Secondary | ICD-10-CM | POA: Diagnosis not present

## 2023-06-24 DIAGNOSIS — R1319 Other dysphagia: Secondary | ICD-10-CM

## 2023-06-24 DIAGNOSIS — Z9889 Other specified postprocedural states: Secondary | ICD-10-CM

## 2023-06-24 MED ORDER — CLOTRIMAZOLE 10 MG MT TROC: 10.0000 mg | Freq: Every day | OROMUCOSAL | 0 refills | Status: DC

## 2023-06-24 NOTE — Telephone Encounter (Signed)
 Please advise Sir, thank you.

## 2023-06-24 NOTE — Patient Instructions (Addendum)
 You have been scheduled for a Barium Esophogram at [facility] (1st floor of the hospital) on 07/13/2023 at 10:00am. Please arrive 15 minutes prior to your appointment for registration. Make certain not to have anything to eat or drink 3 hours prior to your test. If you need to reschedule for any reason, please contact radiology at [phone] to do so. __________________________________________________________________ A barium swallow is an examination that concentrates on views of the esophagus. This tends to be a double contrast exam (barium and two liquids which, when combined, create a gas to distend the wall of the oesophagus) or single contrast (non-ionic iodine based). The study is usually tailored to your symptoms so a good history is essential. Attention is paid during the study to the form, structure and configuration of the esophagus, looking for functional disorders (such as aspiration, dysphagia, achalasia, motility and reflux) EXAMINATION You may be asked to change into a gown, depending on the type of swallow being performed. A radiologist and radiographer will perform the procedure. The radiologist will advise you of the type of contrast selected for your procedure and direct you during the exam. You will be asked to stand, sit or lie in several different positions and to hold a small amount of fluid in your mouth before being asked to swallow while the imaging is performed .In some instances you may be asked to swallow barium coated marshmallows to assess the motility of a solid food bolus. The exam can be recorded as a digital or video fluoroscopy procedure. POST PROCEDURE It will take 1-2 days for the barium to pass through your system. To facilitate this, it is important, unless otherwise directed, to increase your fluids for the next 24-48hrs and to resume your normal diet.  This test typically takes about 30 minutes to  perform. __________________________________________________________________________________  [name] have been scheduled for an endoscopy. Please follow written instructions given to you at your visit today.  If you use inhalers (even only as needed), please bring them with you on the day of your procedure.  If you take any of the following medications, they will need to be adjusted prior to your procedure:   DO NOT TAKE 7 DAYS PRIOR TO TEST- Trulicity (dulaglutide) Ozempic, Wegovy (semaglutide) Mounjaro (tirzepatide) Bydureon Bcise (exanatide extended release)  DO NOT TAKE 1 DAY PRIOR TO YOUR TEST Rybelsus (semaglutide) Adlyxin (lixisenatide) Victoza (liraglutide) Byetta (exanatide) ___________________________________________________________________________  [name] .  If you do not hear from our office 1 week prior to your scheduled procedure, please call [phone] to discuss. Ask for PJ, CMA  _______________________________________________________ We have sent  medications to your pharmacy for you to pick up at your convenience.  If your blood pressure at your visit was 140/90 or greater, please contact your primary care physician to follow up on this.  _______________________________________________________  If you are age 100 or older, your body mass index should be between 23-30. Your Body mass index is 32.08 kg/m. If this is out of the aforementioned range listed, please consider follow up with your Primary Care Provider.  If you are age 62 or younger, your body mass index should be between 19-25. Your Body mass index is 32.08 kg/m. If this is out of the aformentioned range listed, please consider follow up with your Primary Care Provider.   ________________________________________________________  The [facility] providers would like to encourage you to use MYCHART to communicate with  providers  for non-urgent requests or questions.  Due to long hold times on the telephone, sending your provider a message by [facility] may be a faster and more efficient way to get a response.  Please allow 48 business hours for a response.  Please remember that this is for non-urgent requests.  _______________________________________________________  I appreciate the opportunity to care for you. Hannah Ruff, MD, [facility]

## 2023-06-24 NOTE — Telephone Encounter (Signed)
   Patient Name: [name]  DOB: Sep 30, 1945 MRN: [id number]  Primary Cardiologist: Cody Das, MD  Clinical pharmacists have reviewed the patient's past medical history, labs, and current medications as part of preoperative protocol coverage. The following recommendations have been made:   Given hx of erioperative DVT/PE (03/2022) off of DOAC recommend to hold for only 1 day prior to the procedure. And resume Eliquis  post procedure as soon as safely possible    I will route this recommendation to the requesting party via Epic fax function and remove from pre-op pool.  Please call with questions.  Francene Ing, Retha Cast, NP 06/24/2023, 4:02 PM

## 2023-06-24 NOTE — Telephone Encounter (Signed)
 [facility] Pre-operative Risk Assessment     Request for surgical clearance:     Endoscopy Procedure  What type of surgery is being performed?     EGD  When is this surgery scheduled?     08/09/2023  What type of clearance is required ?   Pharmacy  Are there any medications that need to be held prior to surgery and how long? Eliquis , 2 days  Practice name and name of physician performing surgery?      [facility]  What is your office phone and fax number?      Phone- [phone]  Fax- [phone]  Anesthesia type (None, local, MAC, general) ?       MAC   Please route your response to Jayleen Scaglione [place], CMA (AAMA)

## 2023-06-24 NOTE — Telephone Encounter (Signed)
 Pharmacy please advise on holding Eliquis  prior to endoscopy scheduled for 08/09/2023. Thank you.

## 2023-06-24 NOTE — Telephone Encounter (Signed)
 Patient with diagnosis of atrial fibrilation and h/o perioperative DVT/PE (03/2022) on Eliquis  for anticoagulation.    Procedure: EGD  Date of procedure: 08/09/23   CHA2DS2-VASc Score = 4   This indicates a 4.8% annual risk of stroke. The patient's score is based upon: CHF History: 0 HTN History: 1 Diabetes History: 0 Stroke History: 0 Vascular Disease History: 0 Age Score: 2 Gender Score: 1     CrCl 38 mL/min  Platelet count 239 K  Patient has not  had an Afib/aflutter ablation within the last 3 months or DCCV within the last 30 days    Given hx of erioperative DVT/PE (03/2022) off of DOAC recommend to hold for only 1 day prior to the procedure. And resume Eliquis  post procedure as soon as safely possible     **This guidance is not considered finalized until pre-operative APP has relayed final recommendations.**

## 2023-06-24 NOTE — Progress Notes (Signed)
 [name] 78 y.o. September 22, 1945 [id number]  Assessment & Plan:   Encounter Diagnoses  Name Primary?   Esophageal dysphagia Yes   Laryngopharyngeal reflux (LPR)?    Oral thrush    Anticoagulated    S/P laparoscopic fundoplication    Assess with barium swallow and tablet and EGD.  Could need esophageal dilation.  Did have some dysmotility on the postoperative esophagram.  She certainly could have some LPR.  Voice changes could be from other causes also. I would hope that the hiatal hernia repair resolved reflux issues, however.  Perhaps it has slipped.  Treat oral thrush which I think is coming from prednisone treatment for sciatica with Mycelex troches.  I do not think this is related or involved with her other symptoms.  The patient is on chronic Eliquis  at the direction of cardiology due to a suspicion of "hidden atrial fibrillation" per the patient.  Given the potential need for esophageal dilation it would be appropriate to hold the Eliquis  2 days prior to her procedure.  There is an increased risk of stroke and DVT while off this medication which I have reviewed with her as well as the standard risks benefits and indications of an upper GI endoscopy and possible esophageal dilation.  She has reviewed her informed consent piece.  This increases the risk of the procedure some. Will clarify holding of El;iquis with cardiology. Meds ordered this encounter  Medications   clotrimazole (MYCELEX) 10 MG troche    Sig: Take 1 tablet (10 mg total) by mouth 5 (five) times daily.    Dispense:  35 Troche    Refill:  0      Subjective:   Chief Complaint: dysphagia, voice change  HPI 78 year old woman with a history of laparoscopic hiatal hernia repair and toupee fundoplication March 03, 2022, previously seen for bloating gas diarrhea and constipation and fecal incontinence after that.  Discussed the use of AI scribe software for clinical note transcription with the patient, who  gave verbal consent to proceed.   Ileanna H [name] is an 78 year old female who presents with voice changes and swallowing difficulties.  Her voice has become softer and rougher since her hiatal hernia surgery. She is concerned about her esophagus post-surgery. [facility] ENT noted throat changes possibly due to acid reflux, but she does not have classic heartburn symptoms. Laryngeal pharyngeal reflux was discussed, but she declined acid-blocking medications.  Swallowing difficulties are periodic, with larger pills sometimes getting stuck. She takes capsules in groups to avoid swallowing too many at once. Similar issues occur with food if consumed too quickly, but these episodes are intermittent, and she can go weeks without problems. She has not regurgitated food and does not identify specific foods causing issues.  She experienced bloating, gas, and changes in bowel habits, which improved significantly after metronidazole  treatment for SIBO. She has had only two small episodes of diarrhea since December or January.  She is on Eliquis  for suspected hidden atrial fibrillation and a history of blood clots post-hiatal hernia repair, including DVT and pulmonary embolism. She is also taking prednisone for sciatic nerve pain. She has experienced oral thrush before and was treated with clotrimazole troches.     She has done very well with stool consistency, defecation and incontinence problems after pelvic floor Pt and treatment with metronidazole .  ENT note review: She saw ENT [facility] ot[facility]) 05/26/2023 with a 68-month history of voice changes having some pill dysphagia.  Had been seen 05/14/2022 for epistaxis,  and a sinus CT [id number] had demonstrated right mild sphenoid sinusitis.  A fiberoptic exam demonstrated mild to moderate posterior glottic edema otherwise normal.  The suspicion was laryngeal pharyngeal reflux.    Gastric emptying scan 07/08/2022 with delayed 3 and 4-hour emptying,  normal 1 and 2-hour emptying. 20.9% emptied at 1 hr ( normal >= 10%)   59% emptied at 2 hr ( normal >= 40%)   67.7% emptied at 3 hr ( normal >= 70%)   81.5% emptied at 4 hr ( normal >= 90%)   IMPRESSION: Gastric emptying is normal at 1 and 2 hours but delayed at 3 and 4 hours.   Postoperative esophagram 03/04/2022 IMPRESSION: 1. A problem-oriented water-soluble contrast esophagram was performed to assess for leak or obstruction status post hiatal hernia repair and Toupet fundoplication. 2. No extraluminal contrast demonstrated to suggest a leak. 3. Mildly delayed passage of contrast from the distal esophagus into the stomach, possibly due to postoperative edema. 4. Moderate esophageal dysmotility  Last EGD January 09 2013, Dr. Andriette Keeling.  Normal exam healed gastric ulcer.   Echocardiogram 04/08/2023:  1. Left ventricular ejection fraction, by estimation, is 60 to 65%. Left  ventricular ejection fraction by 3D volume is 62 %. The left ventricle has  normal function. The left ventricle has no regional wall motion  abnormalities. Left ventricular diastolic   parameters are indeterminate.   2. Right ventricular systolic function is normal. The right ventricular  size is mildly enlarged. There is mildly elevated pulmonary artery  systolic pressure. The estimated right ventricular systolic pressure is  43.4 mmHg.   3. Left atrial size was severely dilated.   4. Right atrial size was mildly dilated.   5. The mitral valve is normal in structure. Mild mitral valve  regurgitation. No evidence of mitral stenosis.   6. The tricuspid valve is abnormal. Tricuspid valve regurgitation is  moderate.   7. The aortic valve is tricuspid. Aortic valve regurgitation is not  visualized. No aortic stenosis is present.   8. The inferior vena cava is normal in size with greater than 50%  respiratory variability, suggesting right atrial pressure of 3 mmHg.   9. 5.3 x 4.9 cm liver cyst noted.     No  Known Allergies Current Meds  Medication Sig   Cholecalciferol (VITAMIN D3) 1000 units CAPS Take 2 capsules by mouth daily.   estradiol (ESTRACE) 0.1 MG/GM vaginal cream Place 1 Applicatorful vaginally at bedtime.   FOSAMAX 70 MG tablet Take 70 mg by mouth once a week.   METAMUCIL FIBER PO Take by mouth.   predniSONE (DELTASONE) 10 MG tablet Take 10 mg by mouth 3 (three) times daily.   Past Medical History:  Diagnosis Date   Anemia    Ascending aorta dilatation (HCC)    Atrial dilatation    Bartholin cyst    Compression fracture    lumbar    Constipation    Disorder of bone density and structure, unspecified    Fatty liver    Gas bloat syndrome    Hearing loss    History of colon polyps    History of hiatal hernia    History of SCC (squamous cell carcinoma) of skin    Hypertension    Liver lesion    Mild cardiomegaly    Mitral regurgitation    Multinodular goiter    Obesity    Other iron deficiency anemia    Primary osteoarthritis    Pulmonary hypertension (HCC)  Pure hypercholesterolemia    Scoliosis    Sensory - neural hearing loss    right   Tremor    Tricuspid regurgitation    Vitamin D deficiency    Past Surgical History:  Procedure Laterality Date   APPENDECTOMY     BONE ANCHORED HEARING AID IMPLANT Right 10/10/2014   Procedure: BONE ANCHORED HEARING AID (BAHA) IMPLANT TEMPORAL BONE RIGHT EAR;  Surgeon: Ryland Cozier, MD;  Location: [facility];  Service: ENT;  Laterality: Right;   BREAST REDUCTION SURGERY     CARPAL TUNNEL RELEASE Right    CATARACT EXTRACTION Bilateral    ROTATOR CUFF REPAIR Right    STAPEDECTOMY Bilateral    has metal implants (1 in each ear) - pt cannot have a MRI of her head/brain   UMBILICAL HERNIA REPAIR     XI ROBOTIC ASSISTED HIATAL HERNIA REPAIR N/A 03/03/2022   Procedure: XI ROBOTIC ASSISTED HIATAL HERNIA REPAIR WITH MESH AND FUNDOPLICATION;  Surgeon: Shela Derby, MD;  Location: [facility] OR;  Service: General;   Laterality: N/A;   Social History   Social History Narrative   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   Review of Systems As per HPI  Objective:   Physical Exam @BP  126/74 (BP Location: Left Arm, Patient Position: Sitting, Cuff Size: Large)   Pulse 84   Ht 5' 2.5" (1.588 m)   Wt 178 lb 4 oz (80.9 kg)   BMI 32.08 kg/m @  General:  Well-developed, well-nourished and in no acute distress Eyes:  anicteric. ENT:   Mouth and posterior pharynx oral thrush, good dentition  Neck:   supple w/o thyromegaly or mass.  Lungs: Clear to auscultation bilaterally. Heart:   S1S2, no rubs, murmurs, gallops. Abdomen:  soft, non-tender, no hepatosplenomegaly, hernia, or mass and BS+.  Neuro:  A&O x 3.  Psych:  appropriate mood and  Affect.   Data Reviewed: See HPI  48 minutes total time before during and after the patient visit.

## 2023-06-25 NOTE — Telephone Encounter (Signed)
?   About the 1 day recommendation   She may need an esophageal dilation which is a higher bleeding risk.  The EGD is a quick procedure and the procedure itself should not really increase the risk of DVT like a surgery would.  If the concern is DVT risk from an EGD I would think we could hold it 2 days since it is not the same as general anesthesia and prolonged immobilization like surgery.  Thanks for your consideration and help.  Kenney Peacemaker, MD, [facility]

## 2023-06-30 NOTE — Telephone Encounter (Signed)
   Patient Name: [name]  DOB: 1945/03/15 MRN: [id number]  Primary Cardiologist: Cody Das, MD  Clinical pharmacists have reviewed the patient's past medical history, labs, and current medications as part of preoperative protocol coverage. The following additional recommendations have been made:   Given high bleeding risk ok to hold Eliquis  2 days prior to procedure,  resume Eliquis  post procedure as soon as safely possible    I will route this recommendation to the requesting party via Epic fax function and remove from pre-op pool.  Please call with questions.  Leala Prince, PA-C 06/30/2023, 4:30 PM

## 2023-06-30 NOTE — Telephone Encounter (Signed)
 Redell informed to hold Eliquis  2 days prior to procedure and verbalized understanding.

## 2023-06-30 NOTE — Telephone Encounter (Signed)
 Given high bleeding risk ok to hold Eliquis  2 days prior to procedure,  resume Eliquis  post procedure as soon as safely possible

## 2023-07-08 DIAGNOSIS — B372 Candidiasis of skin and nail: Secondary | ICD-10-CM | POA: Diagnosis not present

## 2023-07-08 DIAGNOSIS — N952 Postmenopausal atrophic vaginitis: Secondary | ICD-10-CM | POA: Diagnosis not present

## 2023-07-08 DIAGNOSIS — Z6829 Body mass index (BMI) 29.0-29.9, adult: Secondary | ICD-10-CM | POA: Diagnosis not present

## 2023-07-08 DIAGNOSIS — N909 Noninflammatory disorder of vulva and perineum, unspecified: Secondary | ICD-10-CM | POA: Diagnosis not present

## 2023-07-09 DIAGNOSIS — Z961 Presence of intraocular lens: Secondary | ICD-10-CM | POA: Diagnosis not present

## 2023-07-09 DIAGNOSIS — Z9841 Cataract extraction status, right eye: Secondary | ICD-10-CM | POA: Diagnosis not present

## 2023-07-09 DIAGNOSIS — H26492 Other secondary cataract, left eye: Secondary | ICD-10-CM | POA: Diagnosis not present

## 2023-07-13 ENCOUNTER — Ambulatory Visit (HOSPITAL_COMMUNITY)
Admission: RE | Admit: 2023-07-13 | Discharge: 2023-07-13 | Disposition: A | Discharge status: Home / Self Care | Source: Ambulatory Visit | Attending: Internal Medicine | Admitting: Internal Medicine

## 2023-07-13 ENCOUNTER — Ambulatory Visit: Payer: Self-pay | Admitting: Internal Medicine

## 2023-07-13 DIAGNOSIS — R1319 Other dysphagia: Secondary | ICD-10-CM | POA: Insufficient documentation

## 2023-07-13 DIAGNOSIS — R1310 Dysphagia, unspecified: Secondary | ICD-10-CM | POA: Diagnosis not present

## 2023-07-13 DIAGNOSIS — K449 Diaphragmatic hernia without obstruction or gangrene: Secondary | ICD-10-CM | POA: Diagnosis not present

## 2023-07-13 DIAGNOSIS — K224 Dyskinesia of esophagus: Secondary | ICD-10-CM | POA: Diagnosis not present

## 2023-07-13 DIAGNOSIS — K219 Gastro-esophageal reflux disease without esophagitis: Secondary | ICD-10-CM | POA: Insufficient documentation

## 2023-07-14 ENCOUNTER — Ambulatory Visit: Payer: Self-pay | Admitting: Cardiology

## 2023-07-22 ENCOUNTER — Ambulatory Visit (HOSPITAL_BASED_OUTPATIENT_CLINIC_OR_DEPARTMENT_OTHER): Payer: Medicare Other | Admitting: Obstetrics & Gynecology

## 2023-07-25 DIAGNOSIS — R609 Edema, unspecified: Secondary | ICD-10-CM | POA: Diagnosis not present

## 2023-07-27 ENCOUNTER — Telehealth: Payer: Self-pay | Admitting: Cardiology

## 2023-07-27 NOTE — Telephone Encounter (Signed)
 Recently had an echo in 04/2023, known MR, TR. Let me see her in the office to evaluate.  Thanks MJP

## 2023-07-27 NOTE — Telephone Encounter (Signed)
 Called patient about message. Patient stated BNP 155.8 done by PCP office. Patient complaining of some SOB with activity and BLE edema. PCP told patient to call her cardiologist and get an echo done. PCP did not start any dieretics, and left that up to the cardiologist. Patient will send a copy of lab work through [employer]. Will send message to Dr. Elmira for advisement.

## 2023-07-27 NOTE — Telephone Encounter (Signed)
 Pt requesting to have a Echo. Please advise

## 2023-07-27 NOTE — Telephone Encounter (Signed)
 Made patient an appointment to see Dr. Elmira on Monday.

## 2023-07-28 ENCOUNTER — Telehealth: Payer: Self-pay | Admitting: Internal Medicine

## 2023-07-28 NOTE — Telephone Encounter (Signed)
 [name] was seen at the [facility] walk in clinic on Sunday 07/25/2023. They did a BNP test which was elevated at 155.8.  She is seeing Dr Newman Lawrence on 08/02/2023 to get clearance for her upcoming procedure and to see if she needs more testing. I told her I will let Dr Avram know all this and we will leave her on the books for now 08/09/2023.

## 2023-07-28 NOTE — Telephone Encounter (Signed)
 Patient requesting f/u call to discuss upcoming procedure. Please advise.   Thank you

## 2023-07-30 ENCOUNTER — Telehealth: Payer: Self-pay | Admitting: Cardiology

## 2023-07-30 NOTE — Telephone Encounter (Signed)
 Spoke with patient and she would to ensure he will review her DG esophagus before appointment on 6/30.

## 2023-07-30 NOTE — Telephone Encounter (Signed)
 New Message:    Patient would like for Dr Gilda nurse or a nurse to call her. She is scheduled to see Dr Elmira on Monday(08-02-23). She wants him to be sure he see  her DG Esophagus with double CM(HD) test results from 07-13-23  and also a message from Dr Avram. She wants to make  sure he see this before her appointment on Monday morning at 11:00.

## 2023-07-31 ENCOUNTER — Encounter (HOSPITAL_BASED_OUTPATIENT_CLINIC_OR_DEPARTMENT_OTHER): Payer: Self-pay

## 2023-07-31 ENCOUNTER — Other Ambulatory Visit: Payer: Self-pay

## 2023-07-31 ENCOUNTER — Emergency Department (HOSPITAL_BASED_OUTPATIENT_CLINIC_OR_DEPARTMENT_OTHER): Admitting: Radiology

## 2023-07-31 ENCOUNTER — Emergency Department (HOSPITAL_BASED_OUTPATIENT_CLINIC_OR_DEPARTMENT_OTHER)
Admission: EM | Admit: 2023-07-31 | Discharge: 2023-07-31 | Disposition: A | Discharge status: Home / Self Care | Attending: Emergency Medicine | Admitting: Emergency Medicine

## 2023-07-31 DIAGNOSIS — R0600 Dyspnea, unspecified: Secondary | ICD-10-CM | POA: Diagnosis not present

## 2023-07-31 DIAGNOSIS — M7989 Other specified soft tissue disorders: Secondary | ICD-10-CM | POA: Diagnosis not present

## 2023-07-31 DIAGNOSIS — Z7901 Long term (current) use of anticoagulants: Secondary | ICD-10-CM | POA: Diagnosis not present

## 2023-07-31 DIAGNOSIS — R7989 Other specified abnormal findings of blood chemistry: Secondary | ICD-10-CM | POA: Diagnosis not present

## 2023-07-31 DIAGNOSIS — R0609 Other forms of dyspnea: Secondary | ICD-10-CM

## 2023-07-31 DIAGNOSIS — I517 Cardiomegaly: Secondary | ICD-10-CM | POA: Diagnosis not present

## 2023-07-31 DIAGNOSIS — I771 Stricture of artery: Secondary | ICD-10-CM | POA: Diagnosis not present

## 2023-07-31 DIAGNOSIS — I10 Essential (primary) hypertension: Secondary | ICD-10-CM | POA: Diagnosis not present

## 2023-07-31 DIAGNOSIS — R0602 Shortness of breath: Secondary | ICD-10-CM | POA: Diagnosis not present

## 2023-07-31 DIAGNOSIS — I700 Atherosclerosis of aorta: Secondary | ICD-10-CM | POA: Diagnosis not present

## 2023-07-31 LAB — TROPONIN T, HIGH SENSITIVITY
Troponin T High Sensitivity: 22 ng/L — ABNORMAL HIGH (ref <19)
Troponin T High Sensitivity: 21 ng/L — ABNORMAL HIGH (ref <19)

## 2023-07-31 LAB — CBC WITH DIFFERENTIAL/PLATELET
Abs Immature Granulocytes: 0.01 10*3/uL (ref 0.00–0.07)
Basophils Absolute: 0 10*3/uL (ref 0.0–0.1)
Basophils Relative: 0 %
Eosinophils Absolute: 0.1 10*3/uL (ref 0.0–0.5)
Eosinophils Relative: 2 %
HCT: 40.5 % (ref 36.0–46.0)
Hemoglobin: 13.4 g/dL (ref 12.0–15.0)
Immature Granulocytes: 0 %
Lymphocytes Relative: 17 %
Lymphs Abs: 0.8 10*3/uL (ref 0.7–4.0)
MCH: 32.3 pg (ref 26.0–34.0)
MCHC: 33.1 g/dL (ref 30.0–36.0)
MCV: 97.6 fL (ref 80.0–100.0)
Monocytes Absolute: 0.4 10*3/uL (ref 0.1–1.0)
Monocytes Relative: 9 %
Neutro Abs: 3.4 10*3/uL (ref 1.7–7.7)
Neutrophils Relative %: 72 %
Platelets: 303 10*3/uL (ref 150–400)
RBC: 4.15 MIL/uL (ref 3.87–5.11)
RDW: 12.9 % (ref 11.5–15.5)
WBC: 4.8 10*3/uL (ref 4.0–10.5)
nRBC: 0 % (ref 0.0–0.2)

## 2023-07-31 LAB — COMPREHENSIVE METABOLIC PANEL WITH GFR
ALT: 17 U/L (ref 0–44)
AST: 28 U/L (ref 15–41)
Albumin: 4.1 g/dL (ref 3.5–5.0)
Alkaline Phosphatase: 46 U/L (ref 38–126)
Anion gap: 10 (ref 5–15)
BUN: 19 mg/dL (ref 8–23)
CO2: 25 mmol/L (ref 22–32)
Calcium: 9.7 mg/dL (ref 8.9–10.3)
Chloride: 108 mmol/L (ref 98–111)
Creatinine, Ser: 0.84 mg/dL (ref 0.44–1.00)
GFR, Estimated: >60 mL/min (ref >60)
Glucose, Bld: 105 mg/dL — ABNORMAL HIGH (ref 70–99)
Potassium: 4.5 mmol/L (ref 3.5–5.1)
Sodium: 142 mmol/L (ref 135–145)
Total Bilirubin: 0.5 mg/dL (ref 0.0–1.2)
Total Protein: 6.6 g/dL (ref 6.5–8.1)

## 2023-07-31 LAB — MAGNESIUM: Magnesium: 1.9 mg/dL (ref 1.7–2.4)

## 2023-07-31 LAB — PRO BRAIN NATRIURETIC PEPTIDE: Pro Brain Natriuretic Peptide: 348 pg/mL — ABNORMAL HIGH (ref <300.0)

## 2023-07-31 MED ORDER — FUROSEMIDE 10 MG/ML IJ SOLN
20.0000 mg | Freq: Once | INTRAMUSCULAR | Status: AC
  Administered 2023-07-31: 20 mg via INTRAVENOUS
  Filled 2023-07-31: qty 2

## 2023-07-31 MED ORDER — FUROSEMIDE 40 MG PO TABS
40.0000 mg | ORAL_TABLET | Freq: Every day | ORAL | 0 refills | Status: DC
Start: 2023-07-31 — End: 2023-08-02

## 2023-07-31 MED ORDER — ACETAMINOPHEN 500 MG PO TABS
1000.0000 mg | ORAL_TABLET | Freq: Once | ORAL | Status: AC
  Administered 2023-07-31: 1000 mg via ORAL
  Filled 2023-07-31: qty 2

## 2023-07-31 NOTE — ED Notes (Signed)
 ED Provider at bedside.

## 2023-07-31 NOTE — Progress Notes (Signed)
 Walked pt around unit o2 saturations remained 98-99%.

## 2023-07-31 NOTE — ED Provider Notes (Signed)
 [place] [facility] Provider Note   CSN: [id number] Arrival date & time: 07/31/23  1237     Patient presents with: Leg Swelling   [name] is a 78 y.o. female.   78 year old female presents today for concern of bilateral lower extremity swelling as well as abdominal distention.  Endorses some exertional dyspnea.  No previous history of CHF.  However recently had an elevated BNP at her PCP walk-in clinic.  She was not started on a diuretic and was instructed to follow-up with her cardiologist.  She has an appointment scheduled with her cardiologist for 6/30 which is in 2 days.  She states last month she was here for dehydration from recurrent UTI.  She was instructed to drink some electrolyte drinks.  She states that she has done this to in excess and has been drinking this daily.  Her daughter accompanies her at bedside.  She does endorse that her clothes are fitting tighter and are more uncomfortable.  She denies chest pain.  The history is provided by the patient. No language interpreter was used.       Prior to Admission medications   Medication Sig Start Date End Date Taking? Authorizing Provider  imiquimod (ALDARA) 5 % cream Apply 1 Application topically every evening. 03/24/23  Yes [provider]  nystatin cream (MYCOSTATIN) Apply 1 Application topically 2 (two) times daily. 07/08/23  Yes [provider]  valACYclovir (VALTREX) 500 MG tablet Take 500 mg by mouth 2 (two) times daily. 07/08/23  Yes [provider]  acetaminophen  (TYLENOL ) 500 MG tablet Take 1,000 mg by mouth every 8 (eight) hours as needed (pain.).    [provider]  AMBULATORY NON FORMULARY MEDICATION Melatonin patch 6 mg with magnesium Apply 1 patch transdermal nightly    [provider]  AMBULATORY NON FORMULARY MEDICATION Phenocal weight control Take 2 capsule by mouth daily    [provider]  AMBULATORY NON  FORMULARY MEDICATION Nervprin Bioperine 2 capsules by mouth daily    [provider]  AMBULATORY NON FORMULARY MEDICATION Take 1 capsule by mouth daily. Revive CBD Capsule    [provider]  AMBULATORY NON FORMULARY MEDICATION UT Fix 2 capsules by mouth daily    [provider]  AMBULATORY NON FORMULARY MEDICATION Native Balance Magnesium with Ashwaganda Take 2 capsules by mouth daily    [provider]  AMBULATORY NON FORMULARY MEDICATION Fish Oil Ultra EDA-DHA 1300 mg Take 2 tablets by mouth daily    [provider]  apixaban  (ELIQUIS ) 5 MG TABS tablet TAKE 1 TABLET TWICE A DAY 03/02/23   Patwardhan, Manish J, MD  Brimonidine Tartrate (LUMIFY) 0.025 % SOLN Place 1 drop into both eyes 2 (two) times daily as needed (dry/irritated eyes.).    [provider]  carboxymethylcellulose (REFRESH PLUS) 0.5 % SOLN Place 1 drop into both eyes 3 (three) times daily as needed (dry/irritated eyes.).    [provider]  cetirizine (ZYRTEC) 10 MG tablet Take 10 mg by mouth daily as needed (seasonal allergies.).    [provider]  chlorhexidine  (PERIDEX ) 0.12 % solution Use as directed 10 mLs in the mouth or throat as needed.    [provider]  Cholecalciferol (VITAMIN D3) 1000 units CAPS Take 2 capsules by mouth daily.    [provider]  clotrimazole  (MYCELEX ) 10 MG troche Take 1 tablet (10 mg total) by mouth 5 (five) times daily. 06/24/23   Avram Lupita BRAVO, MD  COLLAGEN PO Take 2 Scoops by mouth daily. Mix with coffee (Collagen Peptides)    [provider]  estradiol (ESTRACE) 0.1 MG/GM vaginal cream Place 1 Applicatorful vaginally at bedtime. 06/11/23   [provider]  fluorouracil (EFUDEX) 5 % cream Apply 1 Application topically 2 (two) times daily as needed (skin lesions).    [provider]  FOSAMAX 70 MG tablet Take 70 mg by mouth once a week. 02/03/23   [provider]  hyoscyamine   (LEVSIN  SL) 0.125 MG SL tablet DISSOLVE 1 TABLET(0.125 MG) UNDER THE TONGUE EVERY 4 HOURS AS NEEDED FOR CRAMPING OR GAS OR BLOATING 07/30/22   Avram Lupita BRAVO, MD  METAMUCIL FIBER PO Take by mouth.    [provider]  metoprolol  succinate (TOPROL -XL) 25 MG 24 hr tablet Take 1 tablet (25 mg total) by mouth at bedtime. 10/12/22   Patwardhan, Manish J, MD  METRONIDAZOLE , TOPICAL, 0.75 % LOTN Apply 1 Application topically daily as needed (rosacea).    [provider]  Multiple Vitamin (MULTIVITAMIN) capsule Take 1 capsule by mouth daily.    [provider]  OVER THE COUNTER MEDICATION OTC: Gut Restore Ultimate Sig: 2 capsules every AM    [provider]  predniSONE (DELTASONE) 10 MG tablet Take 10 mg by mouth 3 (three) times daily. 03/08/23   [provider]  rosuvastatin (CRESTOR) 10 MG tablet Take 10 mg by mouth at bedtime. 03/03/21   [provider]  saccharomyces boulardii (FLORASTOR) 250 MG capsule Take 250 mg by mouth 2 (two) times daily.    [provider]  tretinoin (RETIN-A) 0.05 % cream Apply 1 application  topically daily as needed (rosacea.).    [provider]    Allergies: Patient has no known allergies.    Review of Systems  Respiratory:  Positive for shortness of breath. Negative for cough.   Cardiovascular:  Positive for leg swelling. Negative for chest pain.  Gastrointestinal:  Positive for abdominal distention. Negative for abdominal pain, nausea and vomiting.  Neurological:  Negative for light-headedness.    Updated Vital Signs BP 134/85 (BP Location: Right Arm)   Pulse 74   Temp 98.2 F (36.8 C) (Oral)   Resp 18   Ht 5' 2 (1.575 m)   Wt 79.8 kg   SpO2 100%   BMI 32.19 kg/m   Physical Exam Vitals and nursing note reviewed.  Constitutional:      General: She is not in acute distress.    Appearance: Normal appearance. She is not ill-appearing.  HENT:     Head: Normocephalic and atraumatic.      Nose: Nose normal.   Eyes:     Conjunctiva/sclera: Conjunctivae normal.    Cardiovascular:     Rate and Rhythm: Normal rate and regular rhythm.     Comments: JVP just above clavicle Pulmonary:     Effort: Pulmonary effort is normal. No respiratory distress.  Abdominal:     General: There is distension.     Tenderness: There is no abdominal tenderness. There is no guarding.   Musculoskeletal:        General: No deformity. Normal range of motion.     Cervical back: Normal range of motion.     Right lower leg: No edema.     Left lower leg: No edema.     Comments: No pitting edema bilaterally.   Skin:    Findings: No rash.   Neurological:     Mental Status: She is alert.     (  all labs ordered are listed, but only abnormal results are displayed) Labs Reviewed  COMPREHENSIVE METABOLIC PANEL WITH GFR - Abnormal; Notable for the following components:      Result Value   Glucose, Bld 105 (*)    All other components within normal limits  CBC WITH DIFFERENTIAL/PLATELET  PRO BRAIN NATRIURETIC PEPTIDE  MAGNESIUM  TROPONIN T, HIGH SENSITIVITY    EKG: None  Radiology: No results found.   Procedures   Medications Ordered in the ED - No data to display  Clinical Course as of 07/31/23 1946  Sat Jul 31, 2023  1919 CBC unremarkable, CMP without acute concern.  Preserved renal function, normal electrolytes.  proBNP of 348, initial troponin of 22.  No previous troponin to compare this to.  Magnesium within normal.  Given no previous troponin we will repeat this.  Will give dose of Lasix.  Patient ambulated around the department without desaturation or exertional dyspnea.  If repeat troponin normal will discharge with short course of Lasix until she follows up with cardiologist on Monday. [AA]    Clinical Course User Index [AA] Hildegard Loge, PA-C                                 Medical Decision Making Amount and/or Complexity of Data Reviewed Labs: ordered. Radiology:  ordered.  Risk OTC drugs. Prescription drug management.   Medical Decision Making / ED Course   This patient presents to the ED for concern of shortness of breath, abdominal distention, leg swelling, this involves an extensive number of treatment options, and is a complaint that carries with it a high risk of complications and morbidity.  The differential diagnosis includes CHF, dependent edema, ACS  MDM: 78 year old female presents today for above-mentioned complaints.  No prior history of CHF.  Recently did have an out patient BNP that was elevated.  Has a cardiology appointment on Monday. Since then she has developed abdominal distention.  Does endorse exertional dyspnea.  No orthopnea.  No pitting edema appreciated on exam.  Initially   Repeat troponin stable.  Low suspicion for ACS.  Will discharge with 5-day course of Lasix.  First dose given in the emergency department.  She will follow-up with cardiologist on Monday.  Strict return precaution discussed.  She has a GI procedure coming up which she is concerned about and does not want to proceed given the other health concerns she has going on at this time.  She is looking to seek her cardiologist opinion on this on Monday.  Additional history obtained: -Additional history obtained from daughter at bedside -External records from outside source obtained and reviewed including: Chart review including previous notes, labs, imaging, consultation notes   Lab Tests: -I ordered, reviewed, and interpreted labs.   The pertinent results include:   Labs Reviewed  COMPREHENSIVE METABOLIC PANEL WITH GFR - Abnormal; Notable for the following components:      Result Value   Glucose, Bld 105 (*)    All other components within normal limits  CBC WITH DIFFERENTIAL/PLATELET  PRO BRAIN NATRIURETIC PEPTIDE  MAGNESIUM  TROPONIN T, HIGH SENSITIVITY      EKG  EKG Interpretation Date/Time:    Ventricular Rate:    PR Interval:    QRS  Duration:    QT Interval:    QTC Calculation:   R Axis:      Text Interpretation:  Imaging Studies ordered: I ordered imaging studies including chest x-ray I independently visualized and interpreted imaging. I agree with the radiologist interpretation   Medicines ordered and prescription drug management: No orders of the defined types were placed in this encounter.   -I have reviewed the patients home medicines and have made adjustments as needed  Social Determinants of Health:  Factors impacting patients care include: Good family support, good outpatient follow-up   Reevaluation: After the interventions noted above, I reevaluated the patient and found that they have :improved  Co morbidities that complicate the patient evaluation  Past Medical History:  Diagnosis Date   Anemia    Ascending aorta dilatation (HCC)    Atrial dilatation    Bartholin cyst    Compression fracture    lumbar    Constipation    Disorder of bone density and structure, unspecified    Fatty liver    Gas bloat syndrome    Hearing loss    History of colon polyps    History of hiatal hernia    History of SCC (squamous cell carcinoma) of skin    Hypertension    Liver lesion    Mild cardiomegaly    Mitral regurgitation    Multinodular goiter    Obesity    Other iron deficiency anemia    Primary osteoarthritis    Pulmonary hypertension (HCC)    Pure hypercholesterolemia    Scoliosis    Sensory - neural hearing loss    right   Tremor    Tricuspid regurgitation    Vitamin D deficiency       Dispostion: Discharged in stable condition.  Return precaution discussed.  Patient voices understanding and is in agreement with plan.   Final diagnoses:  Elevated brain natriuretic peptide (BNP) level  Dyspnea on exertion    ED Discharge Orders          Ordered    furosemide (LASIX) 40 MG tablet  Daily        07/31/23 1946               Hildegard Loge, PA-C 07/31/23  1949    Tegeler, Lonni PARAS, MD 08/01/23 [phone]

## 2023-07-31 NOTE — Discharge Instructions (Addendum)
 Your workup was reassuring.  Slight signs of volume overload but chest x-ray did not show any signs of fluid on your lungs, and you ambulated around the department without any drop in your oxygen saturation or shortness of breath.  I have discharged you with short course of Lasix.  Your cardiologist will need to reevaluate and determine additional dosing of this.  Return if you develop significant shortness of breath or chest pain.

## 2023-07-31 NOTE — ED Triage Notes (Signed)
 Pt complaining of left leg swelling that started about 3 weeks ago after she was seen for dehydration. Was drinking 2 32 ounce bottles of gatorade. Now the swelling is in the lower abdomen.

## 2023-08-02 ENCOUNTER — Ambulatory Visit: Attending: Cardiology | Admitting: Cardiology

## 2023-08-02 ENCOUNTER — Telehealth: Payer: Self-pay | Admitting: Internal Medicine

## 2023-08-02 ENCOUNTER — Encounter: Payer: Self-pay | Admitting: Family Medicine

## 2023-08-02 ENCOUNTER — Encounter: Payer: Self-pay | Admitting: Cardiology

## 2023-08-02 ENCOUNTER — Telehealth: Payer: Self-pay | Admitting: *Deleted

## 2023-08-02 VITALS — BP 102/68 | HR 73 | Ht 64.5 in | Wt 183.0 lb

## 2023-08-02 DIAGNOSIS — I517 Cardiomegaly: Secondary | ICD-10-CM | POA: Insufficient documentation

## 2023-08-02 DIAGNOSIS — I2729 Other secondary pulmonary hypertension: Secondary | ICD-10-CM | POA: Insufficient documentation

## 2023-08-02 MED ORDER — FUROSEMIDE 20 MG PO TABS: 20.0000 mg | ORAL_TABLET | Freq: Every day | ORAL | 2 refills | Status: DC

## 2023-08-02 NOTE — Telephone Encounter (Signed)
 Reviewed EGD report, looks reassuring. Today's appt was made after recent urgent visit to ER showing elevated BNP. If patient has any shortness of breath, we will discuss that today.  Thanks MJP

## 2023-08-02 NOTE — H&P (View-Only) (Signed)
 Cardiology Office Note:  .   Date:  08/02/2023  ID:  [name], DOB 05/13/1945, MRN [id number] PCP: Hannah Prentice SAUNDERS, FNP  [facility] Providers Cardiologist:  Hannah Lawrence, MD PCP: Hannah Prentice SAUNDERS, FNP  Chief Complaint  Patient presents with   Hyperlipidemia   Leg Swelling     [name] is a 78 y.o. female with hyperlipidemia, chronic cough, s/p hiatal hernia repair, h/o perioperative DVT/PE (03/2022).   Patient was seen and droppage ER on 07/31/2023 with complaints of leg swelling and abdominal distention.  She had also reported some exertional dyspnea.  Her BNP was found to be elevated during her recent walk-in clinic.  Of note, she has not started on diuretic, but has history of dehydration and recurrent UTI in the recent past.  She was started on oral Lasix and was recommended cardiology follow-up.  She continues to have abdominal distention, although leg edema has improved.   Vitals:   08/02/23 1036  BP: 102/68  Pulse: 73  SpO2: 94%       Review of Systems  Cardiovascular:  Negative for chest pain, dyspnea on exertion, leg swelling, palpitations and syncope.        Studies Reviewed: SABRA        EKG 04/18/2023: Sinus rhythm 98 bpm and Inferior T wave inversion, consider ischemia  Independently interpreted (Ordered during ER visit) 07/2023: Hb 13.4 Cr 0.84 ProBNP 348, trop HS 22, 21   Echocardiogram 04/08/2023:  1. Left ventricular ejection fraction, by estimation, is 60 to 65%. Left  ventricular ejection fraction by 3D volume is 62 %. The left ventricle has  normal function. The left ventricle has no regional wall motion  abnormalities. Left ventricular diastolic   parameters are indeterminate.   2. Right ventricular systolic function is normal. The right ventricular  size is mildly enlarged. There is mildly elevated pulmonary artery  systolic pressure. The estimated right ventricular systolic pressure is  43.4 mmHg.   3. Left  atrial size was severely dilated.   4. Right atrial size was mildly dilated.   5. The mitral valve is normal in structure. Mild mitral valve  regurgitation. No evidence of mitral stenosis.   6. The tricuspid valve is abnormal. Tricuspid valve regurgitation is  moderate.   7. The aortic valve is tricuspid. Aortic valve regurgitation is not  visualized. No aortic stenosis is present.   8. The inferior vena cava is normal in size with greater than 50%  respiratory variability, suggesting right atrial pressure of 3 mmHg.   9. 5.3 x 4.9 cm liver cyst noted.   Risk Assessment/Calculations:    CHA2DS2-VASc Score = 4  This indicates a 4.8% annual risk of stroke. The patient's score is based upon: CHF History: 0 HTN History: 1 Diabetes History: 0 Stroke History: 0 Vascular Disease History: 0 Age Score: 2 Gender Score: 1    Physical Exam Vitals and nursing note reviewed.  Constitutional:      General: She is not in acute distress. Neck:     Vascular: No JVD.   Cardiovascular:     Rate and Rhythm: Normal rate and regular rhythm.     Heart sounds: Normal heart sounds. No murmur heard. Pulmonary:     Effort: Pulmonary effort is normal.     Breath sounds: Normal breath sounds. No wheezing or rales.   Musculoskeletal:     Right lower leg: No edema.     Left lower leg: No edema.  VISIT DIAGNOSES:   ICD-10-CM   1. Other secondary pulmonary hypertension (HCC)  I27.29 AMB referral to [facility]    2. Biatrial enlargement  I51.7        Assessment & Plan:  [name] is a 78 y.o. female with hyperlipidemia, chronic cough, hiatal hernia, h/o perioperative DVT/PE (03/2022).   Pulmonary hypertension: RVSP of 43 mmHg with moderate tricuspid regurgitation, mitral enlargement, now with symptoms of right-sided fluid overload. Continue Lasix, add 20 mg daily to avoid dehydration. (Patient did have significant UTI and dehydration recently). I will perform right heart  catheterization and then refer her to heart failure clinic for further evaluation and management of pulmonary hypertension.  Biatrial enlargement: Constellation of her echocardiographic findings do raise suspicion of PAF. Given that her LV geometry and appearance is normal, other causes of biatrial enlargement (such as amyloidosis) are less likely.  She has had two separate outpatient cardiac telemetry without noted Afib. Regardless, patient is now on Eliquis  5 mg bid since her seemingly provoked perioperative DVT/PE in 03/2022. In absence of any bleeding issues, I do think the benefits of anticoagulation outweighs the risks. Therefore, we mutually have decided to continue Eliquis  5 mg bid.  We also discussed performing cardiac MRI, patient was apprehensive given her prior history of stapedectomy and metal wire insertion in both years.  I will let heart failure clinic reviewed this further and to discuss and decide if MRI would be beneficial and safe.   Liver cyst:  Incidental finding on echocardiogram.  Recommend follow-up with PCP to consider liver ultrasound.    Signed, Hannah JINNY Lawrence, MD

## 2023-08-02 NOTE — Telephone Encounter (Signed)
 [name] called in to let us  know she has been to the ED again over the weekend and she is having a heart cath done 08/04/2023. She also has more heart work up appointments in July now. I have cancelled her 08/09/2023 EGD and will let Dr Avram know. She said she will be in touch when all these test are done.

## 2023-08-02 NOTE — Telephone Encounter (Signed)
In chart for review

## 2023-08-02 NOTE — Telephone Encounter (Signed)
 Patient contacted and advised. Will keep follow up appointment today.

## 2023-08-02 NOTE — Patient Instructions (Addendum)
 Medication Instructions:  DECREASE Lasix to 20 mg daily  *If you need a refill on your cardiac medications before your next appointment, please call your pharmacy*   Testing/Procedures: Your physician has requested that you have a cardiac catheterization. Cardiac catheterization is used to diagnose and/or treat various heart conditions. Doctors may recommend this procedure for a number of different reasons. The most common reason is to evaluate chest pain. Chest pain can be a symptom of coronary artery disease (CAD), and cardiac catheterization can show whether plaque is narrowing or blocking your heart's arteries. This procedure is also used to evaluate the valves, as well as measure the blood flow and oxygen levels in different parts of your heart. For further information please visit [email or web address] Please follow instruction sheet, as given.   REFERRAL TO HEART FAILURE CLINIC  Follow-Up: At [facility], you and your health needs are our priority.  As part of our continuing mission to provide you with exceptional heart care, our providers are all part of one team.  This team includes your primary Cardiologist (physician) and Advanced Practice Providers or APPs (Physician Assistants and Nurse Practitioners) who all work together to provide you with the care you need, when you need it.  Your next appointment:   2 weeks   Provider:   One of our Advanced Practice Providers (APPs): Morse Clause, PA-C  Lamarr Satterfield, NP Miriam Shams, NP  Olivia Pavy, PA-C Josefa Beauvais, NP  Leontine Salen, PA-C Orren Fabry, PA-C  [place], PA-C Ernest Dick, NP  Damien Braver, NP Jon Hails, PA-C  Waddell Donath, PA-C    Dayna Dunn, PA-C  Scott Weaver, PA-C Lum Louis, NP Katlyn West, NP Callie Goodrich, PA-C  Evan Williams, PA-C Sheng Haley, PA-C  Xika Zhao, NP Kathleen Johnson, PA-C   Then, Newman JINNY Lawrence, MD will plan to see you again in 3 month(s).     OTHER  INSTRUCTIONS  LEONOR DARNELL  08/02/2023  You are scheduled for a Cardiac Catheterization on Wednesday, July 2 with Dr. Newman Lawrence.  1. Please arrive at the [facility] (Main Entrance A) at [facility]: [address] [place], [place] [address] at 9:30 AM (This time is 2 hour(s) before your procedure to ensure your preparation).   Free valet parking service is available. You will check in at ADMITTING. The support person will be asked to wait in the waiting room.  It is OK to have someone drop you off and come back when you are ready to be discharged.    Special note: Every effort is made to have your procedure done on time. Please understand that emergencies sometimes delay scheduled procedures.  2. Diet: Do not eat solid foods after midnight.  The patient may have clear liquids until 5am upon the day of the procedure.  3. Labs: NONE completed 07/31/23  4. Medication instructions in preparation for your procedure:   Contrast Allergy: No  DO NOT TAKE anymore Eliquis  until told to do so after Heart Cath.   DO NOT TAKE Furosemide the morning of procedure.    On the morning of your procedure, take your Aspirin 81 mg and any morning medicines NOT listed above.  You may use sips of water.  5. Plan to go home the same day, you will only stay overnight if medically necessary. 6. Bring a current list of your medications and current insurance cards. 7. You MUST have a responsible person to drive you home. 8. Someone MUST be with  you the first 24 hours after you arrive home or your discharge will be delayed. 9. Please wear clothes that are easy to get on and off and wear slip-on shoes.  Thank you for allowing us  to care for you!   -- [facility]

## 2023-08-02 NOTE — Telephone Encounter (Signed)
 Patient called and stated that she would like to speak to PJ regarding her up coming procedure that is scheduled for July the 7 th. Patient is requesting that PJ return her call. Please advise.

## 2023-08-02 NOTE — Telephone Encounter (Signed)
 Right Heart Cath  scheduled at [facility] for:  Wednesday August 04, 2023 7:30 AM (time change per cath lab) Arrival time [facility] Main Entrance A at: 5:30 AM  Nothing to eat after midnight prior to procedure, clear liquids until 5 AM day of procedure.  Medication instructions: -Hold:  Eliquis -pt reports last dose AM 08/02/23-knows to hold until post procedure  Lasix -AM of procedure -Other usual morning medications can be taken with sips of water.  Plan to go home the same day, you will only stay overnight if medically necessary.  You must have responsible adult to drive you home.  Someone must be with you the first 24 hours after you arrive home.  Reviewed procedure instructions/procedure time change with patient.

## 2023-08-02 NOTE — Progress Notes (Signed)
 Cardiology Office Note:  .   Date:  08/02/2023  ID:  SUDIKSHA VICTOR, DOB 05/13/1945, MRN [id number] PCP: Marvene Prentice SAUNDERS, FNP  [facility] Providers Cardiologist:  Newman Lawrence, MD PCP: Marvene Prentice SAUNDERS, FNP  Chief Complaint  Patient presents with   Hyperlipidemia   Leg Swelling     Aerabella CHANNA HAZELETT is a 78 y.o. female with hyperlipidemia, chronic cough, s/p hiatal hernia repair, h/o perioperative DVT/PE (03/2022).   Patient was seen and droppage ER on 07/31/2023 with complaints of leg swelling and abdominal distention.  She had also reported some exertional dyspnea.  Her BNP was found to be elevated during her recent walk-in clinic.  Of note, she has not started on diuretic, but has history of dehydration and recurrent UTI in the recent past.  She was started on oral Lasix and was recommended cardiology follow-up.  She continues to have abdominal distention, although leg edema has improved.   Vitals:   08/02/23 1036  BP: 102/68  Pulse: 73  SpO2: 94%       Review of Systems  Cardiovascular:  Negative for chest pain, dyspnea on exertion, leg swelling, palpitations and syncope.        Studies Reviewed: SABRA        EKG 04/18/2023: Sinus rhythm 98 bpm and Inferior T wave inversion, consider ischemia  Independently interpreted (Ordered during ER visit) 07/2023: Hb 13.4 Cr 0.84 ProBNP 348, trop HS 22, 21   Echocardiogram 04/08/2023:  1. Left ventricular ejection fraction, by estimation, is 60 to 65%. Left  ventricular ejection fraction by 3D volume is 62 %. The left ventricle has  normal function. The left ventricle has no regional wall motion  abnormalities. Left ventricular diastolic   parameters are indeterminate.   2. Right ventricular systolic function is normal. The right ventricular  size is mildly enlarged. There is mildly elevated pulmonary artery  systolic pressure. The estimated right ventricular systolic pressure is  43.4 mmHg.   3. Left  atrial size was severely dilated.   4. Right atrial size was mildly dilated.   5. The mitral valve is normal in structure. Mild mitral valve  regurgitation. No evidence of mitral stenosis.   6. The tricuspid valve is abnormal. Tricuspid valve regurgitation is  moderate.   7. The aortic valve is tricuspid. Aortic valve regurgitation is not  visualized. No aortic stenosis is present.   8. The inferior vena cava is normal in size with greater than 50%  respiratory variability, suggesting right atrial pressure of 3 mmHg.   9. 5.3 x 4.9 cm liver cyst noted.   Risk Assessment/Calculations:    CHA2DS2-VASc Score = 4  This indicates a 4.8% annual risk of stroke. The patient's score is based upon: CHF History: 0 HTN History: 1 Diabetes History: 0 Stroke History: 0 Vascular Disease History: 0 Age Score: 2 Gender Score: 1    Physical Exam Vitals and nursing note reviewed.  Constitutional:      General: She is not in acute distress. Neck:     Vascular: No JVD.   Cardiovascular:     Rate and Rhythm: Normal rate and regular rhythm.     Heart sounds: Normal heart sounds. No murmur heard. Pulmonary:     Effort: Pulmonary effort is normal.     Breath sounds: Normal breath sounds. No wheezing or rales.   Musculoskeletal:     Right lower leg: No edema.     Left lower leg: No edema.  VISIT DIAGNOSES:   ICD-10-CM   1. Other secondary pulmonary hypertension (HCC)  I27.29 AMB referral to [facility]    2. Biatrial enlargement  I51.7        Assessment & Plan:  [name] is a 78 y.o. female with hyperlipidemia, chronic cough, hiatal hernia, h/o perioperative DVT/PE (03/2022).   Pulmonary hypertension: RVSP of 43 mmHg with moderate tricuspid regurgitation, mitral enlargement, now with symptoms of right-sided fluid overload. Continue Lasix, add 20 mg daily to avoid dehydration. (Patient did have significant UTI and dehydration recently). I will perform right heart  catheterization and then refer her to heart failure clinic for further evaluation and management of pulmonary hypertension.  Biatrial enlargement: Constellation of her echocardiographic findings do raise suspicion of PAF. Given that her LV geometry and appearance is normal, other causes of biatrial enlargement (such as amyloidosis) are less likely.  She has had two separate outpatient cardiac telemetry without noted Afib. Regardless, patient is now on Eliquis  5 mg bid since her seemingly provoked perioperative DVT/PE in 03/2022. In absence of any bleeding issues, I do think the benefits of anticoagulation outweighs the risks. Therefore, we mutually have decided to continue Eliquis  5 mg bid.  We also discussed performing cardiac MRI, patient was apprehensive given her prior history of stapedectomy and metal wire insertion in both years.  I will let heart failure clinic reviewed this further and to discuss and decide if MRI would be beneficial and safe.   Liver cyst:  Incidental finding on echocardiogram.  Recommend follow-up with PCP to consider liver ultrasound.    Signed, Newman JINNY Lawrence, MD

## 2023-08-04 ENCOUNTER — Encounter (HOSPITAL_COMMUNITY)
Admission: RE | Disposition: A | Discharge status: Home / Self Care | Payer: Self-pay | Source: Home / Self Care | Attending: Cardiology

## 2023-08-04 ENCOUNTER — Other Ambulatory Visit: Payer: Self-pay

## 2023-08-04 ENCOUNTER — Ambulatory Visit (HOSPITAL_COMMUNITY)
Admission: RE | Admit: 2023-08-04 | Discharge: 2023-08-04 | Disposition: A | Discharge status: Home / Self Care | Attending: Cardiology | Admitting: Cardiology

## 2023-08-04 DIAGNOSIS — I071 Rheumatic tricuspid insufficiency: Secondary | ICD-10-CM | POA: Diagnosis not present

## 2023-08-04 DIAGNOSIS — I517 Cardiomegaly: Secondary | ICD-10-CM | POA: Insufficient documentation

## 2023-08-04 DIAGNOSIS — K449 Diaphragmatic hernia without obstruction or gangrene: Secondary | ICD-10-CM | POA: Diagnosis not present

## 2023-08-04 DIAGNOSIS — R053 Chronic cough: Secondary | ICD-10-CM | POA: Insufficient documentation

## 2023-08-04 DIAGNOSIS — M7989 Other specified soft tissue disorders: Secondary | ICD-10-CM | POA: Insufficient documentation

## 2023-08-04 DIAGNOSIS — I272 Pulmonary hypertension, unspecified: Secondary | ICD-10-CM

## 2023-08-04 DIAGNOSIS — Z79899 Other long term (current) drug therapy: Secondary | ICD-10-CM | POA: Insufficient documentation

## 2023-08-04 DIAGNOSIS — E785 Hyperlipidemia, unspecified: Secondary | ICD-10-CM | POA: Diagnosis not present

## 2023-08-04 DIAGNOSIS — Z86718 Personal history of other venous thrombosis and embolism: Secondary | ICD-10-CM | POA: Insufficient documentation

## 2023-08-04 DIAGNOSIS — K7689 Other specified diseases of liver: Secondary | ICD-10-CM | POA: Diagnosis not present

## 2023-08-04 DIAGNOSIS — I2729 Other secondary pulmonary hypertension: Secondary | ICD-10-CM | POA: Insufficient documentation

## 2023-08-04 HISTORY — PX: RIGHT HEART CATH: CATH118263

## 2023-08-04 LAB — POCT I-STAT EG7
Acid-Base Excess: 1 mmol/L (ref 0.0–2.0)
Acid-Base Excess: 1 mmol/L (ref 0.0–2.0)
Bicarbonate: 25.6 mmol/L (ref 20.0–28.0)
Bicarbonate: 26.2 mmol/L (ref 20.0–28.0)
Calcium, Ion: 1.24 mmol/L (ref 1.15–1.40)
Calcium, Ion: 1.26 mmol/L (ref 1.15–1.40)
HCT: 34 % — ABNORMAL LOW (ref 36.0–46.0)
HCT: 35 % — ABNORMAL LOW (ref 36.0–46.0)
Hemoglobin: 11.6 g/dL — ABNORMAL LOW (ref 12.0–15.0)
Hemoglobin: 11.9 g/dL — ABNORMAL LOW (ref 12.0–15.0)
O2 Saturation: 72 %
O2 Saturation: 73 %
Potassium: 3.6 mmol/L (ref 3.5–5.1)
Potassium: 3.6 mmol/L (ref 3.5–5.1)
Sodium: 141 mmol/L (ref 135–145)
Sodium: 141 mmol/L (ref 135–145)
TCO2: 27 mmol/L (ref 22–32)
TCO2: 27 mmol/L (ref 22–32)
pCO2, Ven: 41 mmHg — ABNORMAL LOW (ref 44–60)
pCO2, Ven: 41.7 mmHg — ABNORMAL LOW (ref 44–60)
pH, Ven: 7.403 (ref 7.25–7.43)
pH, Ven: 7.406 (ref 7.25–7.43)
pO2, Ven: 38 mmHg (ref 32–45)
pO2, Ven: 38 mmHg (ref 32–45)

## 2023-08-04 SURGERY — RIGHT HEART CATH

## 2023-08-04 MED ORDER — LIDOCAINE HCL (PF) 1 % IJ SOLN
INTRAMUSCULAR | Status: DC | PRN
  Administered 2023-08-04: 2 mL via INTRADERMAL

## 2023-08-04 MED ORDER — SODIUM CHLORIDE 0.9 % IV SOLN: 250.0000 mL | INTRAVENOUS | Status: DC | PRN

## 2023-08-04 MED ORDER — ACETAMINOPHEN 325 MG PO TABS: 650.0000 mg | ORAL_TABLET | ORAL | Status: DC | PRN

## 2023-08-04 MED ORDER — SODIUM CHLORIDE 0.9% FLUSH: 3.0000 mL | INTRAVENOUS | Status: DC | PRN

## 2023-08-04 MED ORDER — LABETALOL HCL 5 MG/ML IV SOLN: 10.0000 mg | INTRAVENOUS | Status: DC | PRN

## 2023-08-04 MED ORDER — LIDOCAINE HCL (PF) 1 % IJ SOLN
INTRAMUSCULAR | Status: AC
  Filled 2023-08-04: qty 30

## 2023-08-04 MED ORDER — SODIUM CHLORIDE 0.9 % IV SOLN: INTRAVENOUS | Status: DC

## 2023-08-04 MED ORDER — HYDRALAZINE HCL 20 MG/ML IJ SOLN: 10.0000 mg | INTRAMUSCULAR | Status: DC | PRN

## 2023-08-04 MED ORDER — HEPARIN (PORCINE) IN NACL 1000-0.9 UT/500ML-% IV SOLN
INTRAVENOUS | Status: DC | PRN
Start: 2023-08-04 — End: 2023-08-04
  Administered 2023-08-04: 500 mL

## 2023-08-04 MED ORDER — SODIUM CHLORIDE 0.9% FLUSH: 3.0000 mL | Freq: Two times a day (BID) | INTRAVENOUS | Status: DC

## 2023-08-04 MED ORDER — ONDANSETRON HCL 4 MG/2ML IJ SOLN: 4.0000 mg | Freq: Four times a day (QID) | INTRAMUSCULAR | Status: DC | PRN

## 2023-08-04 SURGICAL SUPPLY — 2 items
CATH BALLN WEDGE 5F 110CM (CATHETERS) IMPLANT
SHEATH GLIDE SLENDER 4/5FR (SHEATH) IMPLANT

## 2023-08-04 NOTE — Discharge Instructions (Signed)

## 2023-08-04 NOTE — Interval H&P Note (Signed)
 History and Physical Interval Note:  08/04/2023 7:39 AM  [name]  has presented today for surgery, with the diagnosis of hp.  The various methods of treatment have been discussed with the patient and family. After consideration of risks, benefits and other options for treatment, the patient has consented to  Procedure(s): RIGHT HEART CATH (N/A) as a surgical intervention.  The patient's history has been reviewed, patient examined, no change in status, stable for surgery.  I have reviewed the patient's chart and labs.  Questions were answered to the patient's satisfaction.     Vincen Bejar J Heddy Vidana

## 2023-08-05 ENCOUNTER — Encounter (HOSPITAL_COMMUNITY): Payer: Self-pay | Admitting: Cardiology

## 2023-08-05 DIAGNOSIS — Z6831 Body mass index (BMI) 31.0-31.9, adult: Secondary | ICD-10-CM | POA: Diagnosis not present

## 2023-08-05 DIAGNOSIS — Z85828 Personal history of other malignant neoplasm of skin: Secondary | ICD-10-CM | POA: Diagnosis not present

## 2023-08-05 DIAGNOSIS — I272 Pulmonary hypertension, unspecified: Secondary | ICD-10-CM | POA: Diagnosis not present

## 2023-08-05 DIAGNOSIS — R21 Rash and other nonspecific skin eruption: Secondary | ICD-10-CM | POA: Diagnosis not present

## 2023-08-05 DIAGNOSIS — L309 Dermatitis, unspecified: Secondary | ICD-10-CM | POA: Diagnosis not present

## 2023-08-09 ENCOUNTER — Encounter: Admitting: Internal Medicine

## 2023-08-12 DIAGNOSIS — D0421 Carcinoma in situ of skin of right ear and external auricular canal: Secondary | ICD-10-CM | POA: Diagnosis not present

## 2023-08-12 DIAGNOSIS — L570 Actinic keratosis: Secondary | ICD-10-CM | POA: Diagnosis not present

## 2023-08-12 DIAGNOSIS — Z85828 Personal history of other malignant neoplasm of skin: Secondary | ICD-10-CM | POA: Diagnosis not present

## 2023-08-12 DIAGNOSIS — D485 Neoplasm of uncertain behavior of skin: Secondary | ICD-10-CM | POA: Diagnosis not present

## 2023-08-12 DIAGNOSIS — K130 Diseases of lips: Secondary | ICD-10-CM | POA: Diagnosis not present

## 2023-08-12 DIAGNOSIS — C44519 Basal cell carcinoma of skin of other part of trunk: Secondary | ICD-10-CM | POA: Diagnosis not present

## 2023-08-12 DIAGNOSIS — C44319 Basal cell carcinoma of skin of other parts of face: Secondary | ICD-10-CM | POA: Diagnosis not present

## 2023-08-12 DIAGNOSIS — C44612 Basal cell carcinoma of skin of right upper limb, including shoulder: Secondary | ICD-10-CM | POA: Diagnosis not present

## 2023-08-17 ENCOUNTER — Other Ambulatory Visit (HOSPITAL_COMMUNITY): Payer: Self-pay

## 2023-08-17 ENCOUNTER — Ambulatory Visit (HOSPITAL_COMMUNITY)
Admission: RE | Admit: 2023-08-17 | Discharge: 2023-08-17 | Disposition: A | Discharge status: Home / Self Care | Source: Ambulatory Visit | Attending: Cardiology | Admitting: Cardiology

## 2023-08-17 ENCOUNTER — Ambulatory Visit (HOSPITAL_COMMUNITY): Payer: Self-pay | Admitting: Cardiology

## 2023-08-17 ENCOUNTER — Encounter (HOSPITAL_COMMUNITY): Payer: Self-pay | Admitting: Cardiology

## 2023-08-17 VITALS — BP 130/78 | HR 77 | Ht 64.5 in | Wt 178.8 lb

## 2023-08-17 DIAGNOSIS — I5042 Chronic combined systolic (congestive) and diastolic (congestive) heart failure: Secondary | ICD-10-CM | POA: Diagnosis not present

## 2023-08-17 DIAGNOSIS — Z7901 Long term (current) use of anticoagulants: Secondary | ICD-10-CM | POA: Diagnosis not present

## 2023-08-17 DIAGNOSIS — I5032 Chronic diastolic (congestive) heart failure: Secondary | ICD-10-CM | POA: Diagnosis not present

## 2023-08-17 DIAGNOSIS — Z86718 Personal history of other venous thrombosis and embolism: Secondary | ICD-10-CM | POA: Insufficient documentation

## 2023-08-17 DIAGNOSIS — M25569 Pain in unspecified knee: Secondary | ICD-10-CM | POA: Insufficient documentation

## 2023-08-17 DIAGNOSIS — Z86711 Personal history of pulmonary embolism: Secondary | ICD-10-CM | POA: Insufficient documentation

## 2023-08-17 DIAGNOSIS — I272 Pulmonary hypertension, unspecified: Secondary | ICD-10-CM

## 2023-08-17 LAB — BASIC METABOLIC PANEL WITH GFR
Anion gap: 10 (ref 5–15)
BUN: 16 mg/dL (ref 8–23)
CO2: 27 mmol/L (ref 22–32)
Calcium: 9.7 mg/dL (ref 8.9–10.3)
Chloride: 104 mmol/L (ref 98–111)
Creatinine, Ser: 0.91 mg/dL (ref 0.44–1.00)
GFR, Estimated: >60 mL/min (ref >60)
Glucose, Bld: 111 mg/dL — ABNORMAL HIGH (ref 70–99)
Potassium: 4.1 mmol/L (ref 3.5–5.1)
Sodium: 141 mmol/L (ref 135–145)

## 2023-08-17 LAB — BRAIN NATRIURETIC PEPTIDE: B Natriuretic Peptide: 95.9 pg/mL (ref 0.0–100.0)

## 2023-08-17 MED ORDER — DAPAGLIFLOZIN PROPANEDIOL 10 MG PO TABS: 10.0000 mg | ORAL_TABLET | Freq: Every day | ORAL | 3 refills | Status: DC

## 2023-08-17 MED ORDER — FUROSEMIDE 20 MG PO TABS: 20.0000 mg | ORAL_TABLET | ORAL | 2 refills | Status: AC

## 2023-08-17 MED ORDER — DAPAGLIFLOZIN PROPANEDIOL 10 MG PO TABS: 10.0000 mg | ORAL_TABLET | Freq: Every day | ORAL | 0 refills | Status: DC

## 2023-08-17 NOTE — Progress Notes (Signed)
 PCP: Marvene Prentice SAUNDERS, FNP Cardiology: Dr. Elmira HF Cardiology: Dr. Rolan  78 y.o. with history of DVT/PE post-op in 2/24, pulmonary hypertension and diastolic CHF was referred by Dr. Elmira for evaluation of pulmonary hypertension. Patient had Nissen fundoplication in 2/24, post-op she had a DVT and PE.  She was started on apixaban  at that time and has remained on apixaban  since then.  Earlier this year, she developed a UTI and started drinking Gatorade as she was told she was dehydrated and hyponatremic after an ER visit in 3/25.  Echo was done in 3/25, showing EF 60-65%, mild RV enlargement with normal RV systolic function, PASP 43 mmHg, severe LAE, mild MR, moderate TR, normal IVC.  In 6/25, she was seen in the ER for peripheral edema and dyspnea, Lasix  20 mg daily was started. She saw Dr. Elmira and was set up for RHC in 7/25.  This showed normal filling pressures (she had been on Lasix  for several weeks) as well as mild pulmonary hypertension with relatively low PVR 2.6 WU.    Patient currently is primarily limited by knee pain.  She reports dyspnea after walking for 20 minutes or walking up 2 flights of stairs.  Generally doing fine with her normal ADLs.  She no longer as swollen ankles.  No lightheadedness or palpitations.  No chest pain.    ECG (personally reviewed): NSR, inferolateral TWIs  Labs (2/25): TSH normal Labs (6/25): BNP 156, K 4.5, creatinine 0.84  PMH: 1. DVT/PE: 2/24, s/p Nissen fundoplication surgery.  2. Hyperlipidemia 3. Diastolic CHF/pulmonary hypertension:  - Echo (3/25): EF 60-65%, mild RV enlargement with normal RV systolic function, PASP 43 mmHg, severe LAE, mild MR, moderate TR, normal IVC.  - RHC (7/25): mean RA 5, PA 43/12 mean 27, mean PCWP 13, CI 2.8, PVR 2.6 WU, PAPi 6.2 4. Cardiac monitors in 3/23 and 11/23 showed no atrial fibrillation  Family History  Problem Relation Age of Onset   Other Mother 21       lived to be 16   Colon cancer  Father    Clotting disorder Father    Deep vein thrombosis Father    Breast cancer Sister        mets   Social History   Socioeconomic History   Marital status: Married    Spouse name: Not on file   Number of children: 2   Years of education: Not on file   Highest education level: Not on file  Occupational History   Not on file  Tobacco Use   Smoking status: Never   Smokeless tobacco: Never  Vaping Use   Vaping status: Never Used  Substance and Sexual Activity   Alcohol  use: Yes    Comment: social, none  in the last 6 months   Drug use: No   Sexual activity: Not on file  Other Topics Concern   Not on file  Social History Narrative   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 : Never true  Transportation Needs: No Transportation Needs (05/26/2023)   Received from [facility]   Transportation    In the past 12 months, has lack of reliable transportation kept you from medical appointments, meetings, work or from getting things needed for daily living? : No  Physical Activity: Not on file  Stress: Not on file  Social Connections: Unknown (06/17/2021)   Received from [facility]   Social Network    Social Network: Not on file  Intimate Partner Violence: Not At Risk (03/03/2022)   Humiliation, Afraid, Rape, and Kick questionnaire    Fear of Current or Ex-Partner: No    Emotionally Abused: No    Physically Abused: No    Sexually Abused: No   ROS: All systems reviewed and negative except as per HPI.   Current Outpatient  Medications  Medication Sig Dispense Refill   acetaminophen  (TYLENOL ) 500 MG tablet Take 1,000 mg by mouth every 8 (eight) hours as needed (pain.).     AMBULATORY NON FORMULARY MEDICATION Apply 1 patch topically at bedtime. Melatonin patch 6 mg with magnesium     AMBULATORY NON FORMULARY MEDICATION Take 2 capsules by mouth daily. UT Fix     AMBULATORY NON FORMULARY MEDICATION Take 2 capsules by mouth at bedtime. Native Balance Magnesium with Ashwaganda     AMBULATORY NON FORMULARY MEDICATION Take 2 tablets by mouth daily. Fish Oil Ultra EDA-DHA 1300 mg     apixaban  (ELIQUIS ) 5 MG TABS tablet TAKE 1 TABLET TWICE A DAY 180 tablet 1   Brimonidine Tartrate (LUMIFY) 0.025 % SOLN Place 1 drop into both eyes 2 (two) times daily as needed (dry/irritated eyes.).     carboxymethylcellulose (REFRESH PLUS) 0.5 % SOLN Place 1 drop into both eyes 3 (three) times daily as needed (dry/irritated eyes.).     cetirizine (ZYRTEC) 10 MG tablet Take 10 mg by mouth daily as needed (seasonal allergies.).     Cholecalciferol (VITAMIN D3) 1000 units CAPS Take 2,000 Units by mouth daily.     COLLAGEN PO Take 2 Scoops by mouth daily. Mix with coffee (Collagen Peptides)     estradiol (ESTRACE) 0.1 MG/GM vaginal cream Place 1 Applicatorful vaginally at bedtime.     fluorouracil (EFUDEX) 5 % cream Apply 1 Application topically 2 (two) times daily as needed (skin lesions).     FOSAMAX 70 MG tablet Take 70 mg by mouth once a week.     hyoscyamine  (LEVSIN  SL) 0.125 MG SL tablet DISSOLVE 1 TABLET(0.125 MG) UNDER THE TONGUE EVERY 4 HOURS AS NEEDED FOR CRAMPING OR GAS OR BLOATING 60 tablet 5   METAMUCIL FIBER PO Take 10 mLs by mouth daily as needed.     metoprolol  succinate (TOPROL -XL) 25 MG 24 hr tablet Take 1 tablet (25 mg total) by mouth at bedtime. 90 tablet 3   mometasone (NASONEX) 50 MCG/ACT nasal spray Place 2 sprays into the nose daily as needed (Allergies).     Multiple Vitamin (MULTIVITAMIN) capsule Take 1 capsule by mouth  daily.     OVER THE COUNTER MEDICATION Take 2 capsules by mouth in the morning. OTC: Gut Restore Ultimate probiotic     rosuvastatin (CRESTOR) 10 MG tablet Take 10 mg by mouth at bedtime.     tretinoin (RETIN-A) 0.05 % cream Apply 1 application  topically daily as needed (rosacea.).     dapagliflozin  propanediol (FARXIGA ) 10 MG TABS tablet Take 1 tablet (10 mg total) by mouth daily  before breakfast. 90 tablet 0   furosemide  (LASIX ) 20 MG tablet Take 1 tablet (20 mg total) by mouth every other day. 90 tablet 2   No current facility-administered medications for this encounter.   BP 130/78   Pulse 77   Ht 5' 4.5 (1.638 m)   Wt 81.1 kg (178 lb 12.8 oz)   SpO2 97%   BMI 30.22 kg/m  General: NAD Neck: No JVD, no thyromegaly or thyroid  nodule.  Lungs: Clear to auscultation bilaterally with normal respiratory effort. CV: Nondisplaced PMI.  Heart regular S1/S2, no S3/S4, no murmur.  Trace ankle edema.  No carotid bruit.  Normal pedal pulses.  Abdomen: Soft, nontender, no hepatosplenomegaly, no distention.  Skin: Intact without lesions or rashes.  Neurologic: Alert and oriented x 3.  Psych: Normal affect. Extremities: No clubbing or cyanosis.  HEENT: Normal.   1. Pulmonary hypertension: Echo in 3/25 showed EF 60-65%, mild RV enlargement with normal RV systolic function, PASP 43 mmHg, severe LAE, mild MR, moderate TR, normal IVC. RHC in 7/25 showed normal filling pressures (mean PCWP 13) with mild pulmonary edema.  PVR was not significantly elevated at 2.6 WU, which would suggest most likely group 2 PH (pulmonary venous hypertension) in the setting of LV diastolic dysfunction.  The severely enlarged left atrium points towards significant LV diastolic dysfunction.  However, patient does have a history of PE/DVT in 2024, so need to rule out chronic thromboembolic pulmonary hypertension. She should also be screened for rheumatologic disorders and significant parenchymal lung disease.  CT abdomen in  6/25 showed no cirrhosis.  On exam, she does not appear volume overloaded.  - I will order V/Q scan to rule out chronic PE.  - I will order PFTs.  - Check BNP, ANA, anti-centromere and anti-SCL 70, RF.  - Decrease Lasix  to 20 mg every other day and start Farxiga  10 mg daily.  We may be able to stop Lasix  altogether with use of Farxiga .  She had a UTI earlier this year but has no history of yeast infections. Check BMET today.  - No plan for selective pulmonary vasodilators at this time, await workup as above.  2. H/o DVT/PE: This was post-surgery.  However, Eliquis  has been continued long term.  I think this is reasonable.  The severely enlarged LA is worrisome for AF risk (prior cardiac monitors have not showed AF) though could potentially be explained by severe diastolic dysfunction.  - I would agree with continuing Eliquis  long-term and will get V/Q scan as above to look for chronic PEs given mild pulmonary hypertension.   Followup in 2 months.   I spent 56 minutes reviewing all records, interviewing/examining patient, and managing orders.   Ezra Shuck 08/17/2023

## 2023-08-17 NOTE — Patient Instructions (Addendum)
 CHANGE Lasix  to 20 mg every other day.  START Farxiga  10 mg daily.  Labs done today, your results will be available in MyChart, we will contact you for abnormal readings.  Repeat blood work in 10 days.  Your physician has recommended that you have a pulmonary function test. Pulmonary Function Tests are a group of tests that measure how well air moves in and out of your lungs. You will be called to have this test arranged.  Your provider has ordered a Scan of your lungs. You will be called to have this test arranged.  Your physician recommends that you schedule a follow-up appointment in: 2 months.  If you have any questions or concerns before your next appointment please send us  a message through [place] or call our office at [phone].    TO LEAVE A MESSAGE FOR THE NURSE SELECT OPTION 2, PLEASE LEAVE A MESSAGE INCLUDING: YOUR NAME DATE OF BIRTH CALL BACK NUMBER REASON FOR CALL**this is important as we prioritize the call backs  YOU WILL RECEIVE A CALL BACK THE SAME DAY AS LONG AS YOU CALL BEFORE 4:00 PM  At the [facility], you and your health needs are our priority. As part of our continuing mission to provide you with exceptional heart care, we have created designated Provider Care Teams. These Care Teams include your primary Cardiologist (physician) and Advanced Practice Providers (APPs- Physician Assistants and Nurse Practitioners) who all work together to provide you with the care you need, when you need it.   You may see any of the following providers on your designated Care Team at your next follow up: Dr Toribio Fuel Dr Ezra Shuck Dr. Ria Commander Dr. Morene Brownie Amy Lenetta, NP Caffie Shed, [place] [facility] [place], [place] Beckey Coe, NP [place] Lee, NP Ellouise Class, NP Tinnie Redman, PharmD Jaun Bash, PharmD   Please be sure to bring in all your medications bottles to every appointment.    Thank you for choosing [facility]

## 2023-08-18 ENCOUNTER — Telehealth (HOSPITAL_COMMUNITY): Payer: Self-pay | Admitting: *Deleted

## 2023-08-18 LAB — RHEUMATOID FACTOR: Rheumatoid fact SerPl-aCnc: <10 [IU]/mL (ref <14.0)

## 2023-08-18 LAB — CENTROMERE ANTIBODIES: Centromere Ab Screen: <0.2 AI (ref 0.0–0.9)

## 2023-08-18 LAB — ANA W/REFLEX: Anti Nuclear Antibody (ANA): NEGATIVE

## 2023-08-18 LAB — ANTI-SCLERODERMA ANTIBODY: Scleroderma (Scl-70) (ENA) Antibody, IgG: <0.2 AI (ref 0.0–0.9)

## 2023-08-18 NOTE — Telephone Encounter (Signed)
Mychart mess sent to pt.

## 2023-08-18 NOTE — Telephone Encounter (Signed)
OK for injection

## 2023-08-18 NOTE — Telephone Encounter (Signed)
 Pt needs Kenalog (Triamcinolone ) knee injection and wants to make sure it's ok with her other meds, will send to provider for review

## 2023-08-19 ENCOUNTER — Ambulatory Visit: Admitting: Nurse Practitioner

## 2023-08-19 DIAGNOSIS — M170 Bilateral primary osteoarthritis of knee: Secondary | ICD-10-CM | POA: Diagnosis not present

## 2023-08-28 DIAGNOSIS — R300 Dysuria: Secondary | ICD-10-CM | POA: Diagnosis not present

## 2023-08-28 DIAGNOSIS — N3000 Acute cystitis without hematuria: Secondary | ICD-10-CM | POA: Diagnosis not present

## 2023-09-06 ENCOUNTER — Ambulatory Visit (HOSPITAL_COMMUNITY)
Admission: RE | Admit: 2023-09-06 | Discharge: 2023-09-06 | Disposition: A | Discharge status: Home / Self Care | Source: Ambulatory Visit | Attending: Cardiology | Admitting: Cardiology

## 2023-09-06 ENCOUNTER — Encounter (HOSPITAL_COMMUNITY): Payer: Self-pay

## 2023-09-06 ENCOUNTER — Encounter (HOSPITAL_COMMUNITY)
Admission: RE | Admit: 2023-09-06 | Discharge: 2023-09-06 | Disposition: A | Discharge status: Home / Self Care | Source: Ambulatory Visit | Attending: Cardiology | Admitting: Cardiology

## 2023-09-06 DIAGNOSIS — I5042 Chronic combined systolic (congestive) and diastolic (congestive) heart failure: Secondary | ICD-10-CM

## 2023-09-06 LAB — BASIC METABOLIC PANEL WITH GFR
Anion gap: 9 (ref 5–15)
BUN: 18 mg/dL (ref 8–23)
CO2: 24 mmol/L (ref 22–32)
Calcium: 9.6 mg/dL (ref 8.9–10.3)
Chloride: 107 mmol/L (ref 98–111)
Creatinine, Ser: 1.28 mg/dL — ABNORMAL HIGH (ref 0.44–1.00)
GFR, Estimated: 42 mL/min — ABNORMAL LOW (ref >60)
Glucose, Bld: 105 mg/dL — ABNORMAL HIGH (ref 70–99)
Potassium: 4.1 mmol/L (ref 3.5–5.1)
Sodium: 140 mmol/L (ref 135–145)

## 2023-09-06 MED ORDER — TECHNETIUM TO 99M ALBUMIN AGGREGATED
4.4000 | Freq: Once | INTRAVENOUS | Status: AC | PRN
  Administered 2023-09-06: 4.4 via INTRAVENOUS

## 2023-09-08 ENCOUNTER — Ambulatory Visit (HOSPITAL_COMMUNITY)
Admission: RE | Admit: 2023-09-08 | Discharge: 2023-09-08 | Disposition: A | Discharge status: Home / Self Care | Source: Ambulatory Visit | Attending: Cardiology | Admitting: Cardiology

## 2023-09-08 DIAGNOSIS — H906 Mixed conductive and sensorineural hearing loss, bilateral: Secondary | ICD-10-CM | POA: Diagnosis not present

## 2023-09-08 DIAGNOSIS — I5042 Chronic combined systolic (congestive) and diastolic (congestive) heart failure: Secondary | ICD-10-CM | POA: Insufficient documentation

## 2023-09-08 LAB — PULMONARY FUNCTION TEST
DL/VA % pred: 78 %
DL/VA: 3.21 ml/min/mmHg/L
DLCO unc % pred: 68 %
DLCO unc: 12.96 ml/min/mmHg
FEF 25-75 Pre: 2.31 L/s
FEF2575-%Pred-Pre: 164 %
FEV1-%Pred-Pre: 115 %
FEV1-Pre: 2.24 L
FEV1FVC-%Pred-Pre: 108 %
FEV6-%Pred-Pre: 111 %
FEV6-Pre: 2.74 L
FEV6FVC-%Pred-Pre: 104 %
FVC-%Pred-Pre: 106 %
Pre FEV1/FVC ratio: 80 %
Pre FEV6/FVC Ratio: 99 %
RV % pred: 71 %
RV: 1.7 L
TLC % pred: 86 %
TLC: 4.37 L

## 2023-09-10 DIAGNOSIS — N952 Postmenopausal atrophic vaginitis: Secondary | ICD-10-CM | POA: Diagnosis not present

## 2023-09-10 DIAGNOSIS — N898 Other specified noninflammatory disorders of vagina: Secondary | ICD-10-CM | POA: Diagnosis not present

## 2023-09-10 DIAGNOSIS — N390 Urinary tract infection, site not specified: Secondary | ICD-10-CM | POA: Diagnosis not present

## 2023-09-13 DIAGNOSIS — C44319 Basal cell carcinoma of skin of other parts of face: Secondary | ICD-10-CM | POA: Diagnosis not present

## 2023-09-14 DIAGNOSIS — I872 Venous insufficiency (chronic) (peripheral): Secondary | ICD-10-CM | POA: Diagnosis not present

## 2023-09-14 DIAGNOSIS — H00015 Hordeolum externum left lower eyelid: Secondary | ICD-10-CM | POA: Diagnosis not present

## 2023-09-14 DIAGNOSIS — Z6830 Body mass index (BMI) 30.0-30.9, adult: Secondary | ICD-10-CM | POA: Diagnosis not present

## 2023-09-14 DIAGNOSIS — N644 Mastodynia: Secondary | ICD-10-CM | POA: Diagnosis not present

## 2023-09-14 DIAGNOSIS — N390 Urinary tract infection, site not specified: Secondary | ICD-10-CM | POA: Diagnosis not present

## 2023-09-15 ENCOUNTER — Other Ambulatory Visit: Payer: Self-pay | Admitting: Internal Medicine

## 2023-09-21 ENCOUNTER — Other Ambulatory Visit: Payer: Self-pay | Admitting: Cardiology

## 2023-09-21 DIAGNOSIS — I2699 Other pulmonary embolism without acute cor pulmonale: Secondary | ICD-10-CM

## 2023-09-22 NOTE — Telephone Encounter (Signed)
 Prescription refill request for Eliquis  received. Indication: PE Last office visit: 08/17/23  Hannah Shuck MD Scr: 1.28 on 09/06/23  Epic Age: 78 Weight: 81.1kg  Based on above findings Eliquis  5mg  twice daily is the appropriate dose.  Refill approved.

## 2023-09-27 DIAGNOSIS — N644 Mastodynia: Secondary | ICD-10-CM | POA: Diagnosis not present

## 2023-09-28 DIAGNOSIS — M170 Bilateral primary osteoarthritis of knee: Secondary | ICD-10-CM | POA: Diagnosis not present

## 2023-10-13 ENCOUNTER — Telehealth (HOSPITAL_COMMUNITY): Payer: Self-pay

## 2023-10-13 ENCOUNTER — Encounter (HOSPITAL_COMMUNITY): Payer: Self-pay | Admitting: Cardiology

## 2023-10-13 ENCOUNTER — Ambulatory Visit (HOSPITAL_COMMUNITY)
Admission: RE | Admit: 2023-10-13 | Discharge: 2023-10-13 | Disposition: A | Discharge status: Home / Self Care | Source: Ambulatory Visit | Attending: Cardiology | Admitting: Cardiology

## 2023-10-13 ENCOUNTER — Other Ambulatory Visit (HOSPITAL_COMMUNITY): Payer: Self-pay

## 2023-10-13 VITALS — BP 120/78 | HR 64 | Wt 173.4 lb

## 2023-10-13 DIAGNOSIS — Z7984 Long term (current) use of oral hypoglycemic drugs: Secondary | ICD-10-CM | POA: Insufficient documentation

## 2023-10-13 DIAGNOSIS — I272 Pulmonary hypertension, unspecified: Secondary | ICD-10-CM | POA: Insufficient documentation

## 2023-10-13 DIAGNOSIS — I5032 Chronic diastolic (congestive) heart failure: Secondary | ICD-10-CM | POA: Diagnosis present

## 2023-10-13 DIAGNOSIS — I498 Other specified cardiac arrhythmias: Secondary | ICD-10-CM | POA: Diagnosis not present

## 2023-10-13 DIAGNOSIS — Z86718 Personal history of other venous thrombosis and embolism: Secondary | ICD-10-CM | POA: Insufficient documentation

## 2023-10-13 DIAGNOSIS — Z86711 Personal history of pulmonary embolism: Secondary | ICD-10-CM | POA: Diagnosis present

## 2023-10-13 DIAGNOSIS — I5042 Chronic combined systolic (congestive) and diastolic (congestive) heart failure: Secondary | ICD-10-CM | POA: Insufficient documentation

## 2023-10-13 DIAGNOSIS — Z7901 Long term (current) use of anticoagulants: Secondary | ICD-10-CM | POA: Diagnosis not present

## 2023-10-13 MED ORDER — KERENDIA 10 MG PO TABS
10.0000 mg | ORAL_TABLET | Freq: Every day | ORAL | 11 refills | Status: DC
  Filled 2023-10-13: qty 30, 30d supply, fill #0
  Filled 2023-11-02 – 2023-11-08 (×2): qty 30, 30d supply, fill #1

## 2023-10-13 MED ORDER — KERENDIA 10 MG PO TABS: 10.0000 mg | ORAL_TABLET | Freq: Every day | ORAL | 11 refills | Status: DC

## 2023-10-13 NOTE — Progress Notes (Signed)
 PCP: Marvene Prentice SAUNDERS, FNP Cardiology: Dr. Elmira HF Cardiology: Dr. Rolan  Chief complaint: CHF  78 y.o. with history of DVT/PE post-op in 2/24, pulmonary hypertension and diastolic CHF was referred by Dr. Elmira for evaluation of pulmonary hypertension. Patient had Nissen fundoplication in 2/24, post-op she had a DVT and PE.  She was started on apixaban  at that time and has remained on apixaban  since then.  Earlier this year, she developed a UTI and started drinking Gatorade as she was told she was dehydrated and hyponatremic after an ER visit in 3/25.  Echo was done in 3/25, showing EF 60-65%, mild RV enlargement with normal RV systolic function, PASP 43 mmHg, severe LAE, mild MR, moderate TR, normal IVC.  In 6/25, she was seen in the ER for peripheral edema and dyspnea, Lasix  20 mg daily was started. She saw Dr. Elmira and was set up for RHC in 7/25.  This showed normal filling pressures (she had been on Lasix  for several weeks) as well as mild pulmonary hypertension with relatively low PVR 2.6 WU.    She returns for followup of CHF.  Weight down 5 lbs. Breathing is better with diuresis.  She is short of breath walking several times up and down stairs.  No dyspnea walking on flat ground.  She can walk up to a mile.  No orthopnea/PND.  No lightheadedness.  No chest pain.  She has tingling/burning in her feet concerning for peripheral neuropathy.   ECG (personally reviewed): Suspect ectopic atrial rhythm with short PR  Labs (2/25): TSH normal Labs (6/25): BNP 156, K 4.5, creatinine 0.84 Labs (7/25): BNP 96, ANA negative, anti-centromere Ab negative, anti-SCL-70 negative, RF negative Labs (8/25): K 4.1, creatinine 1.28  PMH: 1. DVT/PE: 2/24, s/p Nissen fundoplication surgery.  - V/Q scan (8/25): No chronic PEs 2. Hyperlipidemia 3. Diastolic CHF/pulmonary hypertension:  - Echo (3/25): EF 60-65%, mild RV enlargement with normal RV systolic function, PASP 43 mmHg, severe LAE, mild MR,  moderate TR, normal IVC.  - RHC (7/25): mean RA 5, PA 43/12 mean 27, mean PCWP 13, CI 2.8, PVR 2.6 WU, PAPi 6.2 4. Cardiac monitors in 3/23 and 11/23 showed no atrial fibrillation 5. PFTs (8/25): normal except for decreased DLCO.   Family History  Problem Relation Age of Onset   Other Mother 71       lived to be 30   Colon cancer Father    Clotting disorder Father    Deep vein thrombosis Father    Breast cancer Sister        mets   Social History   Socioeconomic History   Marital status: Married    Spouse name: Not on file   Number of children: 2   Years of education: Not on file   Highest education level: Not on file  Occupational History   Not on file  Tobacco Use   Smoking status: Never   Smokeless tobacco: Never  Vaping Use   Vaping status: Never Used  Substance and Sexual Activity   Alcohol  use: Yes    Comment: social, none  in the last 6 months   Drug use: No   Sexual activity: Not on file  Other Topics Concern   Not on file  Social History Narrative   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 : Never true  Transportation Needs: No Transportation Needs (05/26/2023)   Received from [facility]    In the past 12 months, has lack of reliable transportation kept you from medical appointments, meetings, work or from getting things needed for daily living? : No  Physical Activity: Not on file  Stress: Not on file  Social Connections: Unknown (06/17/2021)   Received from [facility]   Social Network     Social Network: Not on file  Intimate Partner Violence: Not At Risk (03/03/2022)   Humiliation, Afraid, Rape, and Kick questionnaire    Fear of Current or Ex-Partner: No    Emotionally Abused: No    Physically Abused: No    Sexually Abused: No   ROS: All systems reviewed and negative except as per HPI.   Current Outpatient Medications  Medication Sig Dispense Refill   acetaminophen  (TYLENOL ) 500 MG tablet Take 1,000 mg by mouth every 8 (eight) hours as needed (pain.).     AMBULATORY NON FORMULARY MEDICATION Apply 1 patch topically at bedtime. Melatonin patch 6 mg with magnesium     AMBULATORY NON FORMULARY MEDICATION Take 2 capsules by mouth daily. UT Fix     AMBULATORY NON FORMULARY MEDICATION Take 2 capsules by mouth at bedtime. Native Balance Magnesium with Ashwaganda     AMBULATORY NON FORMULARY MEDICATION Take 2 tablets by mouth daily. Fish Oil Ultra EDA-DHA 1300 mg     apixaban  (ELIQUIS ) 5 MG TABS tablet TAKE 1 TABLET TWICE A DAY 180 tablet 1   Brimonidine Tartrate (LUMIFY) 0.025 % SOLN Place 1 drop into both eyes 2 (two) times daily as needed (dry/irritated eyes.).     carboxymethylcellulose (REFRESH PLUS) 0.5 % SOLN Place 1 drop into both eyes 3 (three) times daily as needed (dry/irritated eyes.).     cetirizine (ZYRTEC) 10 MG tablet Take 10 mg by mouth daily as needed (seasonal allergies.).     Cholecalciferol (VITAMIN D3) 1000 units CAPS Take 2,000 Units by mouth daily.     COLLAGEN PO Take 2 Scoops by mouth daily. Mix with coffee (Collagen Peptides)     dapagliflozin  propanediol (FARXIGA ) 10 MG TABS tablet Take 1 tablet (10 mg total) by mouth daily before breakfast. 90 tablet 0   estradiol (ESTRACE) 0.1 MG/GM vaginal cream Place 1 Applicatorful vaginally at bedtime.     fluorouracil (EFUDEX) 5 % cream Apply 1 Application topically 2 (two) times daily as needed (skin lesions).     FOSAMAX 70 MG tablet Take 70 mg by mouth once a week.     furosemide  (LASIX ) 20 MG tablet Take 1  tablet (20 mg total) by mouth every other day. 90 tablet 2   hyoscyamine  (LEVSIN  SL) 0.125 MG SL tablet DISSOLVE 1 TABLET(0.125 MG) UNDER THE TONGUE EVERY 4 HOURS AS NEEDED FOR CRAMPING OR GAS OR BLOATING 60 tablet 5   METAMUCIL FIBER PO Take 10 mLs by mouth daily as needed.     metoprolol  succinate (TOPROL -XL) 25 MG 24 hr tablet Take 1 tablet (25 mg total) by mouth at bedtime. 90 tablet 3   mometasone (NASONEX)  50 MCG/ACT nasal spray Place 2 sprays into the nose daily as needed (Allergies).     Multiple Vitamin (MULTIVITAMIN) capsule Take 1 capsule by mouth daily.     OVER THE COUNTER MEDICATION Take 2 capsules by mouth in the morning. OTC: Gut Restore Ultimate probiotic     rosuvastatin (CRESTOR) 10 MG tablet Take 10 mg by mouth at bedtime.     tretinoin (RETIN-A) 0.05 % cream Apply 1 application  topically daily as needed (rosacea.).     Finerenone  (KERENDIA ) 10 MG TABS Take 1 tablet (10 mg total) by mouth daily. 30 tablet 11   No current facility-administered medications for this encounter.   BP 120/78   Pulse 64   Wt 78.7 kg (173 lb 6.4 oz)   SpO2 96%   BMI 29.30 kg/m  General: NAD Neck: No JVD, no thyromegaly or thyroid  nodule.  Lungs: Clear to auscultation bilaterally with normal respiratory effort. CV: Nondisplaced PMI.  Heart regular S1/S2, no S3/S4, no murmur.  Trace ankle edema.  No carotid bruit.  Normal pedal pulses.  Abdomen: Soft, nontender, no hepatosplenomegaly, no distention.  Skin: Intact without lesions or rashes.  Neurologic: Alert and oriented x 3.  Psych: Normal affect. Extremities: No clubbing or cyanosis.  HEENT: Normal.   1. Diastolic CHF: Echo in 3/25 showed EF 60-65%, mild RV enlargement with normal RV systolic function, PASP 43 mmHg, severe LAE, mild MR, moderate TR, normal IVC. RHC in 7/25 after diuresis showed normal filling pressures (mean PCWP 13) with mild pulmonary hypertension.  PVR was not significantly elevated at 2.6 WU, which would suggest most  likely group 2 PH (pulmonary venous hypertension) in the setting of LV diastolic dysfunction.  The severely enlarged left atrium points towards significant LV diastolic dysfunction. No evidence for chronic PEs on 8/25 V/Q scan.  NYHA class II, not volume overloaded on exam.  Weight has trended down.  - Continue Lasix  20 mg every other day.  - Continue Farxiga  10 mg daily.  - Add finerenone  10 mg daily with BMET/BNP today and BMET in 10 days.  - With diastolic CHF and no history of poorly controlled HTN or definite AF, I think that is is reasonable to rule out cardiac amyloidosis. She also has symptoms consistent with mild peripheral neuropathy. I will send myeloma studies and arrange for PYP scan.  2. H/o DVT/PE: This was post-surgery.  However, Eliquis  has been continued long term.  I think this is reasonable.  The severely enlarged LA is worrisome for AF risk (prior cardiac monitors have not showed AF) though could potentially be explained by severe diastolic dysfunction. No chronic PE on V/Q scan in 8/25.  - I would agree with continuing Eliquis  long-term 3. Ectopic atrial rhythm: Noted on ECG today with short PR.  Stable rhythm.  Continue to follow.   Followup in 3 months with APP.   I spent 31 minutes reviewing all records, interviewing/examining patient, and managing orders.   Ezra Shuck 10/13/2023

## 2023-10-13 NOTE — Patient Instructions (Signed)
 START Kerendia  10 mg daily.  Labs done today, your results will be available in MyChart, we will contact you for abnormal readings.  Repeat blood work in 10 days.  You have been ordered a PYP Scan.  [facility] done at [facility] [address], [place], [address]  When you come for this test please plan to be there 2-3 hours.  Your physician recommends that you schedule a follow-up appointment in: 3 months.  If you have any questions or concerns before your next appointment please send us  a message through [place] or call our office at [phone].    TO LEAVE A MESSAGE FOR THE NURSE SELECT OPTION 2, PLEASE LEAVE A MESSAGE INCLUDING: YOUR NAME DATE OF BIRTH CALL BACK NUMBER REASON FOR CALL**this is important as we prioritize the call backs  YOU WILL RECEIVE A CALL BACK THE SAME DAY AS LONG AS YOU CALL BEFORE 4:00 PM  At the [facility], you and your health needs are our priority. As part of our continuing mission to provide you with exceptional heart care, we have created designated Provider Care Teams. These Care Teams include your primary Cardiologist (physician) and Advanced Practice Providers (APPs- Physician Assistants and Nurse Practitioners) who all work together to provide you with the care you need, when you need it.   You may see any of the following providers on your designated Care Team at your next follow up: Dr Toribio Fuel Dr Ezra Shuck Dr. Ria Commander Dr. Morene Brownie Amy Lenetta, NP Caffie Shed, [place] [facility] [place], [place] Beckey Coe, NP [place] Lee, NP Ellouise Class, NP Tinnie Redman, PharmD Jaun Bash, PharmD   Please be sure to bring in all your medications bottles to every appointment.    Thank you for choosing [facility]

## 2023-10-14 ENCOUNTER — Ambulatory Visit (HOSPITAL_COMMUNITY): Payer: Self-pay | Admitting: Cardiology

## 2023-10-14 DIAGNOSIS — D472 Monoclonal gammopathy: Secondary | ICD-10-CM

## 2023-10-14 LAB — KAPPA/LAMBDA LIGHT CHAINS
Kappa free light chain: 12.8 mg/L (ref 3.3–19.4)
Kappa, lambda light chain ratio: 1.27 (ref 0.26–1.65)
Lambda free light chains: 10.1 mg/L (ref 5.7–26.3)

## 2023-10-15 LAB — IMMUNOFIXATION, URINE

## 2023-10-15 NOTE — Telephone Encounter (Signed)
 Advanced [facility] Patient Advocate Encounter  Prior authorization for Kerendia  [facility]) denied due to diagnosis not covered. Kerendia  was recently approved for use in new indications. Appeal faxed on 09/12. Will continue to follow up.  Rachel DEL, CPhT Rx Patient Advocate Phone: [phone]

## 2023-10-18 ENCOUNTER — Other Ambulatory Visit (HOSPITAL_COMMUNITY): Payer: Self-pay

## 2023-10-18 LAB — MULTIPLE MYELOMA PANEL, SERUM
Albumin SerPl Elph-Mcnc: 3.5 g/dL (ref 2.9–4.4)
Albumin/Glob SerPl: 1.5 (ref 0.7–1.7)
Alpha 1: 0.2 g/dL (ref 0.0–0.4)
Alpha2 Glob SerPl Elph-Mcnc: 0.7 g/dL (ref 0.4–1.0)
B-Globulin SerPl Elph-Mcnc: 0.8 g/dL (ref 0.7–1.3)
Gamma Glob SerPl Elph-Mcnc: 0.7 g/dL (ref 0.4–1.8)
Globulin, Total: 2.4 g/dL (ref 2.2–3.9)
IgA: 97 mg/dL (ref 64–422)
IgG (Immunoglobin G), Serum: 662 mg/dL (ref 586–1602)
IgM (Immunoglobulin M), Srm: 59 mg/dL (ref 26–217)
Total Protein ELP: 5.9 g/dL — ABNORMAL LOW (ref 6.0–8.5)

## 2023-10-18 NOTE — Telephone Encounter (Signed)
 Prior authorization for Kerendia  has been approved. Test billing returns $0 for 90 day supply.  Effective: 10/15/2023 to 10/14/2024  Rachel DEL, CPhT Rx Patient Advocate Phone: [phone]

## 2023-10-28 ENCOUNTER — Encounter (HOSPITAL_COMMUNITY): Payer: Self-pay | Admitting: *Deleted

## 2023-10-29 NOTE — Progress Notes (Signed)
 Referral received 10/21/23 From Dr Mclean for MGUS. Appointment scheduled for 11/10/23 0820.

## 2023-11-01 ENCOUNTER — Ambulatory Visit (HOSPITAL_COMMUNITY)
Admission: RE | Admit: 2023-11-01 | Discharge: 2023-11-01 | Disposition: A | Source: Ambulatory Visit | Attending: Cardiology

## 2023-11-01 DIAGNOSIS — I5042 Chronic combined systolic (congestive) and diastolic (congestive) heart failure: Secondary | ICD-10-CM | POA: Insufficient documentation

## 2023-11-01 LAB — BASIC METABOLIC PANEL WITH GFR
Anion gap: 9 (ref 5–15)
BUN: 20 mg/dL (ref 8–23)
CO2: 25 mmol/L (ref 22–32)
Calcium: 9.6 mg/dL (ref 8.9–10.3)
Chloride: 105 mmol/L (ref 98–111)
Creatinine, Ser: 0.86 mg/dL (ref 0.44–1.00)
GFR, Estimated: >60 mL/min (ref >60)
Glucose, Bld: 100 mg/dL — ABNORMAL HIGH (ref 70–99)
Potassium: 4.1 mmol/L (ref 3.5–5.1)
Sodium: 139 mmol/L (ref 135–145)

## 2023-11-01 LAB — BRAIN NATRIURETIC PEPTIDE: B Natriuretic Peptide: 126.7 pg/mL — ABNORMAL HIGH (ref 0.0–100.0)

## 2023-11-02 ENCOUNTER — Other Ambulatory Visit (HOSPITAL_COMMUNITY): Payer: Self-pay

## 2023-11-02 ENCOUNTER — Encounter: Payer: Self-pay | Admitting: Cardiology

## 2023-11-02 ENCOUNTER — Ambulatory Visit: Attending: Cardiology | Admitting: Cardiology

## 2023-11-02 VITALS — BP 118/72 | HR 72 | Ht 64.0 in | Wt 169.0 lb

## 2023-11-02 DIAGNOSIS — I2729 Other secondary pulmonary hypertension: Secondary | ICD-10-CM | POA: Diagnosis not present

## 2023-11-02 DIAGNOSIS — I517 Cardiomegaly: Secondary | ICD-10-CM | POA: Diagnosis not present

## 2023-11-02 NOTE — Progress Notes (Signed)
 Cardiology Office Note:  .   Date:  11/02/2023  ID:  [name], DOB 1945-10-20, MRN [id number] PCP: Marvene Prentice SAUNDERS, FNP  [facility] Providers Cardiologist:  Newman Lawrence, MD PCP: Marvene Prentice SAUNDERS, FNP  Chief Complaint  Patient presents with   Pulmonary hypertension     [name] is a 78 y.o. female with hyperlipidemia, chronic cough, s/p hiatal hernia repair, h/o perioperative DVT/PE (03/2022), severe biatrial dilatation, pulmonary hypertension, likely WHO grp II  Patient has been seen by Dr. Rolan, being worked up for etiology of her severe mitral digitation, including amyloidosis.  She has a PYP scan coming up.  She has been doing well on Farxiga , Lasix , as well as Kerendia .  Select swelling is improved.   Vitals:   11/02/23 0844  BP: 118/72  Pulse: 72  SpO2: 95%       Review of Systems  Cardiovascular:  Negative for chest pain, dyspnea on exertion, leg swelling, palpitations and syncope.        Studies Reviewed: SABRA        EKG 04/18/2023: Sinus rhythm 98 bpm and Inferior T wave inversion, consider ischemia  Labs 07/2023: Hb 13.4 Cr 0.84 ProBNP 348, trop HS 22, 21  Right heart catheterization 08/04/2023: RA: 5 mmHg RV: 42/0 mmHg PA: 43/12 mmHg, mPAP 27 mmHg PCW: 13 mmHg   AO sats: 98% PA sats: 72%   CO: 5.3 L/min CI: 2.8 L/min/m2   Mild pulmonary hypertension     Echocardiogram 04/08/2023:  1. Left ventricular ejection fraction, by estimation, is 60 to 65%. Left  ventricular ejection fraction by 3D volume is 62 %. The left ventricle has  normal function. The left ventricle has no regional wall motion  abnormalities. Left ventricular diastolic   parameters are indeterminate.   2. Right ventricular systolic function is normal. The right ventricular  size is mildly enlarged. There is mildly elevated pulmonary artery  systolic pressure. The estimated right ventricular systolic pressure is  43.4 mmHg.   3. Left atrial size  was severely dilated.   4. Right atrial size was mildly dilated.   5. The mitral valve is normal in structure. Mild mitral valve  regurgitation. No evidence of mitral stenosis.   6. The tricuspid valve is abnormal. Tricuspid valve regurgitation is  moderate.   7. The aortic valve is tricuspid. Aortic valve regurgitation is not  visualized. No aortic stenosis is present.   8. The inferior vena cava is normal in size with greater than 50%  respiratory variability, suggesting right atrial pressure of 3 mmHg.   9. 5.3 x 4.9 cm liver cyst noted.   Risk Assessment/Calculations:    CHA2DS2-VASc Score = 4  This indicates a 4.8% annual risk of stroke. The patient's score is based upon: CHF History: 0 HTN History: 1 Diabetes History: 0 Stroke History: 0 Vascular Disease History: 0 Age Score: 2 Gender Score: 1    Physical Exam Vitals and nursing note reviewed.  Constitutional:      General: She is not in acute distress. Neck:     Vascular: No JVD.  Cardiovascular:     Rate and Rhythm: Normal rate and regular rhythm.     Heart sounds: Normal heart sounds. No murmur heard. Pulmonary:     Effort: Pulmonary effort is normal.     Breath sounds: Normal breath sounds. No wheezing or rales.  Musculoskeletal:     Right lower leg: No edema.     Left lower leg: No  edema.      VISIT DIAGNOSES:   ICD-10-CM   1. Other secondary pulmonary hypertension (HCC)  I27.29     2. Biatrial enlargement  I51.7         Assessment & Plan:  [name] is a 78 y.o. female withhyperlipidemia, chronic cough, s/p hiatal hernia repair, h/o perioperative DVT/PE (03/2022), severe biatrial dilatation, pulmonary hypertension, likely WHO grp II   Pulmonary hypertension: Mean PA pressure 27 mmHg. With severe biatrial dilatation, group 2 pulmonary hypertension still likely etiology. Upcoming PYP scan to evaluate for amyloidosis. Continue Farxiga , Lasix , currently as per heart failure  recommendations.  Biatrial enlargement: Constellation of her echocardiographic findings do raise suspicion of PAF. Given that her LV geometry and appearance is normal, other causes of biatrial enlargement (such as amyloidosis) are less likely, but cannot be excluded.  She has upcoming PYP scan. She has had two separate outpatient cardiac telemetry without noted Afib. Regardless, patient is now on Eliquis  5 mg bid since her seemingly provoked perioperative DVT/PE in 03/2022. In absence of any bleeding issues, I do think the benefits of anticoagulation outweighs the risks. Therefore, we mutually have decided to continue Eliquis  5 mg bid.  Liver cyst:  Incidental finding on echocardiogram.  Recommend follow-up with PCP to consider liver ultrasound.  F/u in 02/2024  Signed, Newman JINNY Lawrence, MD

## 2023-11-02 NOTE — Patient Instructions (Signed)
  Follow-Up: At [facility], you and your health needs are our priority.  As part of our continuing mission to provide you with exceptional heart care, our providers are all part of one team.  This team includes your primary Cardiologist (physician) and Advanced Practice Providers or APPs (Physician Assistants and Nurse Practitioners) who all work together to provide you with the care you need, when you need it.  Your next appointment:   02/2024   Provider:   Newman JINNY Lawrence, MD

## 2023-11-03 ENCOUNTER — Other Ambulatory Visit (HOSPITAL_COMMUNITY): Payer: Self-pay | Admitting: Cardiology

## 2023-11-03 DIAGNOSIS — I5042 Chronic combined systolic (congestive) and diastolic (congestive) heart failure: Secondary | ICD-10-CM

## 2023-11-04 ENCOUNTER — Ambulatory Visit (HOSPITAL_COMMUNITY)
Admission: RE | Admit: 2023-11-04 | Discharge: 2023-11-04 | Disposition: A | Discharge status: Home / Self Care | Source: Ambulatory Visit | Attending: Cardiology | Admitting: Cardiology

## 2023-11-04 DIAGNOSIS — I700 Atherosclerosis of aorta: Secondary | ICD-10-CM | POA: Diagnosis not present

## 2023-11-04 DIAGNOSIS — I5042 Chronic combined systolic (congestive) and diastolic (congestive) heart failure: Secondary | ICD-10-CM | POA: Diagnosis not present

## 2023-11-04 MED ORDER — TECHNETIUM TC 99M PYROPHOSPHATE
20.6000 | Freq: Once | INTRAVENOUS | Status: AC
  Administered 2023-11-04: 20.6 via INTRAVENOUS

## 2023-11-05 LAB — MYOCARDIAL AMYLOID PLANAR & SPECT: H/CL Ratio: 1.2

## 2023-11-08 ENCOUNTER — Other Ambulatory Visit (HOSPITAL_COMMUNITY): Payer: Self-pay

## 2023-11-08 ENCOUNTER — Telehealth (HOSPITAL_COMMUNITY): Payer: Self-pay | Admitting: Cardiology

## 2023-11-08 NOTE — Telephone Encounter (Addendum)
 Pt aware   During the call patient reports she is still retaining fluid -reports hands are swollen, rings tight -stomach is bloated   Reports home weight is stable is 169 No SOB No BLE NO cough or SOB   Reports days when she takes farxiga  and lasix  she feels better, fluid is low

## 2023-11-08 NOTE — Telephone Encounter (Signed)
 Would recommend stopping Kerendia  and adding to allergy list. OTC hydrocortisone cream and/or benadryl may be more effective once causative agent is stopped. If rash does not improve or resolve after ~1 week, would have her reach out to PCP.

## 2023-11-08 NOTE — Telephone Encounter (Signed)
 Patient called to report flat, itchy, red rash since starting kerendia . Reports it started at R side of neck but has spread to back and neck. Reports she has taken for at least x 1 month. No relief with OTC anti itch cream  Reports she has stopped this weekend and would like to know what to do now. Please advise    Patient also called to request PYP results  Results given

## 2023-11-09 ENCOUNTER — Ambulatory Visit (HOSPITAL_COMMUNITY): Payer: Self-pay | Admitting: Cardiology

## 2023-11-09 ENCOUNTER — Other Ambulatory Visit (HOSPITAL_COMMUNITY): Payer: Self-pay

## 2023-11-09 NOTE — Progress Notes (Signed)
 [facility] [phone]   [phone]  INITIAL CONSULT NOTE  Patient Care Team: Marvene Prentice SAUNDERS, FNP as PCP - General (Family Medicine) Elmira Newman PARAS, MD as PCP - Cardiology (Cardiology) Rolan Ezra RAMAN, MD as PCP - Advanced Heart Failure (Cardiology) Elana Montie CROME, RN as Oncology Nurse Navigator  Hematological/Oncological History 10/13/2023: Urine immunofixation shows bence jones protein positive; kappa type. SPEP/IFE was negative M protein. Kappa/light chains are normal.  11/10/2023: Establish care with [facility]  CHIEF COMPLAINTS/PURPOSE OF CONSULTATION:  Urine Bence Jones Protein, Kappa Type   HISTORY OF PRESENTING ILLNESS:  [name] 78 y.o. female with medical history significant for scoliosis, hypercholesterolemia, osteoarthritis, hypertension, mitral regurgitation, pulmonary embolism and fatty liver presents to the hematology clinic for evaluation for monoclonal gammopathy.  She is unaccompanied for this visit.  On exam today, [name] reports her energy levels are at her baseline.  She exercises regularly and completes her daily activities on her own.  She has a good appetite without any recent weight changes.  She denies nausea, vomiting or abdominal pain.  Her bowel habits are unchanged without recurrent episodes of diarrhea or constipation.  She denies overt signs of bleeding including hematochezia or melena.  Patient has chronic low back pain and bilateral arthritic knee pain.  Otherwise she has no new bone/back pain.  Patient denies fevers, chills, night sweats, shortness of breath, chest pain or cough.  She has no other complaints.  Rest of the 10 point ROS as below.  MEDICAL HISTORY:  Past Medical History:  Diagnosis Date   Anemia    Ascending aorta dilatation    Atrial dilatation    Bartholin cyst    Compression fracture    lumbar    Constipation    Disorder of bone density and structure, unspecified     Fatty liver    Gas bloat syndrome    Hearing loss    History of colon polyps    History of hiatal hernia    History of SCC (squamous cell carcinoma) of skin    Hypertension    Liver lesion    Mild cardiomegaly    Mitral regurgitation    Multinodular goiter    Obesity    Other iron deficiency anemia    Primary osteoarthritis    Pulmonary hypertension (HCC)    Pure hypercholesterolemia    Scoliosis    Sensory - neural hearing loss    right   Tremor    Tricuspid regurgitation    Vitamin D deficiency     SURGICAL HISTORY: Past Surgical History:  Procedure Laterality Date   APPENDECTOMY     BONE ANCHORED HEARING AID IMPLANT Right 10/10/2014   Procedure: BONE ANCHORED HEARING AID (BAHA) IMPLANT TEMPORAL BONE RIGHT EAR;  Surgeon: Camellia Milliner, MD;  Location: [facility];  Service: ENT;  Laterality: Right;   BREAST REDUCTION SURGERY     CARPAL TUNNEL RELEASE Right    CATARACT EXTRACTION Bilateral    RIGHT HEART CATH N/A 08/04/2023   Procedure: RIGHT HEART CATH;  Surgeon: Elmira Newman PARAS, MD;  Location: [facility];  Service: Cardiovascular;  Laterality: N/A;   ROTATOR CUFF REPAIR Right    STAPEDECTOMY Bilateral    has metal implants (1 in each ear) - pt cannot have a MRI of her head/brain   UMBILICAL HERNIA REPAIR     XI ROBOTIC ASSISTED HIATAL HERNIA REPAIR N/A 03/03/2022   Procedure: XI ROBOTIC ASSISTED HIATAL HERNIA REPAIR  WITH MESH AND FUNDOPLICATION;  Surgeon: Rubin Calamity, MD;  Location: [facility];  Service: General;  Laterality: N/A;    SOCIAL HISTORY: Social History   Socioeconomic History   Marital status: Married    Spouse name: Not on file   Number of children: 2   Years of education: Not on file   Highest education level: Not on file  Occupational History   Not on file  Tobacco Use   Smoking status: Never   Smokeless tobacco: Never  Vaping Use   Vaping status: Never Used  Substance and Sexual Activity   Alcohol  use: Yes     Comment: social, none  in the last 6 months   Drug use: No   Sexual activity: Not on file  Other Topics Concern   Not on file  Social History Narrative   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 : Never true  Transportation Needs: No Transportation Needs (05/26/2023)   Received from [facility]    In the past 12 months, has lack of reliable transportation kept you from medical appointments, meetings, work or from getting things needed for daily living? : No  Physical Activity: Not on file  Stress: Not on file  Social Connections: Unknown (06/17/2021)   Received from [facility]   Social Network    Social Network: Not on file  Intimate Partner Violence: Not At Risk (03/03/2022)   Humiliation, Afraid, Rape, and Kick questionnaire    Fear of Current or Ex-Partner: No    Emotionally Abused: No    Physically Abused: No    Sexually Abused: No    FAMILY HISTORY: Family History  Problem Relation Age of Onset   Other Mother 38       lived to be 31   Colon cancer Father    Clotting disorder Father    Deep vein thrombosis Father    Breast cancer Sister        mets    ALLERGIES:  is allergic to kerendia  [finerenone ].  MEDICATIONS:  Current Outpatient Medications  Medication Sig Dispense Refill   acetaminophen  (TYLENOL ) 500 MG tablet Take 1,000 mg by mouth every 8 (eight) hours as needed (pain.).     AMBULATORY NON FORMULARY MEDICATION Apply 1 patch topically at bedtime. Melatonin patch 6 mg with magnesium      AMBULATORY NON FORMULARY MEDICATION Take 2 capsules by mouth daily. UT Fix     AMBULATORY NON FORMULARY MEDICATION Take 2 tablets by mouth daily. Fish Oil Ultra EDA-DHA 1300 mg     apixaban  (ELIQUIS ) 5 MG TABS tablet TAKE 1 TABLET TWICE A DAY 180 tablet 1   Brimonidine Tartrate (LUMIFY) 0.025 % SOLN Place 1 drop into both eyes 2 (two) times daily as needed (dry/irritated eyes.).     carboxymethylcellulose (REFRESH PLUS) 0.5 % SOLN Place 1 drop into both eyes 3 (three) times daily as needed (dry/irritated eyes.).  cetirizine (ZYRTEC) 10 MG tablet Take 10 mg by mouth daily as needed (seasonal allergies.).     Cholecalciferol (VITAMIN D3) 1000 units CAPS Take 2,000 Units by mouth daily.     COLLAGEN PO Take 2 Scoops by mouth daily. Mix with coffee (Collagen Peptides)     dapagliflozin  propanediol (FARXIGA ) 10 MG TABS tablet Take 1 tablet (10 mg total) by mouth daily before breakfast. 90 tablet 0   estradiol (ESTRACE) 0.1 MG/GM vaginal cream Place 1 Applicatorful vaginally at bedtime.     fluorouracil (EFUDEX) 5 % cream Apply 1 Application topically 2 (two) times daily as needed (skin lesions).     FOSAMAX 70 MG tablet Take 70 mg by mouth once a week.     furosemide  (LASIX ) 20 MG tablet Take 1 tablet (20 mg total) by mouth every other day. 90 tablet 2   hyoscyamine  (LEVSIN  SL) 0.125 MG SL tablet DISSOLVE 1 TABLET(0.125 MG) UNDER THE TONGUE EVERY 4 HOURS AS NEEDED FOR CRAMPING OR GAS OR BLOATING 60 tablet 5   METAMUCIL FIBER PO Take 10 mLs by mouth daily as needed.     metoprolol  succinate (TOPROL -XL) 25 MG 24 hr tablet Take 1 tablet (25 mg total) by mouth at bedtime. 90 tablet 3   mometasone (NASONEX) 50 MCG/ACT nasal spray Place 2 sprays into the nose daily as needed (Allergies).     Multiple Vitamin (MULTIVITAMIN) capsule Take 1 capsule by mouth daily.     OVER THE COUNTER MEDICATION Take 2 capsules by mouth in the morning. OTC: Gut Restore Ultimate probiotic     rosuvastatin (CRESTOR) 10 MG  tablet Take 10 mg by mouth at bedtime.     tretinoin (RETIN-A) 0.05 % cream Apply 1 application  topically daily as needed (rosacea.).     No current facility-administered medications for this visit.    REVIEW OF SYSTEMS:   Constitutional: ( - ) fevers, ( - )  chills , ( - ) night sweats Eyes: ( - ) blurriness of vision, ( - ) double vision, ( - ) watery eyes Ears, nose, mouth, throat, and face: ( - ) mucositis, ( - ) sore throat Respiratory: ( - ) cough, ( - ) dyspnea, ( - ) wheezes Cardiovascular: ( - ) palpitation, ( - ) chest discomfort, ( - ) lower extremity swelling Gastrointestinal:  ( - ) nausea, ( - ) heartburn, ( - ) change in bowel habits Skin: ( - ) abnormal skin rashes Lymphatics: ( - ) new lymphadenopathy, ( - ) easy bruising Neurological: ( - ) numbness, ( - ) tingling, ( - ) new weaknesses Behavioral/Psych: ( - ) mood change, ( - ) new changes  All other systems were reviewed with the patient and are negative.  PHYSICAL EXAMINATION: ECOG PERFORMANCE STATUS: 0 - Asymptomatic  There were no vitals filed for this visit. There were no vitals filed for this visit.  GENERAL: well appearing female in NAD  SKIN: skin color, texture, turgor are normal, no rashes or significant lesions EYES: conjunctiva are pink and non-injected, sclera clear OROPHARYNX: no exudate, no erythema; lips, buccal mucosa, and tongue normal  NECK: supple, non-tender LYMPH:  no palpable lymphadenopathy in the cervical or supraclavicular lymph nodes.  LUNGS: clear to auscultation and percussion with normal breathing effort HEART: regular rate & rhythm and no murmurs and no lower extremity edema.  Bilateral venous stasis erythema noted in her ankles feet. Musculoskeletal: no cyanosis of digits and no clubbing  PSYCH: alert & oriented x  3, fluent speech NEURO: no focal motor/sensory deficits  LABORATORY DATA:  I have reviewed the data as listed    Latest Ref Rng & Units 08/04/2023    7:51 AM  07/31/2023    1:30 PM 04/18/2023   10:00 PM  CBC  WBC 4.0 - 10.5 K/uL  4.8  12.3   Hemoglobin 12.0 - 15.0 g/dL 87.9 - 84.9 g/dL 88.0    88.3  86.5  86.2   Hematocrit 36.0 - 46.0 % 36.0 - 46.0 % 35.0    34.0  40.5  40.0   Platelets 150 - 400 K/uL  303  239        Latest Ref Rng & Units 11/01/2023    9:12 AM 09/06/2023    8:43 AM 08/17/2023   10:41 AM  CMP  Glucose 70 - 99 mg/dL 899  894  888   BUN 8 - 23 mg/dL 20  18  16    Creatinine 0.44 - 1.00 mg/dL 9.13  8.71  9.08   Sodium 135 - 145 mmol/L 139  140  141   Potassium 3.5 - 5.1 mmol/L 4.1  4.1  4.1   Chloride 98 - 111 mmol/L 105  107  104   CO2 22 - 32 mmol/L 25  24  27    Calcium 8.9 - 10.3 mg/dL 9.6  9.6  9.7      RADIOGRAPHIC STUDIES: I have personally reviewed the radiological images as listed and agreed with the findings in the report. MYOCARDIAL AMYLOID/CT RAD READ Result Date: 11/09/2023 CLINICAL DATA:  This over-read does not include interpretation of cardiac or coronary anatomy or pathology. The cardiac SPECT CT interpretation by the cardiologist is attached. COMPARISON:  CT March 04, 2022 FINDINGS: Vascular: Aortic atherosclerosis.  Coronary artery calcifications. Mediastinum/Nodes: Prominent mediastinal lymph nodes favored reactive. Lungs/Pleura: Motion degraded examination of the lungs reveals no suspicious pulmonary nodules or masses. Upper Abdomen: Cyst in the left lobe of the liver. Musculoskeletal: Thoracic spondylosis. IMPRESSION: 1. No acute extracardiac abnormality. 2. Prominent mediastinal lymph nodes favored reactive. 3. Aortic atherosclerosis. Aortic Atherosclerosis (ICD10-I70.0). Electronically Signed   By: Reyes Holder M.D.   On: 11/09/2023 12:46   MYOCARDIAL AMYLOID IMAGING PLANAR AND SPECT Result Date: 11/05/2023   Findings are not suggestive of cardiac ATTR amyloidosis. The myocardium was negative for radiotracer uptake.   The visual grade of myocardial uptake relative to the ribs was Grade 0 (No  myocardial uptake and normal bone uptake).   Coronary calcium was present on the attenuation correction CT images. Moderate coronary calcifications were present. Coronary calcifications were present in the left anterior descending artery, left circumflex artery and right coronary artery distribution(s).  Aortic atherosclerosis.   Prior study not available for comparison.    ASSESSMENT & PLAN [name] is a 78 y.o. female who presents to the hematology clinic for evaluation for monoclonal gammopathy.  Based on initial urine immunofixation, there is evidence of Bence-Jones proteins, kappa type. Recommend full MGUS workup with CBC, CMP, multiple myeloma panel, kappa/lambda light chain, UPEP, beta 2 microglobulin and LDH levels. Bone Survey will be obtained to evaluate for lytic lesions. Plan to follow up with results by phone. If no additional intervention is required, we will plan to see patient back in clinic in 6 months.    #Monoclonal gammopathy, positive bence jones proteins-kappa type: --Recommend further workup with CBC, CMP, multiple myeloma panel, kappa/lambda light chain, UPEP,beta 2 microglobulin and LDH levels.  --Need to obtain DG bone met surgery  to evaluate for lytic lesions.  --Will consider bone marrow biopsy based on above workup --RTC in 6 months with labs unless further intervention is required.    No orders of the defined types were placed in this encounter.   All questions were answered. The patient knows to call the clinic with any problems, questions or concerns.  I have spent a total of 60 minutes minutes of face-to-face and non-face-to-face time, preparing to see the patient, obtaining and/or reviewing separately obtained history, performing a medically appropriate examination, counseling and educating the patient, ordering tests, documenting clinical information in the electronic health record, independently interpreting results and communicating results to the  patient, and care coordination.   Johnston Police, PA-C Department of Hematology/Oncology [facility] at [facility] Phone: [phone]  Patient was seen with Dr. Federico.   I have read the above note and personally examined the patient. I agree with the assessment and plan as noted above.  Briefly Mrs. [name] is an 78 year old female who presents for evaluation of Bence-Jones protein in her urine.  This is a common finding and monoclonal dermopathy of undetermined significance.  Today we will conduct a full MGUS workup to include SPEP, UPEP, and serum free light chains.  Additionally would recommend a skeletal survey.  In the event that she is found to have high risk features would recommend pursuing a bone marrow biopsy.  Initial findings do appear quite faint, low suspicion this represents true multiple myeloma.  The patient notes that she has not had any bubbling or foaming of her urine.  Overall her health has been stable.  She voices understanding of our plan moving forward.   Hannah IVAR Federico, MD Department of Hematology/Oncology [facility] at [facility] Phone: [phone] Pager: [phone] Email: [email or web address]

## 2023-11-09 NOTE — Telephone Encounter (Signed)
 Patient advised and verbalized understanding

## 2023-11-10 ENCOUNTER — Encounter: Payer: Self-pay | Admitting: Physician Assistant

## 2023-11-10 ENCOUNTER — Inpatient Hospital Stay: Attending: Physician Assistant | Admitting: Physician Assistant

## 2023-11-10 ENCOUNTER — Other Ambulatory Visit: Payer: Self-pay

## 2023-11-10 ENCOUNTER — Inpatient Hospital Stay

## 2023-11-10 DIAGNOSIS — Z7901 Long term (current) use of anticoagulants: Secondary | ICD-10-CM | POA: Diagnosis not present

## 2023-11-10 DIAGNOSIS — Z86711 Personal history of pulmonary embolism: Secondary | ICD-10-CM | POA: Insufficient documentation

## 2023-11-10 DIAGNOSIS — D472 Monoclonal gammopathy: Secondary | ICD-10-CM | POA: Diagnosis not present

## 2023-11-10 DIAGNOSIS — Z800 Family history of malignant neoplasm of digestive organs: Secondary | ICD-10-CM | POA: Insufficient documentation

## 2023-11-10 DIAGNOSIS — Z803 Family history of malignant neoplasm of breast: Secondary | ICD-10-CM | POA: Insufficient documentation

## 2023-11-10 DIAGNOSIS — Z79899 Other long term (current) drug therapy: Secondary | ICD-10-CM | POA: Insufficient documentation

## 2023-11-10 DIAGNOSIS — I10 Essential (primary) hypertension: Secondary | ICD-10-CM | POA: Diagnosis not present

## 2023-11-10 DIAGNOSIS — Z7984 Long term (current) use of oral hypoglycemic drugs: Secondary | ICD-10-CM | POA: Diagnosis not present

## 2023-11-10 DIAGNOSIS — Z7983 Long term (current) use of bisphosphonates: Secondary | ICD-10-CM | POA: Insufficient documentation

## 2023-11-10 DIAGNOSIS — E7800 Pure hypercholesterolemia, unspecified: Secondary | ICD-10-CM | POA: Insufficient documentation

## 2023-11-10 DIAGNOSIS — Z7951 Long term (current) use of inhaled steroids: Secondary | ICD-10-CM | POA: Diagnosis not present

## 2023-11-10 LAB — CMP (CANCER CENTER ONLY)
ALT: 18 U/L (ref 0–44)
AST: 36 U/L (ref 15–41)
Albumin: 4.4 g/dL (ref 3.5–5.0)
Alkaline Phosphatase: 41 U/L (ref 38–126)
Anion gap: 5 (ref 5–15)
BUN: 18 mg/dL (ref 8–23)
CO2: 30 mmol/L (ref 22–32)
Calcium: 10 mg/dL (ref 8.9–10.3)
Chloride: 106 mmol/L (ref 98–111)
Creatinine: 0.8 mg/dL (ref 0.44–1.00)
GFR, Estimated: >60 mL/min (ref >60)
Glucose, Bld: 102 mg/dL — ABNORMAL HIGH (ref 70–99)
Potassium: 3.6 mmol/L (ref 3.5–5.1)
Sodium: 141 mmol/L (ref 135–145)
Total Bilirubin: 0.8 mg/dL (ref 0.0–1.2)
Total Protein: 7.1 g/dL (ref 6.5–8.1)

## 2023-11-10 LAB — CBC WITH DIFFERENTIAL (CANCER CENTER ONLY)
Abs Immature Granulocytes: 0.01 K/uL (ref 0.00–0.07)
Basophils Absolute: 0 K/uL (ref 0.0–0.1)
Basophils Relative: 0 %
Eosinophils Absolute: 0 K/uL (ref 0.0–0.5)
Eosinophils Relative: 1 %
HCT: 39.7 % (ref 36.0–46.0)
Hemoglobin: 13.7 g/dL (ref 12.0–15.0)
Immature Granulocytes: 0 %
Lymphocytes Relative: 18 %
Lymphs Abs: 0.7 K/uL (ref 0.7–4.0)
MCH: 31.9 pg (ref 26.0–34.0)
MCHC: 34.5 g/dL (ref 30.0–36.0)
MCV: 92.3 fL (ref 80.0–100.0)
Monocytes Absolute: 0.4 K/uL (ref 0.1–1.0)
Monocytes Relative: 10 %
Neutro Abs: 2.9 K/uL (ref 1.7–7.7)
Neutrophils Relative %: 71 %
Platelet Count: 265 K/uL (ref 150–400)
RBC: 4.3 MIL/uL (ref 3.87–5.11)
RDW: 12.4 % (ref 11.5–15.5)
WBC Count: 4.1 K/uL (ref 4.0–10.5)
nRBC: 0 % (ref 0.0–0.2)

## 2023-11-10 LAB — LACTATE DEHYDROGENASE: LDH: 192 U/L (ref 98–192)

## 2023-11-10 NOTE — Progress Notes (Signed)
 Appointment with Johnston. Pt will have bone scan and UPEP and depending on results, may need BM. If not we see in 6 months.

## 2023-11-11 ENCOUNTER — Telehealth: Payer: Self-pay | Admitting: Physician Assistant

## 2023-11-11 LAB — KAPPA/LAMBDA LIGHT CHAINS
Kappa free light chain: 15.3 mg/L (ref 3.3–19.4)
Kappa, lambda light chain ratio: 1.34 (ref 0.26–1.65)
Lambda free light chains: 11.4 mg/L (ref 5.7–26.3)

## 2023-11-11 LAB — BETA 2 MICROGLOBULIN, SERUM: Beta-2 Microglobulin: 2.1 mg/L (ref 0.6–2.4)

## 2023-11-11 NOTE — Telephone Encounter (Signed)
 Scheduled appointments with the patient per LOS notes.

## 2023-11-12 ENCOUNTER — Other Ambulatory Visit: Payer: Self-pay

## 2023-11-12 DIAGNOSIS — I10 Essential (primary) hypertension: Secondary | ICD-10-CM | POA: Diagnosis not present

## 2023-11-12 DIAGNOSIS — Z86711 Personal history of pulmonary embolism: Secondary | ICD-10-CM | POA: Diagnosis not present

## 2023-11-12 DIAGNOSIS — E7800 Pure hypercholesterolemia, unspecified: Secondary | ICD-10-CM | POA: Diagnosis not present

## 2023-11-12 DIAGNOSIS — M25562 Pain in left knee: Secondary | ICD-10-CM | POA: Diagnosis not present

## 2023-11-12 DIAGNOSIS — D472 Monoclonal gammopathy: Secondary | ICD-10-CM | POA: Diagnosis not present

## 2023-11-12 DIAGNOSIS — Z7901 Long term (current) use of anticoagulants: Secondary | ICD-10-CM | POA: Diagnosis not present

## 2023-11-12 DIAGNOSIS — M5459 Other low back pain: Secondary | ICD-10-CM | POA: Diagnosis not present

## 2023-11-12 DIAGNOSIS — Z7951 Long term (current) use of inhaled steroids: Secondary | ICD-10-CM | POA: Diagnosis not present

## 2023-11-12 DIAGNOSIS — M25561 Pain in right knee: Secondary | ICD-10-CM | POA: Diagnosis not present

## 2023-11-16 ENCOUNTER — Telehealth (HOSPITAL_COMMUNITY): Payer: Self-pay | Admitting: Cardiology

## 2023-11-16 ENCOUNTER — Other Ambulatory Visit: Payer: Self-pay | Admitting: Cardiology

## 2023-11-16 DIAGNOSIS — I361 Nonrheumatic tricuspid (valve) insufficiency: Secondary | ICD-10-CM

## 2023-11-16 DIAGNOSIS — I5042 Chronic combined systolic (congestive) and diastolic (congestive) heart failure: Secondary | ICD-10-CM

## 2023-11-16 DIAGNOSIS — I340 Nonrheumatic mitral (valve) insufficiency: Secondary | ICD-10-CM

## 2023-11-16 LAB — MULTIPLE MYELOMA PANEL, SERUM
Albumin SerPl Elph-Mcnc: 3.9 g/dL (ref 2.9–4.4)
Albumin/Glob SerPl: 1.6 (ref 0.7–1.7)
Alpha 1: 0.2 g/dL (ref 0.0–0.4)
Alpha2 Glob SerPl Elph-Mcnc: 0.7 g/dL (ref 0.4–1.0)
B-Globulin SerPl Elph-Mcnc: 0.9 g/dL (ref 0.7–1.3)
Gamma Glob SerPl Elph-Mcnc: 0.7 g/dL (ref 0.4–1.8)
Globulin, Total: 2.6 g/dL (ref 2.2–3.9)
IgA: 111 mg/dL (ref 64–422)
IgG (Immunoglobin G), Serum: 707 mg/dL (ref 586–1602)
IgM (Immunoglobulin M), Srm: 61 mg/dL (ref 26–217)
Total Protein ELP: 6.5 g/dL (ref 6.0–8.5)

## 2023-11-16 NOTE — Telephone Encounter (Signed)
 Pt aware and voiced understanding   Will have labs repeated at [facility] next week  Order placed

## 2023-11-16 NOTE — Telephone Encounter (Signed)
 Patient called to report great response with two med day 10/13 (farxiga  and lasix ), weight decreased x 5 lbs over night   Reports great bowel movement 10/13 with both meds  Reports she still feels some fluid  still in abdomen, would like to know if she can take another lasix  today  Weight 10/12 170 Weight 10/13 167 Weight 10/14 167.8  Denies swelling,SOB,cough, or CP  Reports bloating as only symptom   Please advise

## 2023-11-17 LAB — UPEP/UIFE/LIGHT CHAINS/TP, 24-HR UR
% BETA, Urine: 0 %
ALPHA 1 URINE: 0 %
Albumin, U: 100 %
Alpha 2, Urine: 0 %
Free Kappa Lt Chains,Ur: 18.64 mg/L (ref 1.17–86.46)
Free Kappa/Lambda Ratio: 7.01 (ref 1.83–14.26)
Free Lambda Lt Chains,Ur: 2.66 mg/L (ref 0.27–15.21)
GAMMA GLOBULIN URINE: 0 %
Total Protein, Urine-Ur/day: 87 mg/(24.h) (ref 30–150)
Total Protein, Urine: 8.9 mg/dL
Total Volume: 975

## 2023-11-23 ENCOUNTER — Ambulatory Visit (HOSPITAL_COMMUNITY): Payer: Self-pay | Admitting: Family Medicine

## 2023-11-23 ENCOUNTER — Ambulatory Visit (HOSPITAL_COMMUNITY)
Admission: RE | Admit: 2023-11-23 | Discharge: 2023-11-23 | Disposition: A | Discharge status: Home / Self Care | Source: Ambulatory Visit | Attending: Cardiology | Admitting: Cardiology

## 2023-11-23 ENCOUNTER — Ambulatory Visit (HOSPITAL_COMMUNITY)
Admission: RE | Admit: 2023-11-23 | Discharge: 2023-11-23 | Disposition: A | Discharge status: Home / Self Care | Source: Ambulatory Visit | Attending: Physician Assistant | Admitting: Physician Assistant

## 2023-11-23 DIAGNOSIS — D472 Monoclonal gammopathy: Secondary | ICD-10-CM | POA: Insufficient documentation

## 2023-11-23 DIAGNOSIS — I5042 Chronic combined systolic (congestive) and diastolic (congestive) heart failure: Secondary | ICD-10-CM | POA: Diagnosis present

## 2023-11-23 LAB — BASIC METABOLIC PANEL WITH GFR
Anion gap: 6 (ref 5–15)
BUN: 15 mg/dL (ref 8–23)
CO2: 26 mmol/L (ref 22–32)
Calcium: 9.1 mg/dL (ref 8.9–10.3)
Chloride: 108 mmol/L (ref 98–111)
Creatinine, Ser: 0.84 mg/dL (ref 0.44–1.00)
GFR, Estimated: >60 mL/min (ref >60)
Glucose, Bld: 98 mg/dL (ref 70–99)
Potassium: 3.8 mmol/L (ref 3.5–5.1)
Sodium: 140 mmol/L (ref 135–145)

## 2023-11-25 ENCOUNTER — Telehealth: Payer: Self-pay | Admitting: *Deleted

## 2023-11-25 DIAGNOSIS — D485 Neoplasm of uncertain behavior of skin: Secondary | ICD-10-CM | POA: Diagnosis not present

## 2023-11-25 DIAGNOSIS — C44529 Squamous cell carcinoma of skin of other part of trunk: Secondary | ICD-10-CM | POA: Diagnosis not present

## 2023-11-25 DIAGNOSIS — L821 Other seborrheic keratosis: Secondary | ICD-10-CM | POA: Diagnosis not present

## 2023-11-25 DIAGNOSIS — L570 Actinic keratosis: Secondary | ICD-10-CM | POA: Diagnosis not present

## 2023-11-25 DIAGNOSIS — B078 Other viral warts: Secondary | ICD-10-CM | POA: Diagnosis not present

## 2023-11-25 DIAGNOSIS — Z85828 Personal history of other malignant neoplasm of skin: Secondary | ICD-10-CM | POA: Diagnosis not present

## 2023-11-25 NOTE — Telephone Encounter (Signed)
 Received vm message from pt inquiring about recent lab results from  11/12/23 and 11/10/23  Please advise

## 2023-11-26 ENCOUNTER — Telehealth: Payer: Self-pay | Admitting: Physician Assistant

## 2023-11-26 ENCOUNTER — Telehealth: Payer: Self-pay

## 2023-11-26 DIAGNOSIS — K760 Fatty (change of) liver, not elsewhere classified: Secondary | ICD-10-CM | POA: Diagnosis not present

## 2023-11-26 DIAGNOSIS — E7800 Pure hypercholesterolemia, unspecified: Secondary | ICD-10-CM | POA: Diagnosis not present

## 2023-11-26 DIAGNOSIS — R7303 Prediabetes: Secondary | ICD-10-CM | POA: Diagnosis not present

## 2023-11-26 DIAGNOSIS — E042 Nontoxic multinodular goiter: Secondary | ICD-10-CM | POA: Diagnosis not present

## 2023-11-26 DIAGNOSIS — E559 Vitamin D deficiency, unspecified: Secondary | ICD-10-CM | POA: Diagnosis not present

## 2023-11-26 DIAGNOSIS — I272 Pulmonary hypertension, unspecified: Secondary | ICD-10-CM | POA: Diagnosis not present

## 2023-11-26 DIAGNOSIS — M810 Age-related osteoporosis without current pathological fracture: Secondary | ICD-10-CM | POA: Diagnosis not present

## 2023-11-26 DIAGNOSIS — Z0000 Encounter for general adult medical examination without abnormal findings: Secondary | ICD-10-CM | POA: Diagnosis not present

## 2023-11-26 DIAGNOSIS — Z6830 Body mass index (BMI) 30.0-30.9, adult: Secondary | ICD-10-CM | POA: Diagnosis not present

## 2023-11-26 NOTE — Telephone Encounter (Signed)
 Faxed Bone met survey to [phone] confirmation was confirmed.

## 2023-11-28 DIAGNOSIS — Z23 Encounter for immunization: Secondary | ICD-10-CM | POA: Diagnosis not present

## 2023-11-29 DIAGNOSIS — Z85828 Personal history of other malignant neoplasm of skin: Secondary | ICD-10-CM | POA: Diagnosis not present

## 2023-11-29 DIAGNOSIS — M170 Bilateral primary osteoarthritis of knee: Secondary | ICD-10-CM | POA: Diagnosis not present

## 2023-11-29 DIAGNOSIS — L918 Other hypertrophic disorders of the skin: Secondary | ICD-10-CM | POA: Diagnosis not present

## 2023-11-30 NOTE — Telephone Encounter (Signed)
 I called [name] to review the lab results from 11/10/2023. Results are most consistent with MGUS with evidence of urine immunofixation that shows bence jones protein, kappa type. SPEP/IFE and serum free light chains were unremarkable. No evidence of cytopenias, renal dysfunction or hypercalcemia. Bone met survey showed no evidence of lytic lesions. No further hematologic workup required and we will monitor patient with repeat labs/urine studies every 6 months.   Patient has ongoing mid back pain and is scheduled for a follow up with Dr. Heide at [facility] on 11/29/2023.

## 2023-12-20 DIAGNOSIS — I517 Cardiomegaly: Secondary | ICD-10-CM | POA: Diagnosis not present

## 2023-12-20 DIAGNOSIS — R300 Dysuria: Secondary | ICD-10-CM | POA: Diagnosis not present

## 2023-12-20 DIAGNOSIS — I272 Pulmonary hypertension, unspecified: Secondary | ICD-10-CM | POA: Diagnosis not present

## 2023-12-20 DIAGNOSIS — D472 Monoclonal gammopathy: Secondary | ICD-10-CM | POA: Diagnosis not present

## 2023-12-20 DIAGNOSIS — B002 Herpesviral gingivostomatitis and pharyngotonsillitis: Secondary | ICD-10-CM | POA: Diagnosis not present

## 2023-12-20 DIAGNOSIS — Z6830 Body mass index (BMI) 30.0-30.9, adult: Secondary | ICD-10-CM | POA: Diagnosis not present

## 2023-12-20 DIAGNOSIS — N952 Postmenopausal atrophic vaginitis: Secondary | ICD-10-CM | POA: Diagnosis not present

## 2023-12-23 ENCOUNTER — Telehealth (HOSPITAL_COMMUNITY): Payer: Self-pay

## 2023-12-23 NOTE — Telephone Encounter (Signed)
 Called to confirm/remind patient of their appointment at the [facility] on 12/24/23.   Appointment:   [x] Confirmed  [] Left mess   [] No answer/No voice mail  [] VM Full/unable to leave message  [] Phone not in service  Patient reminded to bring all medications and/or complete list.  Confirmed patient has transportation. Gave directions, instructed to utilize valet parking.

## 2023-12-24 ENCOUNTER — Ambulatory Visit (HOSPITAL_COMMUNITY)
Admission: RE | Admit: 2023-12-24 | Discharge: 2023-12-24 | Disposition: A | Discharge status: Home / Self Care | Source: Ambulatory Visit | Attending: Family Medicine | Admitting: Family Medicine

## 2023-12-24 VITALS — BP 124/70 | HR 62 | Wt 171.0 lb

## 2023-12-24 DIAGNOSIS — I5032 Chronic diastolic (congestive) heart failure: Secondary | ICD-10-CM | POA: Diagnosis present

## 2023-12-24 DIAGNOSIS — I272 Pulmonary hypertension, unspecified: Secondary | ICD-10-CM | POA: Diagnosis not present

## 2023-12-24 DIAGNOSIS — Z7901 Long term (current) use of anticoagulants: Secondary | ICD-10-CM | POA: Diagnosis not present

## 2023-12-24 DIAGNOSIS — Z79899 Other long term (current) drug therapy: Secondary | ICD-10-CM | POA: Insufficient documentation

## 2023-12-24 DIAGNOSIS — Z86711 Personal history of pulmonary embolism: Secondary | ICD-10-CM | POA: Insufficient documentation

## 2023-12-24 DIAGNOSIS — Z87440 Personal history of urinary (tract) infections: Secondary | ICD-10-CM | POA: Diagnosis not present

## 2023-12-24 DIAGNOSIS — I498 Other specified cardiac arrhythmias: Secondary | ICD-10-CM | POA: Insufficient documentation

## 2023-12-24 DIAGNOSIS — Z86718 Personal history of other venous thrombosis and embolism: Secondary | ICD-10-CM | POA: Insufficient documentation

## 2023-12-24 DIAGNOSIS — D472 Monoclonal gammopathy: Secondary | ICD-10-CM | POA: Insufficient documentation

## 2023-12-24 DIAGNOSIS — I517 Cardiomegaly: Secondary | ICD-10-CM | POA: Diagnosis not present

## 2023-12-24 NOTE — Progress Notes (Signed)
 PCP: Marvene Prentice SAUNDERS, FNP Cardiology: Dr. Elmira HF Cardiology: Dr. Rolan  78 y.o. with history of DVT/PE post-op in 2/24, pulmonary hypertension and diastolic CHF was referred by Dr. Elmira for evaluation of pulmonary hypertension. Patient had Nissen fundoplication in 2/24, post-op she had a DVT and PE.  She was started on apixaban  at that time and has remained on apixaban  since then.  Earlier this year, she developed a UTI and started drinking Gatorade as she was told she was dehydrated and hyponatremic after an ER visit in 3/25.  Echo was done in 3/25, showing EF 60-65%, mild RV enlargement with normal RV systolic function, PASP 43 mmHg, severe LAE, mild MR, moderate TR, normal IVC.  In 6/25, she was seen in the ER for peripheral edema and dyspnea, Lasix  20 mg daily was started. She saw Dr. Elmira and was set up for RHC in 7/25.  This showed normal filling pressures (she had been on Lasix  for several weeks) as well as mild pulmonary hypertension with relatively low PVR 2.6 WU.    PYP 10/25 not suggestive of cardiac amyloidosis, Grade 0. Multiple myeloma panel negative for M Spike, IFE  positive for Bence Jones protein with faint monoclonal bands. Following with Heme/Onc for MGUS work up.  Today she returns for HF follow up with her husband. Overall feeling fine. No undue dyspnea with activity, does well walking up stairs. Feels she is holding fluid in hands. Denies palpitations, abnormal bleeding, CP, dizziness, or PND/Orthopnea. Appetite ok. Weight at home 164-167 pounds. Taking all medications, urinates briskly on Lasix  days. She has tingling/burning in her feet, has a stimulator device at home that is helpful with symptoms. Admits to feeling anxious about heart pumping function.  ReDs reading: 30 %, normal  ECG (personally reviewed): none ordered today.  Labs (2/25): TSH normal Labs (6/25): BNP 156, K 4.5, creatinine 0.84 Labs (7/25): BNP 96, ANA negative, anti-centromere Ab  negative, anti-SCL-70 negative, RF negative Labs (8/25): K 4.1, creatinine 1.28 Labs (8/25): MM panel negative for M spike, + immunofixation with Bence Jones protein (kappa type) Labs (10/25): K 3.8, creatinine 0.84  PMH: 1. DVT/PE: 2/24, s/p Nissen fundoplication surgery.  - V/Q scan (8/25): No chronic PEs 2. Hyperlipidemia 3. Diastolic CHF/pulmonary hypertension:  - Echo (3/25): EF 60-65%, mild RV enlargement with normal RV systolic function, PASP 43 mmHg, severe LAE, mild MR, moderate TR, normal IVC.  - RHC (7/25): mean RA 5, PA 43/12 mean 27, mean PCWP 13, CI 2.8, PVR 2.6 WU, PAPi 6.2 4. Cardiac monitors in 3/23 and 11/23 showed no atrial fibrillation 5. PFTs (8/25): normal except for decreased DLCO.   Family History  Problem Relation Age of Onset   Other Mother 72       lived to be 81   Colon cancer Father    Clotting disorder Father    Deep vein thrombosis Father    Breast cancer Sister        mets   Social History   Socioeconomic History   Marital status: Married    Spouse name: Not on file   Number of children: 2   Years of education: Not on file   Highest education level: Not on file  Occupational History   Not on file  Tobacco Use   Smoking status: Never   Smokeless tobacco: Never  Vaping Use   Vaping status: Never Used  Substance and Sexual Activity   Alcohol  use: Yes    Comment: social, none  in the  last 6 months   Drug use: No   Sexual activity: Not on file  Other Topics Concern   Not on file  Social History Narrative   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   She has 2 children   Occasional alcohol  no tobacco or drug use   Social Drivers of Corporate Investment Banker Strain: Not on file  Food Insecurity: No Food Insecurity (11/10/2023)   Hunger Vital Sign    Worried About Running Out of Food in the Last Year: Never true    Ran Out of Food in the Last Year: Never true  Transportation Needs: No Transportation Needs (11/10/2023)   PRAPARE  - Administrator, Civil Service (Medical): No    Lack of Transportation (Non-Medical): No  Physical Activity: Not on file  Stress: Not on file  Social Connections: Unknown (06/17/2021)   Received [facility]   Social Network    Social Network: Not on file  Intimate Partner Violence: Not At Risk (11/10/2023)   Humiliation, Afraid, Rape, and Kick questionnaire    Fear of Current or Ex-Partner: No    Emotionally Abused: No    Physically Abused: No    Sexually Abused: No   ROS: All systems reviewed and negative except as per HPI.   Current Outpatient Medications  Medication Sig Dispense Refill   acetaminophen  (TYLENOL ) 500 MG tablet Take 1,000 mg by mouth every 8 (eight) hours as needed (pain.).     AMBULATORY NON FORMULARY MEDICATION Apply 1 patch topically at bedtime. Melatonin patch 6 mg with magnesium     AMBULATORY NON FORMULARY MEDICATION Take 2 capsules by mouth daily. UT Fix     AMBULATORY NON FORMULARY MEDICATION Take 2 tablets by mouth daily. Fish Oil Ultra EDA-DHA 1300 mg     apixaban  (ELIQUIS ) 5 MG TABS tablet TAKE 1 TABLET TWICE A DAY 180 tablet 1   Brimonidine Tartrate (LUMIFY) 0.025 % SOLN Place 1 drop into both eyes 2 (two) times daily as needed (dry/irritated eyes.).     carboxymethylcellulose (REFRESH PLUS) 0.5 % SOLN Place 1 drop into both eyes 3 (three) times daily as needed (dry/irritated eyes.).     cetirizine (ZYRTEC) 10 MG tablet Take 10 mg by mouth daily as needed (seasonal allergies.).     Cholecalciferol (VITAMIN D3) 1000 units CAPS Take 2,000 Units by mouth daily.     COLLAGEN PO Take 2 Scoops by mouth daily. Mix with coffee (Collagen Peptides)     dapagliflozin  propanediol (FARXIGA ) 10 MG TABS tablet Take 1 tablet (10 mg total) by mouth daily before breakfast. 90 tablet 0   estradiol (ESTRACE) 0.1 MG/GM vaginal cream Place 1 Applicatorful vaginally at bedtime.     fluorouracil (EFUDEX) 5 % cream Apply 1 Application topically 2 (two) times daily  as needed (skin lesions).     FOSAMAX 70 MG tablet Take 70 mg by mouth once a week.     furosemide  (LASIX ) 20 MG tablet Take 1 tablet (20 mg total) by mouth every other day. 90 tablet 2   hyoscyamine  (LEVSIN  SL) 0.125 MG SL tablet DISSOLVE 1 TABLET(0.125 MG) UNDER THE TONGUE EVERY 4 HOURS AS NEEDED FOR CRAMPING OR GAS OR BLOATING 60 tablet 5   METAMUCIL FIBER PO Take 10 mLs by mouth daily as needed.     metoprolol  succinate (TOPROL -XL) 25 MG 24 hr tablet TAKE 1 TABLET AT BEDTIME 90 tablet 3   METRONIDAZOLE , TOPICAL, 0.75 % LOTN Apply topically  daily.     mometasone (NASONEX) 50 MCG/ACT nasal spray Place 2 sprays into the nose daily as needed (Allergies).     Multiple Vitamin (MULTIVITAMIN) capsule Take 1 capsule by mouth daily.     OVER THE COUNTER MEDICATION Take 2 capsules by mouth in the morning. OTC: Gut Restore Ultimate probiotic     rosuvastatin (CRESTOR) 10 MG tablet Take 10 mg by mouth at bedtime.     tretinoin (RETIN-A) 0.05 % cream Apply 1 application  topically daily as needed (rosacea.).     No current facility-administered medications for this encounter.   Wt Readings from Last 3 Encounters:  12/24/23 77.6 kg (171 lb)  11/04/23 78.5 kg (173 lb)  11/02/23 76.7 kg (169 lb)   BP 124/70   Pulse 62   Wt 77.6 kg (171 lb)   SpO2 98%   BMI 28.46 kg/m   Physical Exam General:  NAD. No resp difficulty, walked into clinic, appears younger than stated age HEENT: Normal Neck: Supple. No JVD. Cor: Regular rate & rhythm. No rubs, gallops or murmurs. Lungs: Clear Abdomen: Soft, nontender, nondistended.  Extremities: No cyanosis, clubbing, rash, edema Neuro: Alert & oriented x 3, moves all 4 extremities w/o difficulty. Affect pleasant.   1. Chronic Diastolic CHF: Echo in 3/25 showed EF 60-65%, mild RV enlargement with normal RV systolic function, PASP 43 mmHg, severe LAE, mild MR, moderate TR, normal IVC. RHC in 7/25 after diuresis showed normal filling pressures (mean PCWP 13) with  mild pulmonary hypertension.  PVR was not significantly elevated at 2.6 WU, which would suggest most likely group 2 PH (pulmonary venous hypertension) in the setting of LV diastolic dysfunction.  The severely enlarged left atrium points towards significant LV diastolic dysfunction. No evidence for chronic PEs on 8/25 V/Q scan.  PYP was not suggestive of cardiac amyloidosis and multiple myeloma panel was negative for M spike. NYHA class I, not volume overloaded on exam.  Weight has trended down. ReDs normal at 30% - Continue Lasix  20 mg every other day. Recent labs reviewed and are stable. - Continue Farxiga  10 mg daily. No GU symptoms - Off Kerendia  with allergy (rash) - Update echo sooner, per patient's request. 2. H/o DVT/PE: This was post-surgery.  However, Eliquis  has been continued long term.  I think this is reasonable.  The severely enlarged LA is worrisome for AF risk (prior cardiac monitors have not showed AF) though could potentially be explained by severe diastolic dysfunction. No chronic PE on V/Q scan in 8/25.  - Agree with continuing Eliquis  long-term. No bleeding issues.  3. Ectopic atrial rhythm: Noted on 10/13/23 ECG, with short PR.  Stable rhythm.   - Continue to follow.  4. Monoclonal gammopathy: Following with Heme/Onc for MGUS work up - Bone survey reassuring.  Follow up in 6 months with Dr. Rolan Raisin [facility] FNP-BC 12/24/2023

## 2023-12-24 NOTE — Progress Notes (Signed)
 ReDS Vest / Clip - 12/24/23 1200       ReDS Vest / Clip   Station Marker B    Ruler Value 33.5    ReDS Value Range Low volume    ReDS Actual Value 30

## 2023-12-24 NOTE — Patient Instructions (Addendum)
 Good to see you today!  Your physician has requested that you have an echocardiogram. Echocardiography is a painless test that uses sound waves to create images of your heart. It provides your doctor with information about the size and shape of your heart and how well your heart's chambers and valves are working. This procedure takes approximately one hour. There are no restrictions for this procedure. Please do NOT wear cologne, perfume, aftershave, or lotions (deodorant is allowed). Please arrive 15 minutes prior to your appointment time.  Please note: We ask at that you not bring children with you during ultrasound (echo/ vascular) testing. Due to room size and safety concerns, children are not allowed in the ultrasound rooms during exams. Our front office staff cannot provide observation of children in our lobby area while testing is being conducted. An adult accompanying a patient to their appointment will only be allowed in the ultrasound room at the discretion of the ultrasound technician under special circumstances. We apologize for any inconvenience.  Your physician recommends that you schedule a follow-up appointment 6 months( May) Call office in March to schedule an appointment  If you have any questions or concerns before your next appointment please send us  a message through Helen or call our office at [phone].    TO LEAVE A MESSAGE FOR THE NURSE SELECT OPTION 2, PLEASE LEAVE A MESSAGE INCLUDING: YOUR NAME DATE OF BIRTH CALL BACK NUMBER REASON FOR CALL**this is important as we prioritize the call backs  YOU WILL RECEIVE A CALL BACK THE SAME DAY AS LONG AS YOU CALL BEFORE 4:00 PM At the [facility], you and your health needs are our priority. As part of our continuing mission to provide you with exceptional heart care, we have created designated Provider Care Teams. These Care Teams include your primary Cardiologist (physician) and Advanced Practice Providers  (APPs- Physician Assistants and Nurse Practitioners) who all work together to provide you with the care you need, when you need it.   You may see any of the following providers on your designated Care Team at your next follow up: Dr Toribio Fuel Dr Ezra Shuck Dr. Morene Brownie Greig Mosses, NP Caffie Shed, [place] [facility] [place], [place] Beckey Coe, NP Jordan Lee, NP Ellouise Class, NP Tinnie Redman, PharmD Jaun Bash, PharmD   Please be sure to bring in all your medications bottles to every appointment.    Thank you for choosing [facility]

## 2024-01-03 ENCOUNTER — Encounter (HOSPITAL_COMMUNITY)

## 2024-01-03 ENCOUNTER — Telehealth: Payer: Self-pay | Admitting: Internal Medicine

## 2024-01-03 NOTE — Telephone Encounter (Signed)
 She should see me in the office in the beginning of the year.  Tell her to call in January for an appointment in February or March.

## 2024-01-03 NOTE — Telephone Encounter (Signed)
 Patient requesting f/u call in regards to which she did not disclose. Please advise.

## 2024-01-03 NOTE — Telephone Encounter (Signed)
 I spoke with [name] and she said she has finally been released from her Drs after lots of testing. She had an EGD set up for 08/09/2023 that we cancelled due to her health at the time. She is going to have another ECHO done in December because she requested it for peace of mind. [place] She wants to have an EGD in the spring 2026 and questioned if she needs a colon. No colon recall in epic-last done 2023 with Dr Luis. She is still having dysphagia, with pills and solids. Hoarse as well. She wanted Dr Avram to review what has been done and advise a plan on moving forward.

## 2024-01-03 NOTE — Telephone Encounter (Signed)
 Patient informed and will call us  back in January to get an appointment for February or March.

## 2024-02-07 ENCOUNTER — Other Ambulatory Visit (HOSPITAL_COMMUNITY): Payer: Self-pay | Admitting: Cardiology

## 2024-02-07 ENCOUNTER — Encounter (HOSPITAL_COMMUNITY)

## 2024-02-09 ENCOUNTER — Ambulatory Visit (HOSPITAL_COMMUNITY)
Admission: RE | Admit: 2024-02-09 | Discharge: 2024-02-09 | Disposition: A | Discharge status: Home / Self Care | Source: Ambulatory Visit | Attending: Family Medicine | Admitting: Family Medicine

## 2024-02-09 ENCOUNTER — Ambulatory Visit (HOSPITAL_COMMUNITY): Payer: Self-pay | Admitting: Family Medicine

## 2024-02-09 DIAGNOSIS — I3481 Nonrheumatic mitral (valve) annulus calcification: Secondary | ICD-10-CM | POA: Insufficient documentation

## 2024-02-09 DIAGNOSIS — I371 Nonrheumatic pulmonary valve insufficiency: Secondary | ICD-10-CM | POA: Diagnosis not present

## 2024-02-09 DIAGNOSIS — I5032 Chronic diastolic (congestive) heart failure: Secondary | ICD-10-CM | POA: Diagnosis present

## 2024-02-09 DIAGNOSIS — I110 Hypertensive heart disease with heart failure: Secondary | ICD-10-CM | POA: Insufficient documentation

## 2024-02-09 DIAGNOSIS — K7689 Other specified diseases of liver: Secondary | ICD-10-CM | POA: Insufficient documentation

## 2024-02-09 DIAGNOSIS — I071 Rheumatic tricuspid insufficiency: Secondary | ICD-10-CM | POA: Diagnosis not present

## 2024-02-09 LAB — ECHOCARDIOGRAM COMPLETE
Area-P 1/2: 4.33 cm2
S' Lateral: 3 cm

## 2024-02-10 ENCOUNTER — Telehealth (HOSPITAL_COMMUNITY): Payer: Self-pay | Admitting: *Deleted

## 2024-02-10 NOTE — Telephone Encounter (Signed)
 Called patient per Harlene Gainer, NP with following:  Stable echo, EF 60-65%, RV ok, known liver cyst present (stable size compared to prior echo), stable valves. Good news! Please call  Pt verbalized understanding and appreciation of same.

## 2024-03-22 ENCOUNTER — Ambulatory Visit: Admitting: Internal Medicine

## 2024-03-30 ENCOUNTER — Ambulatory Visit: Admitting: Cardiology

## 2024-04-10 ENCOUNTER — Other Ambulatory Visit (HOSPITAL_COMMUNITY)

## 2024-05-10 ENCOUNTER — Other Ambulatory Visit

## 2024-05-24 ENCOUNTER — Ambulatory Visit: Admitting: Physician Assistant
